# Patient Record
Sex: Female | Born: 1965
Health system: Southern US, Community
[De-identification: ages and names within clinical notes are randomized; demographics above are authoritative.]

## PROBLEM LIST (undated history)

## (undated) DIAGNOSIS — Z87440 Personal history of urinary (tract) infections: Secondary | ICD-10-CM

## (undated) DIAGNOSIS — M1710 Unilateral primary osteoarthritis, unspecified knee: Secondary | ICD-10-CM

## (undated) DIAGNOSIS — E785 Hyperlipidemia, unspecified: Secondary | ICD-10-CM

## (undated) DIAGNOSIS — E669 Obesity, unspecified: Secondary | ICD-10-CM

## (undated) DIAGNOSIS — M179 Osteoarthritis of knee, unspecified: Secondary | ICD-10-CM

## (undated) DIAGNOSIS — E739 Lactose intolerance, unspecified: Secondary | ICD-10-CM

## (undated) DIAGNOSIS — K219 Gastro-esophageal reflux disease without esophagitis: Secondary | ICD-10-CM

## (undated) DIAGNOSIS — D649 Anemia, unspecified: Secondary | ICD-10-CM

## (undated) DIAGNOSIS — F329 Major depressive disorder, single episode, unspecified: Secondary | ICD-10-CM

## (undated) DIAGNOSIS — F32A Depression, unspecified: Secondary | ICD-10-CM

## (undated) HISTORY — DX: Unilateral primary osteoarthritis, unspecified knee: M17.10

## (undated) HISTORY — DX: Lactose intolerance, unspecified: E73.9

## (undated) HISTORY — PX: FOOT SURGERY: SHX648

## (undated) HISTORY — PX: WISDOM TOOTH EXTRACTION: SHX21

## (undated) HISTORY — DX: Obesity, unspecified: E66.9

## (undated) HISTORY — PX: BREAST BIOPSY: SHX20

## (undated) HISTORY — DX: Gastro-esophageal reflux disease without esophagitis: K21.9

## (undated) HISTORY — DX: Osteoarthritis of knee, unspecified: M17.9

## (undated) HISTORY — DX: Hyperlipidemia, unspecified: E78.5

## (undated) HISTORY — DX: Depression, unspecified: F32.A

## (undated) HISTORY — DX: Major depressive disorder, single episode, unspecified: F32.9

---

## 1999-03-12 ENCOUNTER — Other Ambulatory Visit: Admission: RE | Admit: 1999-03-12 | Discharge: 1999-03-12 | Payer: Self-pay | Admitting: Obstetrics & Gynecology

## 2000-03-18 ENCOUNTER — Other Ambulatory Visit: Admission: RE | Admit: 2000-03-18 | Discharge: 2000-03-18 | Payer: Self-pay | Admitting: Obstetrics & Gynecology

## 2000-06-18 ENCOUNTER — Other Ambulatory Visit: Admission: RE | Admit: 2000-06-18 | Discharge: 2000-06-18 | Payer: Self-pay | Admitting: Obstetrics & Gynecology

## 2000-07-22 ENCOUNTER — Other Ambulatory Visit: Admission: RE | Admit: 2000-07-22 | Discharge: 2000-07-22 | Payer: Self-pay | Admitting: Obstetrics & Gynecology

## 2000-07-22 ENCOUNTER — Encounter (INDEPENDENT_AMBULATORY_CARE_PROVIDER_SITE_OTHER): Payer: Self-pay | Admitting: Specialist

## 2000-09-04 ENCOUNTER — Encounter: Admission: RE | Admit: 2000-09-04 | Discharge: 2000-09-04 | Payer: Self-pay | Admitting: Family Medicine

## 2000-09-04 ENCOUNTER — Encounter: Payer: Self-pay | Admitting: Family Medicine

## 2000-11-10 ENCOUNTER — Other Ambulatory Visit: Admission: RE | Admit: 2000-11-10 | Discharge: 2000-11-10 | Payer: Self-pay | Admitting: Obstetrics & Gynecology

## 2001-01-02 ENCOUNTER — Encounter (INDEPENDENT_AMBULATORY_CARE_PROVIDER_SITE_OTHER): Payer: Self-pay

## 2001-01-02 ENCOUNTER — Ambulatory Visit (HOSPITAL_COMMUNITY): Admission: RE | Admit: 2001-01-02 | Discharge: 2001-01-02 | Payer: Self-pay | Admitting: Obstetrics & Gynecology

## 2001-03-25 ENCOUNTER — Other Ambulatory Visit: Admission: RE | Admit: 2001-03-25 | Discharge: 2001-03-25 | Payer: Self-pay | Admitting: Obstetrics & Gynecology

## 2001-09-19 ENCOUNTER — Encounter: Payer: Self-pay | Admitting: Emergency Medicine

## 2001-09-19 ENCOUNTER — Emergency Department (HOSPITAL_COMMUNITY): Admission: EM | Admit: 2001-09-19 | Discharge: 2001-09-19 | Payer: Self-pay | Admitting: Emergency Medicine

## 2001-09-28 ENCOUNTER — Encounter: Admission: RE | Admit: 2001-09-28 | Discharge: 2001-10-22 | Payer: Self-pay | Admitting: Orthopedic Surgery

## 2001-10-24 ENCOUNTER — Ambulatory Visit: Admission: RE | Admit: 2001-10-24 | Discharge: 2001-10-24 | Payer: Self-pay | Admitting: Orthopedic Surgery

## 2001-10-24 ENCOUNTER — Encounter: Payer: Self-pay | Admitting: Orthopedic Surgery

## 2001-11-11 ENCOUNTER — Other Ambulatory Visit: Admission: RE | Admit: 2001-11-11 | Discharge: 2001-11-11 | Payer: Self-pay | Admitting: Obstetrics & Gynecology

## 2002-01-25 ENCOUNTER — Encounter: Payer: Self-pay | Admitting: Internal Medicine

## 2002-01-25 ENCOUNTER — Emergency Department (HOSPITAL_COMMUNITY): Admission: EM | Admit: 2002-01-25 | Discharge: 2002-01-25 | Payer: Self-pay | Admitting: Emergency Medicine

## 2002-03-05 ENCOUNTER — Encounter: Payer: Self-pay | Admitting: *Deleted

## 2002-03-05 ENCOUNTER — Ambulatory Visit (HOSPITAL_COMMUNITY): Admission: RE | Admit: 2002-03-05 | Discharge: 2002-03-05 | Payer: Self-pay | Admitting: *Deleted

## 2002-05-03 ENCOUNTER — Encounter: Payer: Self-pay | Admitting: Internal Medicine

## 2002-05-03 ENCOUNTER — Ambulatory Visit (HOSPITAL_COMMUNITY): Admission: RE | Admit: 2002-05-03 | Discharge: 2002-05-03 | Payer: Self-pay | Admitting: Internal Medicine

## 2002-05-03 ENCOUNTER — Encounter: Admission: RE | Admit: 2002-05-03 | Discharge: 2002-05-03 | Payer: Self-pay | Admitting: Internal Medicine

## 2002-05-26 ENCOUNTER — Ambulatory Visit (HOSPITAL_COMMUNITY): Admission: RE | Admit: 2002-05-26 | Discharge: 2002-05-26 | Payer: Self-pay | Admitting: *Deleted

## 2002-07-28 ENCOUNTER — Ambulatory Visit (HOSPITAL_COMMUNITY): Admission: RE | Admit: 2002-07-28 | Discharge: 2002-07-28 | Payer: Self-pay | Admitting: *Deleted

## 2002-09-13 ENCOUNTER — Ambulatory Visit (HOSPITAL_COMMUNITY): Admission: RE | Admit: 2002-09-13 | Discharge: 2002-09-13 | Payer: Self-pay | Admitting: *Deleted

## 2002-12-08 ENCOUNTER — Ambulatory Visit (HOSPITAL_COMMUNITY): Admission: RE | Admit: 2002-12-08 | Discharge: 2002-12-08 | Payer: Self-pay | Admitting: *Deleted

## 2003-01-03 ENCOUNTER — Encounter: Admission: RE | Admit: 2003-01-03 | Discharge: 2003-01-03 | Payer: Self-pay | Admitting: *Deleted

## 2003-01-09 ENCOUNTER — Inpatient Hospital Stay (HOSPITAL_COMMUNITY): Admission: AD | Admit: 2003-01-09 | Discharge: 2003-01-12 | Payer: Self-pay | Admitting: Obstetrics and Gynecology

## 2003-02-22 ENCOUNTER — Encounter: Admission: RE | Admit: 2003-02-22 | Discharge: 2003-02-22 | Payer: Self-pay | Admitting: Obstetrics and Gynecology

## 2003-02-24 ENCOUNTER — Encounter: Admission: RE | Admit: 2003-02-24 | Discharge: 2003-02-24 | Payer: Self-pay | Admitting: Obstetrics and Gynecology

## 2003-02-28 ENCOUNTER — Ambulatory Visit (HOSPITAL_COMMUNITY): Admission: RE | Admit: 2003-02-28 | Discharge: 2003-02-28 | Payer: Self-pay | Admitting: Obstetrics and Gynecology

## 2003-03-31 ENCOUNTER — Encounter: Admission: RE | Admit: 2003-03-31 | Discharge: 2003-03-31 | Payer: Self-pay | Admitting: Obstetrics and Gynecology

## 2003-08-22 ENCOUNTER — Encounter: Admission: RE | Admit: 2003-08-22 | Discharge: 2003-08-22 | Payer: Self-pay | Admitting: Internal Medicine

## 2003-08-29 ENCOUNTER — Encounter: Admission: RE | Admit: 2003-08-29 | Discharge: 2003-08-29 | Payer: Self-pay | Admitting: Internal Medicine

## 2003-09-05 ENCOUNTER — Ambulatory Visit (HOSPITAL_COMMUNITY): Admission: RE | Admit: 2003-09-05 | Discharge: 2003-09-05 | Payer: Self-pay | Admitting: Internal Medicine

## 2003-09-05 ENCOUNTER — Encounter: Admission: RE | Admit: 2003-09-05 | Discharge: 2003-09-05 | Payer: Self-pay | Admitting: Internal Medicine

## 2003-09-05 IMAGING — CR DG KNEE 1-2V*L*
2 series · 2 of 2 positions shown · non-contrast
Comparison: none

CLINICAL DATA: 38 year old with pain in the left knee.  
 LEFT KNEE, TWO VIEWS
 No priors.
 Two views are performed, showing mild degenerative change, affecting the medial, lateral and patellofemoral compartments.  There is no evidence for acute fracture or dislocation.  No joint effusion is noted.

[view not recorded (1 of 2)]
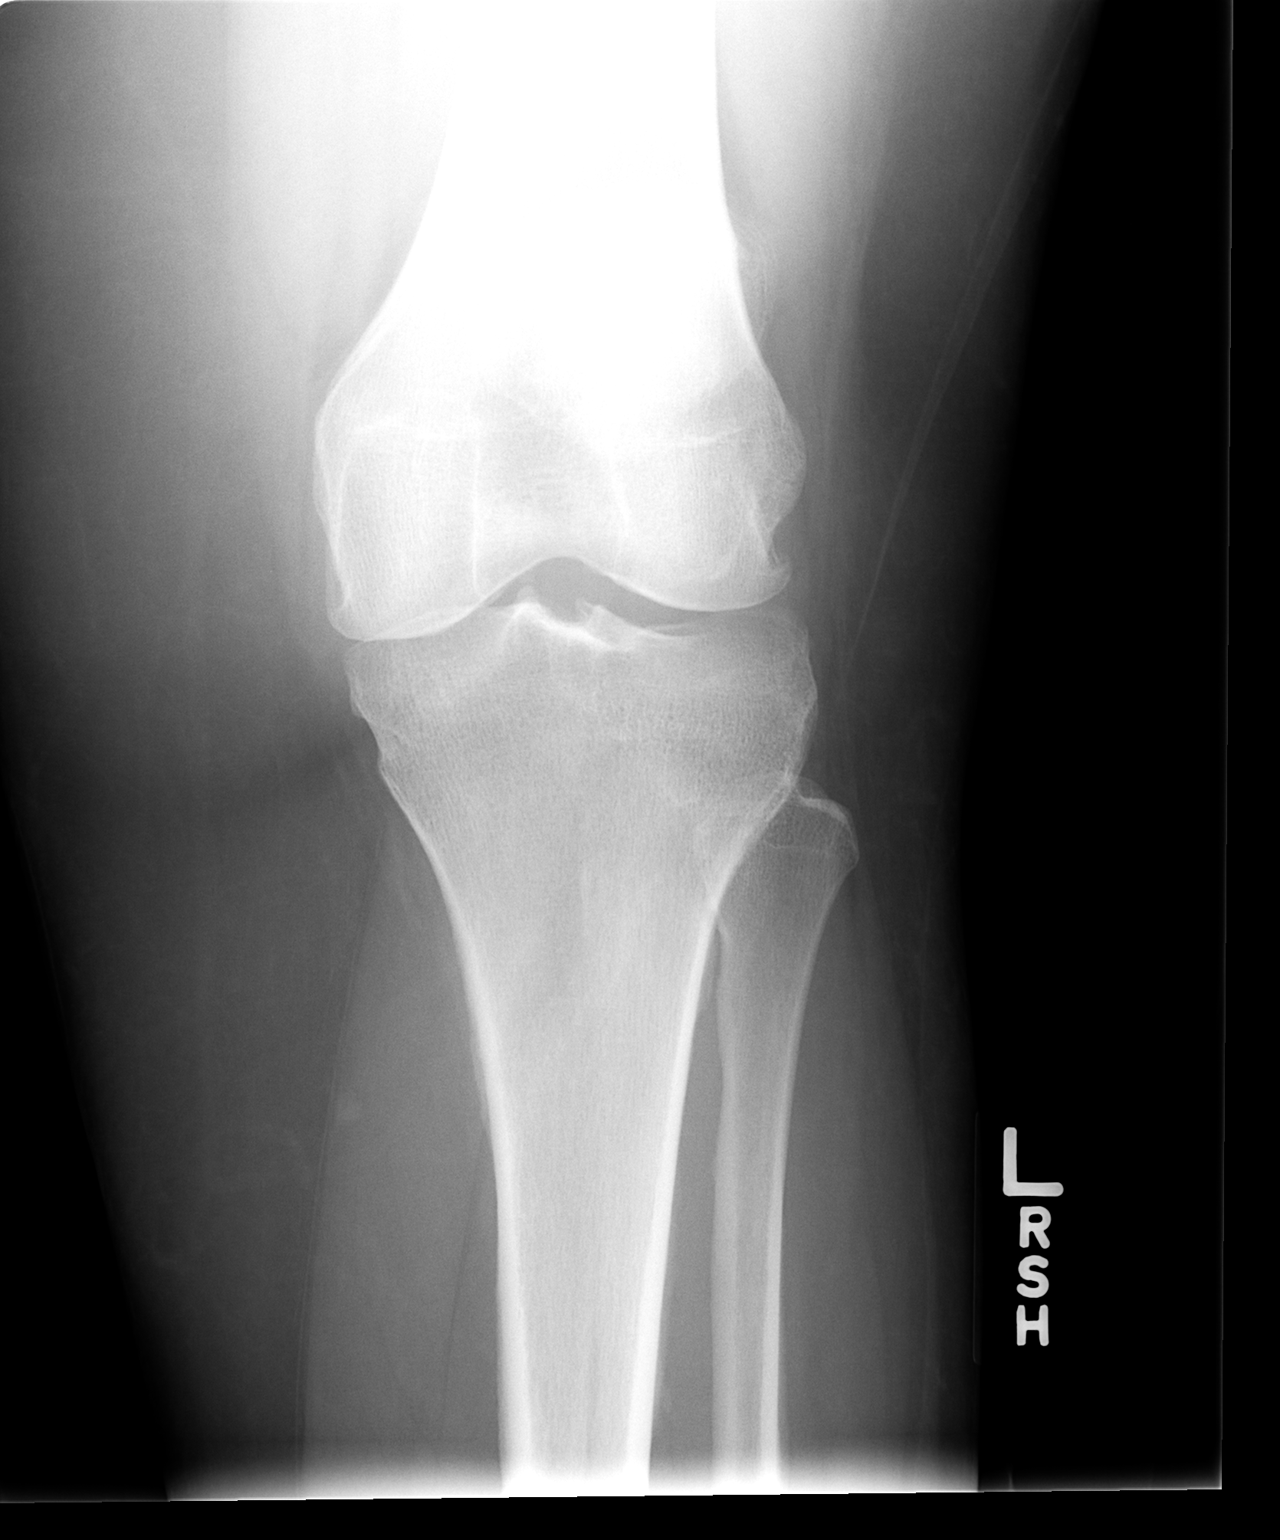

[view not recorded (2 of 2)]
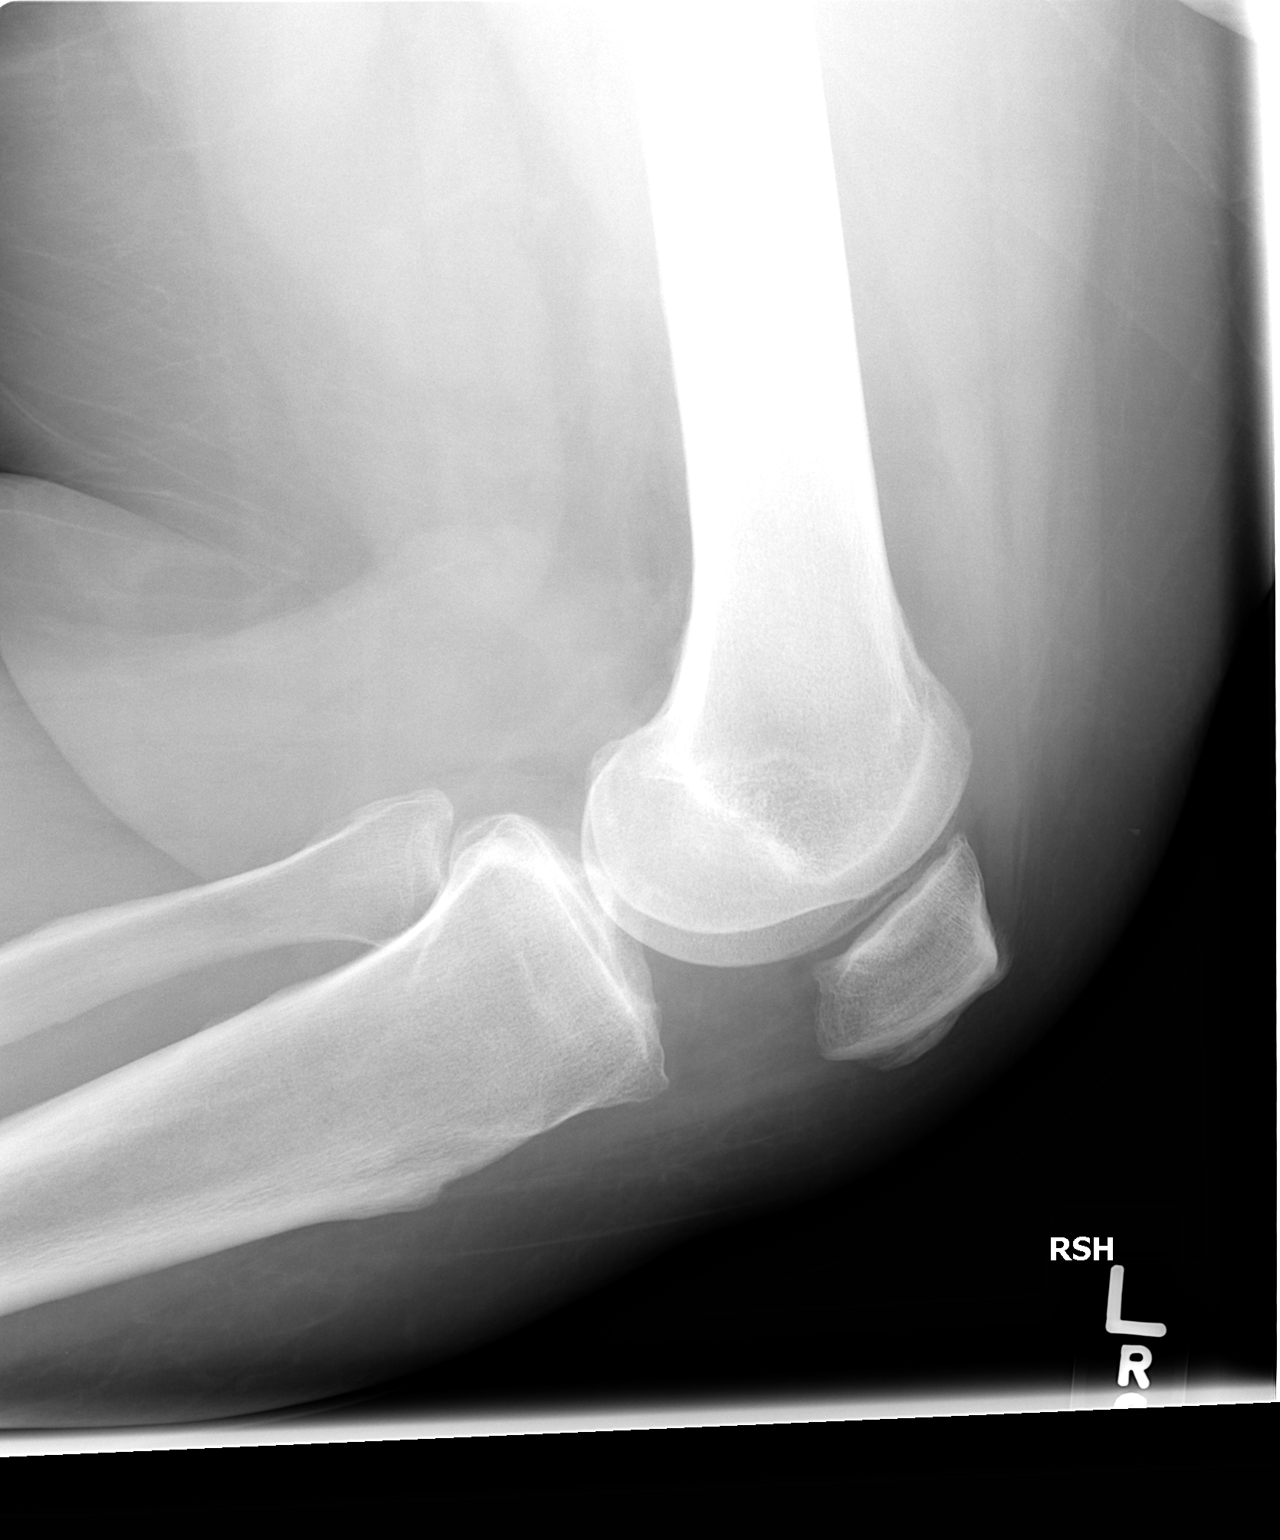

[2 of 2 positions shown; findings below may reference images not displayed]

IMPRESSION: Mild degenerative change without evidence for acute abnormality.

## 2003-09-19 ENCOUNTER — Encounter: Admission: RE | Admit: 2003-09-19 | Discharge: 2003-09-19 | Payer: Self-pay | Admitting: Internal Medicine

## 2003-09-27 ENCOUNTER — Encounter: Admission: RE | Admit: 2003-09-27 | Discharge: 2003-10-20 | Payer: Self-pay | Admitting: Internal Medicine

## 2003-11-10 ENCOUNTER — Encounter: Admission: RE | Admit: 2003-11-10 | Discharge: 2003-11-10 | Payer: Self-pay | Admitting: Internal Medicine

## 2003-11-11 ENCOUNTER — Encounter (INDEPENDENT_AMBULATORY_CARE_PROVIDER_SITE_OTHER): Payer: Self-pay | Admitting: *Deleted

## 2004-03-06 ENCOUNTER — Ambulatory Visit: Payer: Self-pay | Admitting: *Deleted

## 2005-01-09 ENCOUNTER — Ambulatory Visit: Payer: Self-pay | Admitting: Internal Medicine

## 2005-03-13 ENCOUNTER — Other Ambulatory Visit: Admission: RE | Admit: 2005-03-13 | Discharge: 2005-03-13 | Payer: Self-pay | Admitting: Obstetrics & Gynecology

## 2005-04-15 ENCOUNTER — Ambulatory Visit: Payer: Self-pay | Admitting: Internal Medicine

## 2005-05-29 ENCOUNTER — Ambulatory Visit: Payer: Self-pay | Admitting: Internal Medicine

## 2005-11-13 ENCOUNTER — Ambulatory Visit: Payer: Self-pay | Admitting: Internal Medicine

## 2005-12-05 ENCOUNTER — Emergency Department (HOSPITAL_COMMUNITY): Admission: EM | Admit: 2005-12-05 | Discharge: 2005-12-05 | Payer: Self-pay | Admitting: Emergency Medicine
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
## 2005-12-09 ENCOUNTER — Ambulatory Visit: Payer: Self-pay | Admitting: Internal Medicine

## 2005-12-18 ENCOUNTER — Ambulatory Visit: Payer: Self-pay | Admitting: Internal Medicine

## 2006-01-29 ENCOUNTER — Ambulatory Visit: Payer: Self-pay | Admitting: Internal Medicine

## 2006-02-19 ENCOUNTER — Ambulatory Visit: Payer: Self-pay | Admitting: Internal Medicine

## 2006-03-27 ENCOUNTER — Ambulatory Visit: Payer: Self-pay | Admitting: Internal Medicine

## 2006-04-17 ENCOUNTER — Ambulatory Visit: Payer: Self-pay | Admitting: Internal Medicine

## 2006-06-09 ENCOUNTER — Ambulatory Visit: Payer: Self-pay | Admitting: Internal Medicine

## 2006-06-25 ENCOUNTER — Encounter (INDEPENDENT_AMBULATORY_CARE_PROVIDER_SITE_OTHER): Payer: Self-pay | Admitting: *Deleted

## 2006-06-25 DIAGNOSIS — F329 Major depressive disorder, single episode, unspecified: Secondary | ICD-10-CM | POA: Insufficient documentation

## 2006-06-25 DIAGNOSIS — E785 Hyperlipidemia, unspecified: Secondary | ICD-10-CM | POA: Insufficient documentation

## 2006-06-25 DIAGNOSIS — E669 Obesity, unspecified: Secondary | ICD-10-CM | POA: Insufficient documentation

## 2006-06-25 DIAGNOSIS — M1990 Unspecified osteoarthritis, unspecified site: Secondary | ICD-10-CM | POA: Insufficient documentation

## 2006-06-25 DIAGNOSIS — F17200 Nicotine dependence, unspecified, uncomplicated: Secondary | ICD-10-CM | POA: Insufficient documentation

## 2006-07-11 ENCOUNTER — Ambulatory Visit: Payer: Self-pay | Admitting: Internal Medicine

## 2006-07-11 LAB — CONVERTED CEMR LAB
AST: 15 units/L (ref 0–37)
Albumin: 3.5 g/dL (ref 3.5–5.2)
Basophils Absolute: 0 10*3/uL (ref 0.0–0.1)
CO2: 27 meq/L (ref 19–32)
Chloride: 110 meq/L (ref 96–112)
Creatinine, Ser: 0.6 mg/dL (ref 0.4–1.2)
Eosinophils Absolute: 0.2 10*3/uL (ref 0.0–0.6)
Eosinophils Relative: 2.6 % (ref 0.0–5.0)
GFR calc Af Amer: 142 mL/min
Glucose, Bld: 90 mg/dL (ref 70–99)
HCT: 36.1 % (ref 36.0–46.0)
HDL: 55.5 mg/dL (ref >39.0)
Hemoglobin, Urine: NEGATIVE
Hemoglobin: 12.5 g/dL (ref 12.0–15.0)
LDL Cholesterol: 128 mg/dL — ABNORMAL HIGH (ref 0–99)
MCHC: 34.5 g/dL (ref 30.0–36.0)
MCV: 89.4 fL (ref 78.0–100.0)
Monocytes Absolute: 0.5 10*3/uL (ref 0.2–0.7)
Monocytes Relative: 7.4 % (ref 3.0–11.0)
Nitrite: NEGATIVE
RBC: 4.04 M/uL (ref 3.87–5.11)
RDW: 13.8 % (ref 11.5–14.6)
Total Bilirubin: 0.7 mg/dL (ref 0.3–1.2)
Total Protein, Urine: NEGATIVE mg/dL
Triglycerides: 68 mg/dL (ref 0–149)
VLDL: 14 mg/dL (ref 0–40)
WBC: 6.1 10*3/uL (ref 4.5–10.5)

## 2006-07-16 ENCOUNTER — Ambulatory Visit: Payer: Self-pay | Admitting: Internal Medicine

## 2006-10-01 IMAGING — US MAMMO-LUNI-US
1 series · 6 of 6 positions shown · non-contrast
Comparison: NONE

CLINICAL DATA: GIAMPIERRE, RT(R)(M)   
Diagnostic  Mammogram.  Six-month follow-up. 

LEFT BREAST MAMMOGRAM AND LEFT BREAST ULTRASOUND

[Series 1: us breast · 0.07mm/px · 6 of 6 slices shown]
[im 1/6]
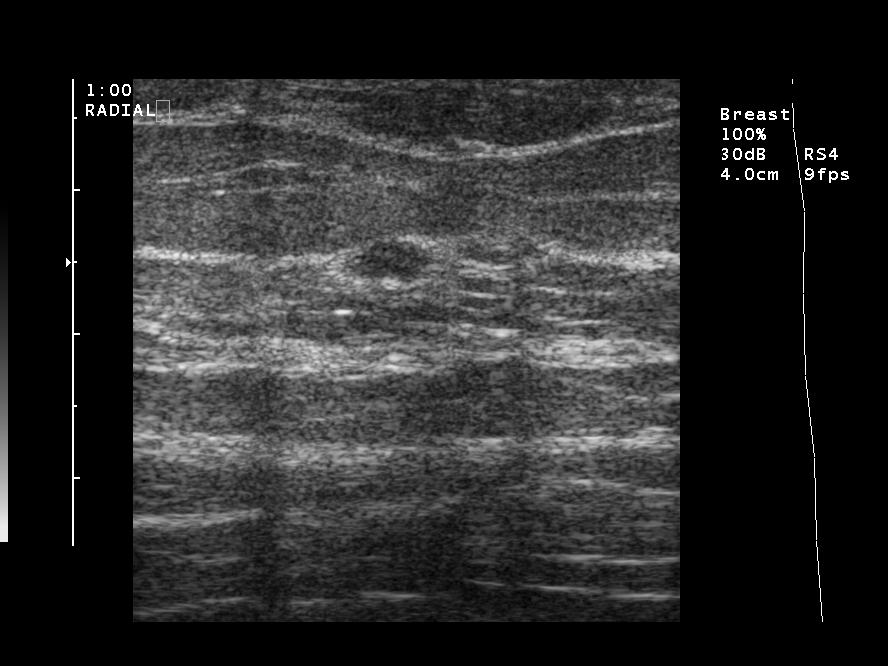
[im 2/6]
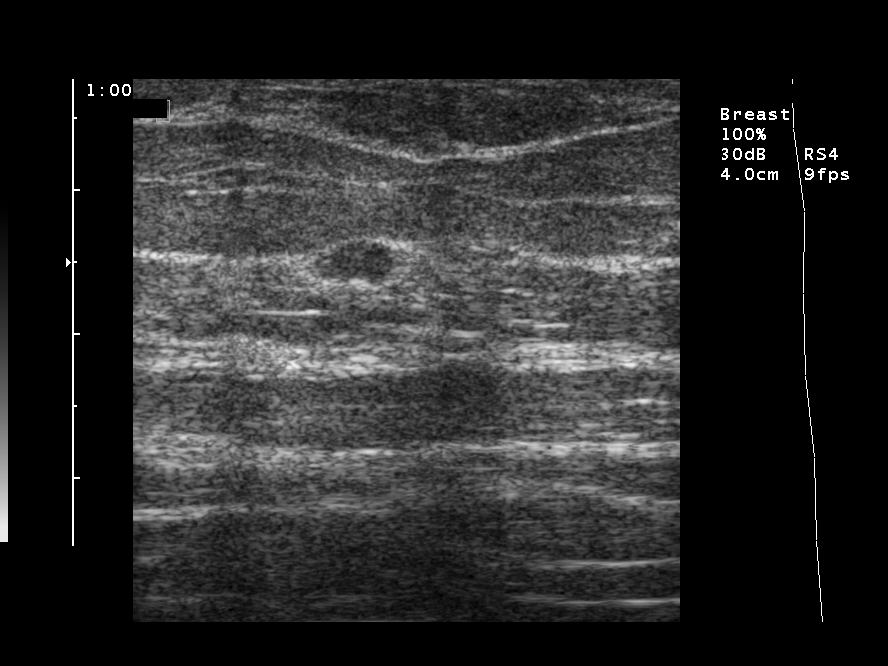
[im 3/6]
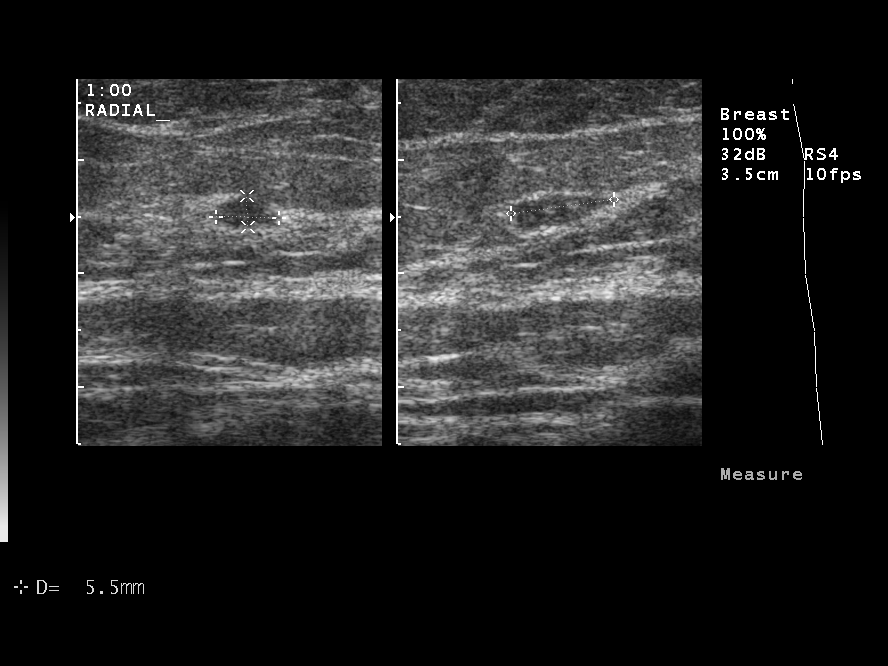
[im 4/6]
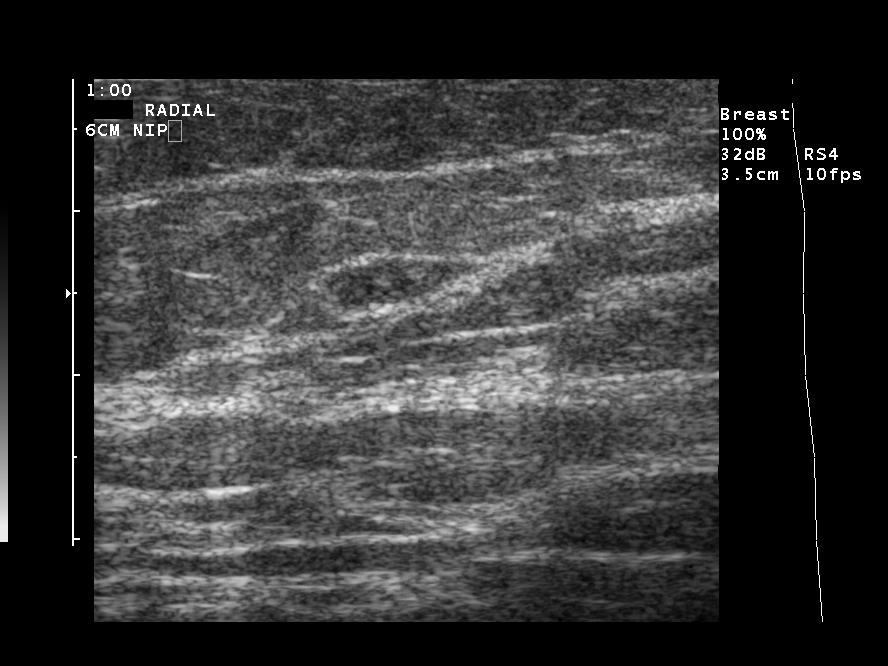
[im 5/6]
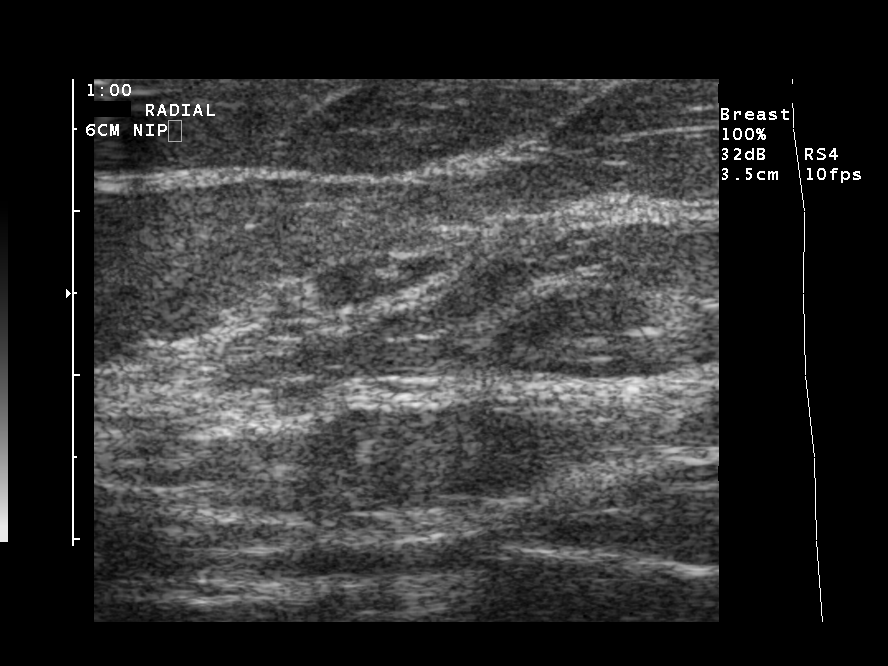
[im 6/6]
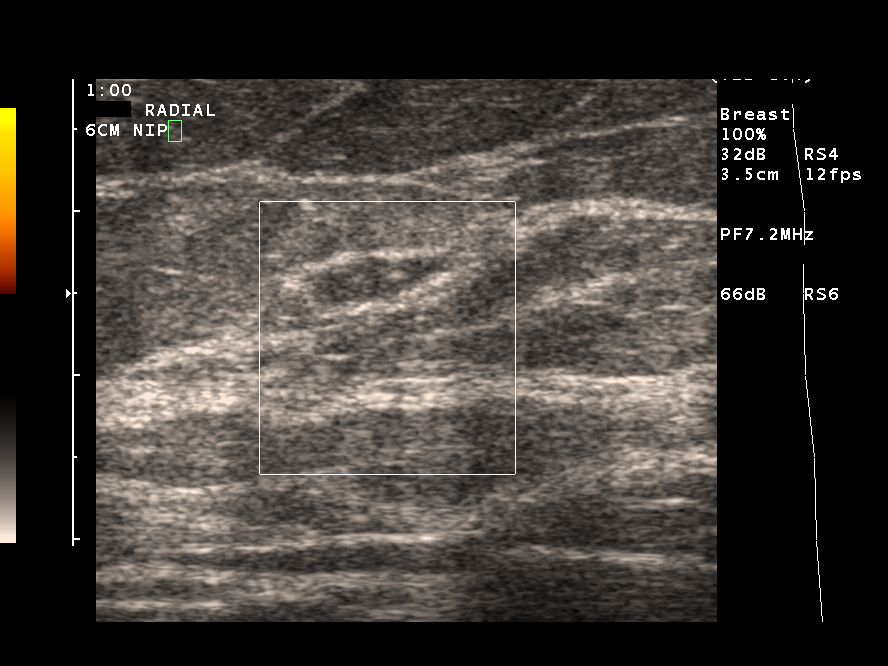

[6 of 6 positions shown; findings below may reference images not displayed]

FINDINGS: There is a prior mammogram dated [DATE] for 
comparison.  There is moderate parenchymal breast density.  There 
is a mass seen in the upper outer quadrant of the left breast.  
Margins are partially obscured.  This does not appear to be 
significantly changed compared to the prior mammogram.  Benign 
appearing calcifications are seen. Ultrasound was performed at the 
[DATE] position of the left breast.  There is an area located 6 cm 
from the nipple that is hypoechoic.  This is oval in shape and 
measures 6 x 3 x 9 mm.  There is a previous ultrasound dated 
[DATE].  At that time the lesion measured 9 x 5 x 5 mm.  There 
is no significant change in size of the mass.  There is no 
acoustic enhancement or shadowing.  This has a well circumscribed 
margin that is parallel to the skin surface.
IMPRESSION: No mammographic or ultrasonographic evidence of 
malignancy. I recommend six-month interval follow-up left breast 
mammogram and left breast ultrasound to re-evaluate the findings. 
The patient was informed at the time of the examination of the 
findings and recommendations by verbal and written lay report. 
Computer assisted (Second Look) technology was used as an aid in 
interpretation of this study. BI-RADS 3 - Probably Benign GIAMPIERRE 
[DATE]  Trans Date: [DATE] JH  GIAMPIERRE

## 2007-01-03 ENCOUNTER — Encounter: Payer: Self-pay | Admitting: Internal Medicine

## 2007-01-03 DIAGNOSIS — K219 Gastro-esophageal reflux disease without esophagitis: Secondary | ICD-10-CM | POA: Insufficient documentation

## 2007-01-03 DIAGNOSIS — J309 Allergic rhinitis, unspecified: Secondary | ICD-10-CM | POA: Insufficient documentation

## 2007-03-11 ENCOUNTER — Encounter: Payer: Self-pay | Admitting: Internal Medicine

## 2007-03-11 ENCOUNTER — Ambulatory Visit: Payer: Self-pay | Admitting: Internal Medicine

## 2007-03-11 DIAGNOSIS — R209 Unspecified disturbances of skin sensation: Secondary | ICD-10-CM | POA: Insufficient documentation

## 2007-03-11 DIAGNOSIS — F411 Generalized anxiety disorder: Secondary | ICD-10-CM | POA: Insufficient documentation

## 2007-03-12 ENCOUNTER — Ambulatory Visit: Payer: Self-pay | Admitting: Internal Medicine

## 2007-03-12 LAB — CONVERTED CEMR LAB
Albumin: 3.6 g/dL (ref 3.5–5.2)
Alkaline Phosphatase: 69 units/L (ref 39–117)
BUN: 7 mg/dL (ref 6–23)
Bilirubin Urine: NEGATIVE
CO2: 25 meq/L (ref 19–32)
Cholesterol: 195 mg/dL (ref 0–200)
Creatinine, Ser: 0.8 mg/dL (ref 0.4–1.2)
Eosinophils Relative: 2.7 % (ref 0.0–5.0)
GFR calc Af Amer: 102 mL/min
GFR calc non Af Amer: 84 mL/min
HCT: 37.4 % (ref 36.0–46.0)
HDL: 53.4 mg/dL (ref >39.0)
Ketones, ur: NEGATIVE mg/dL
LDL Cholesterol: 128 mg/dL — ABNORMAL HIGH (ref 0–99)
Lymphocytes Relative: 42.3 % (ref 12.0–46.0)
MCHC: 33.5 g/dL (ref 30.0–36.0)
Monocytes Relative: 8.6 % (ref 3.0–11.0)
Neutrophils Relative %: 45.8 % (ref 43.0–77.0)
Platelets: 226 10*3/uL (ref 150–400)
Specific Gravity, Urine: >=1.03 (ref 1.000–1.03)
Total Bilirubin: 0.9 mg/dL (ref 0.3–1.2)
Triglycerides: 66 mg/dL (ref 0–149)
Urine Glucose: NEGATIVE mg/dL
Urobilinogen, UA: 0.2 (ref 0.0–1.0)
WBC: 6.4 10*3/uL (ref 4.5–10.5)
pH: 5.5 (ref 5.0–8.0)

## 2007-03-18 IMAGING — US MAMMO-BILAT-US
1 series · 5 of 5 positions shown · non-contrast
Comparison: NONE

CLINICAL DATA: BARNHILL RT(R)(M)(CT)   Diagnostic 
Mammogram.   Six-month follow-up. 

BILATERAL MAMMOGRAM AND LEFT BREAST ULTRASOUND

[Series 1: us breast · 0.07mm/px · 5 of 5 slices shown]
[im 1/5]
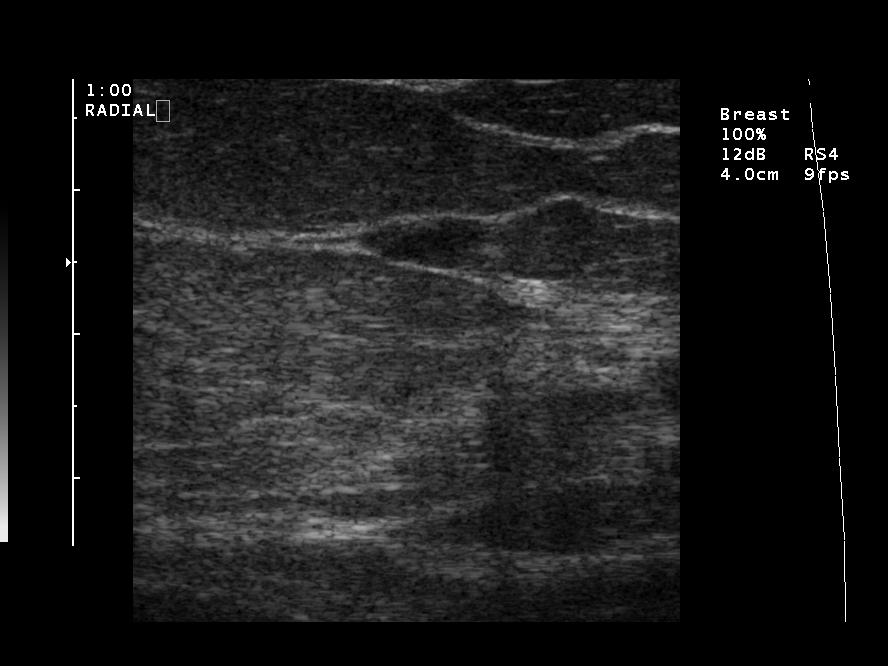
[im 2/5]
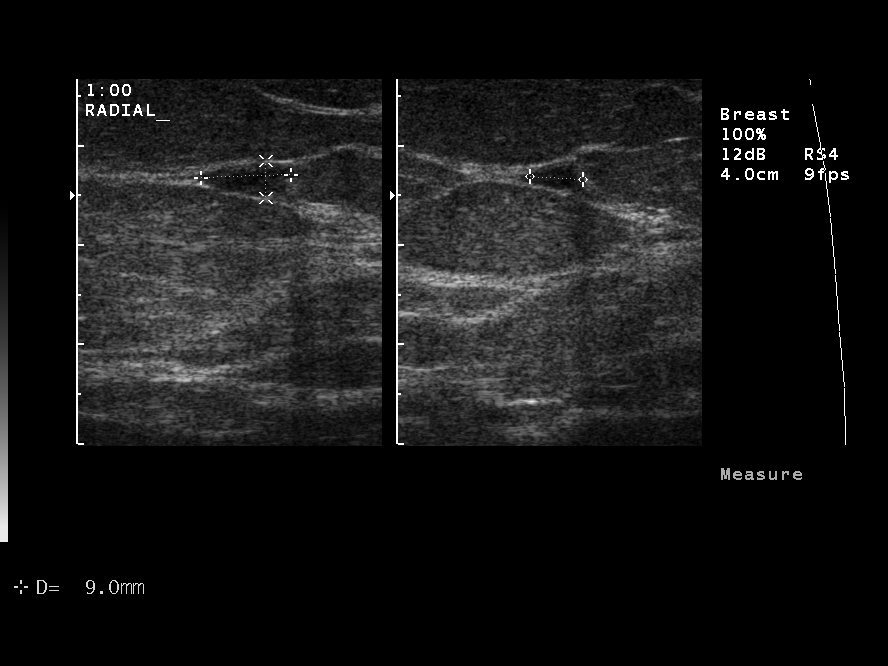
[im 3/5]
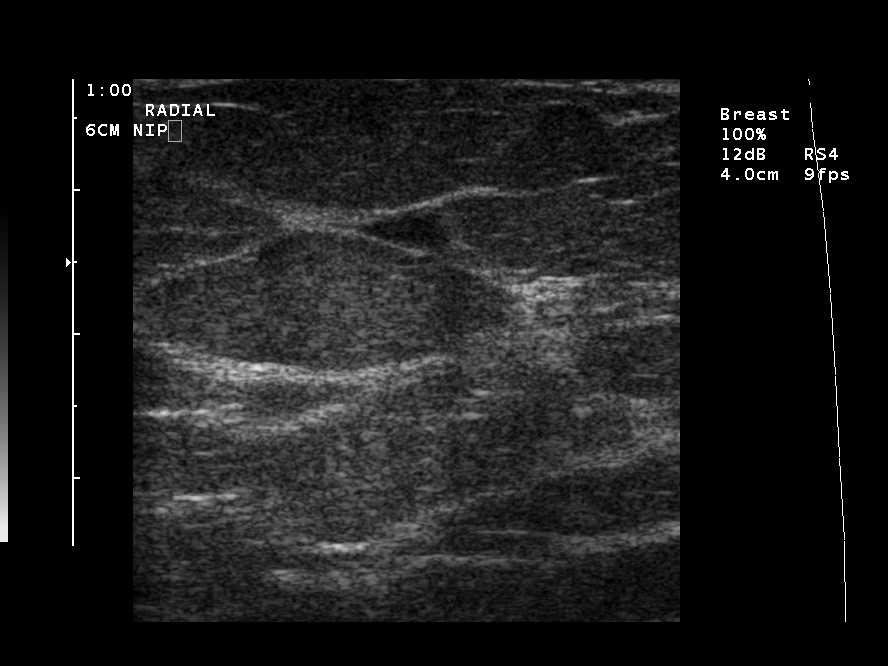
[im 4/5]
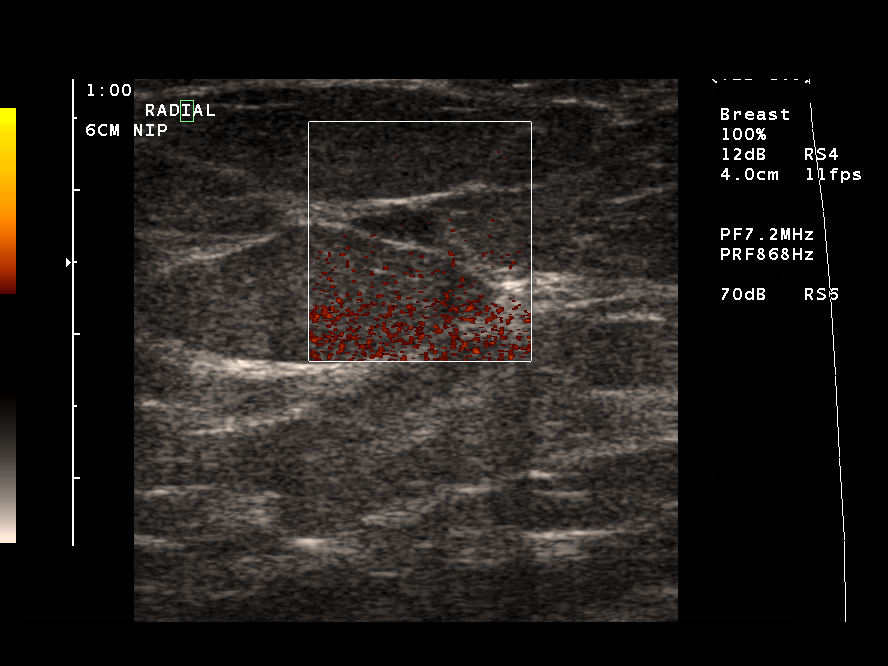
[im 5/5]
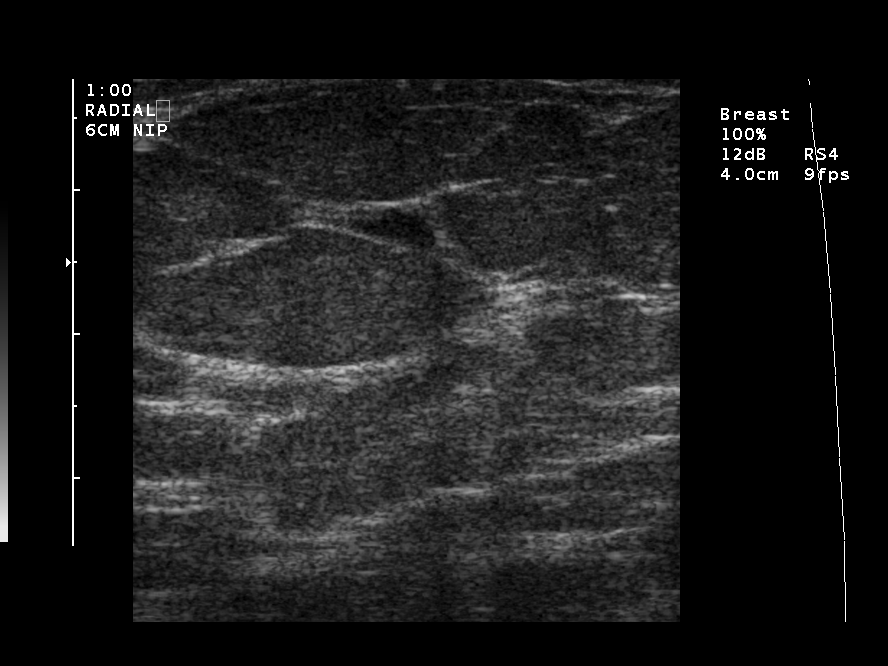

[5 of 5 positions shown; findings below may reference images not displayed]

FINDINGS: Bilateral mammogram is compared with left breast 
images dated [DATE] and bilateral study dated [DATE].  
Moderate glandular pattern is stable bilaterally.  Previously 
demonstrated small nodular density in the left breast at 
approximately the 1 o???clock position is unchanged in appearance.  
There are no suspicious features mammographically. Ultrasound 
re-evaluation of the left breast nodule noted in the 1 o???clock 
position, 6 cm from the nipple demonstrates a stable hypoechoic 
horizontally oriented nodule measuring 0.9 x 0.4 x 0.5 cm.  
Ultrasound echo appearances are consistent with a benign nodule.
IMPRESSION: Stable mammogram and left breast ultrasound 
demonstrating benign appearing nodule on the left.  Annual 
mammography is now recommended. The patient was informed at the 
time of the examination of the findings and recommendations by 
verbal and written lay report. Computer assisted (Second Look) 
technology was used as an aid in interpretation of this study. 
BI-RADS 2- Benign BARNHILL, M.D. electronically reviewed 
on [DATE] Dict Date: [DATE]  Tran Date: [DATE] CAV  
BARNHILL

## 2007-04-01 ENCOUNTER — Telehealth: Payer: Self-pay | Admitting: Internal Medicine

## 2007-06-22 ENCOUNTER — Telehealth (INDEPENDENT_AMBULATORY_CARE_PROVIDER_SITE_OTHER): Payer: Self-pay | Admitting: *Deleted

## 2007-06-24 ENCOUNTER — Ambulatory Visit: Payer: Self-pay | Admitting: Internal Medicine

## 2007-06-24 DIAGNOSIS — M25569 Pain in unspecified knee: Secondary | ICD-10-CM | POA: Insufficient documentation

## 2007-08-05 ENCOUNTER — Encounter: Payer: Self-pay | Admitting: Internal Medicine

## 2008-01-08 ENCOUNTER — Ambulatory Visit: Payer: Self-pay | Admitting: Internal Medicine

## 2008-01-08 DIAGNOSIS — J019 Acute sinusitis, unspecified: Secondary | ICD-10-CM | POA: Insufficient documentation

## 2008-01-29 ENCOUNTER — Telehealth: Payer: Self-pay | Admitting: Internal Medicine

## 2008-01-29 DIAGNOSIS — R220 Localized swelling, mass and lump, head: Secondary | ICD-10-CM | POA: Insufficient documentation

## 2008-01-29 DIAGNOSIS — R221 Localized swelling, mass and lump, neck: Secondary | ICD-10-CM

## 2008-02-19 ENCOUNTER — Encounter: Payer: Self-pay | Admitting: Internal Medicine

## 2008-03-14 ENCOUNTER — Ambulatory Visit: Payer: Self-pay | Admitting: Internal Medicine

## 2008-03-14 LAB — CONVERTED CEMR LAB
AST: 16 units/L (ref 0–37)
Albumin: 3.6 g/dL (ref 3.5–5.2)
Bilirubin Urine: NEGATIVE
Bilirubin, Direct: 0.2 mg/dL (ref 0.0–0.3)
CO2: 26 meq/L (ref 19–32)
Chloride: 108 meq/L (ref 96–112)
Cholesterol: 209 mg/dL (ref 0–200)
Creatinine, Ser: 0.7 mg/dL (ref 0.4–1.2)
Direct LDL: 130.5 mg/dL
Eosinophils Relative: 2.7 % (ref 0.0–5.0)
GFR calc non Af Amer: 98 mL/min
Glucose, Bld: 81 mg/dL (ref 70–99)
HCT: 35.3 % — ABNORMAL LOW (ref 36.0–46.0)
HDL: 58.1 mg/dL (ref >39.0)
Hemoglobin, Urine: NEGATIVE
Lymphocytes Relative: 33 % (ref 12.0–46.0)
MCHC: 34.9 g/dL (ref 30.0–36.0)
MCV: 90.2 fL (ref 78.0–100.0)
Monocytes Relative: 7.1 % (ref 3.0–12.0)
Nitrite: NEGATIVE
Platelets: 210 10*3/uL (ref 150–400)
Potassium: 4.2 meq/L (ref 3.5–5.1)
RBC: 3.91 M/uL (ref 3.87–5.11)
Specific Gravity, Urine: >=1.03 (ref 1.000–1.03)
Total CHOL/HDL Ratio: 3.6
Total Protein: 6.9 g/dL (ref 6.0–8.3)
Triglycerides: 74 mg/dL (ref 0–149)
Urobilinogen, UA: 0.2 (ref 0.0–1.0)
VLDL: 15 mg/dL (ref 0–40)
pH: 5 (ref 5.0–8.0)

## 2008-03-18 ENCOUNTER — Ambulatory Visit: Payer: Self-pay | Admitting: Internal Medicine

## 2009-01-10 ENCOUNTER — Telehealth (INDEPENDENT_AMBULATORY_CARE_PROVIDER_SITE_OTHER): Payer: Self-pay | Admitting: *Deleted

## 2009-03-27 ENCOUNTER — Ambulatory Visit: Payer: Self-pay | Admitting: Internal Medicine

## 2009-03-27 LAB — CONVERTED CEMR LAB
AST: 14 units/L (ref 0–37)
Albumin: 3.7 g/dL (ref 3.5–5.2)
Alkaline Phosphatase: 66 units/L (ref 39–117)
BUN: 11 mg/dL (ref 6–23)
Bilirubin Urine: NEGATIVE
Cholesterol: 200 mg/dL (ref 0–200)
GFR calc non Af Amer: 117.13 mL/min (ref >60)
Hemoglobin: 12.8 g/dL (ref 12.0–15.0)
Ketones, ur: NEGATIVE mg/dL
Leukocytes, UA: NEGATIVE
Monocytes Relative: 7.1 % (ref 3.0–12.0)
Neutrophils Relative %: 46.1 % (ref 43.0–77.0)
Nitrite: NEGATIVE
Potassium: 3.8 meq/L (ref 3.5–5.1)
Sodium: 143 meq/L (ref 135–145)
Specific Gravity, Urine: >=1.03 (ref 1.000–1.030)
TSH: 1.89 microintl units/mL (ref 0.35–5.50)
Total Protein, Urine: NEGATIVE mg/dL
Triglycerides: 76 mg/dL (ref 0.0–149.0)
Urine Glucose: NEGATIVE mg/dL
pH: 6 (ref 5.0–8.0)

## 2009-03-30 ENCOUNTER — Encounter: Admission: RE | Admit: 2009-03-30 | Discharge: 2009-03-30 | Payer: Self-pay | Admitting: Obstetrics & Gynecology

## 2009-03-30 ENCOUNTER — Encounter (INDEPENDENT_AMBULATORY_CARE_PROVIDER_SITE_OTHER): Payer: Self-pay | Admitting: Diagnostic Radiology

## 2009-03-31 ENCOUNTER — Ambulatory Visit: Payer: Self-pay | Admitting: Internal Medicine

## 2009-03-31 DIAGNOSIS — M25529 Pain in unspecified elbow: Secondary | ICD-10-CM | POA: Insufficient documentation

## 2009-03-31 DIAGNOSIS — Z87891 Personal history of nicotine dependence: Secondary | ICD-10-CM | POA: Insufficient documentation

## 2009-09-15 ENCOUNTER — Encounter: Admission: RE | Admit: 2009-09-15 | Discharge: 2009-09-15 | Payer: Self-pay | Admitting: Obstetrics & Gynecology

## 2009-10-31 ENCOUNTER — Encounter (INDEPENDENT_AMBULATORY_CARE_PROVIDER_SITE_OTHER): Payer: Self-pay | Admitting: *Deleted

## 2009-10-31 ENCOUNTER — Ambulatory Visit: Payer: Self-pay | Admitting: Internal Medicine

## 2009-10-31 DIAGNOSIS — E739 Lactose intolerance, unspecified: Secondary | ICD-10-CM | POA: Insufficient documentation

## 2009-10-31 DIAGNOSIS — R1013 Epigastric pain: Secondary | ICD-10-CM | POA: Insufficient documentation

## 2009-11-01 LAB — CONVERTED CEMR LAB
AST: 13 units/L (ref 0–37)
Albumin: 3.6 g/dL (ref 3.5–5.2)
Alkaline Phosphatase: 62 units/L (ref 39–117)
Calcium: 8.9 mg/dL (ref 8.4–10.5)
Chloride: 110 meq/L (ref 96–112)
Eosinophils Relative: 1.4 % (ref 0.0–5.0)
Glucose, Bld: 94 mg/dL (ref 70–99)
HCT: 36.1 % (ref 36.0–46.0)
Hemoglobin, Urine: NEGATIVE
Hemoglobin: 12.4 g/dL (ref 12.0–15.0)
Ketones, ur: NEGATIVE mg/dL
Leukocytes, UA: NEGATIVE
Lipase: 22 units/L (ref 11.0–59.0)
Lymphocytes Relative: 38.2 % (ref 12.0–46.0)
Lymphs Abs: 2.6 10*3/uL (ref 0.7–4.0)
MCHC: 34.5 g/dL (ref 30.0–36.0)
MCV: 89.5 fL (ref 78.0–100.0)
Monocytes Absolute: 0.5 10*3/uL (ref 0.1–1.0)
Neutro Abs: 3.7 10*3/uL (ref 1.4–7.7)
Potassium: 4.4 meq/L (ref 3.5–5.1)
RDW: 14.3 % (ref 11.5–14.6)
Urobilinogen, UA: 1 (ref 0.0–1.0)
Vitamin B-12: 388 pg/mL (ref 211–911)
pH: 7.5 (ref 5.0–8.0)

## 2009-11-02 ENCOUNTER — Encounter: Admission: RE | Admit: 2009-11-02 | Discharge: 2009-11-02 | Payer: Self-pay | Admitting: Internal Medicine

## 2009-11-02 IMAGING — US US ABDOMEN COMPLETE
1 series · 14 of 25 positions shown · non-contrast
Comparison: None.

CLINICAL DATA: Abdominal pain, particularly epigastric, some
diarrhea

COMPLETE ABDOMINAL ULTRASOUND

[Series 1: us abdomen complete · 0.27mm/px · 14 of 86 slices shown]
[im 1/86]
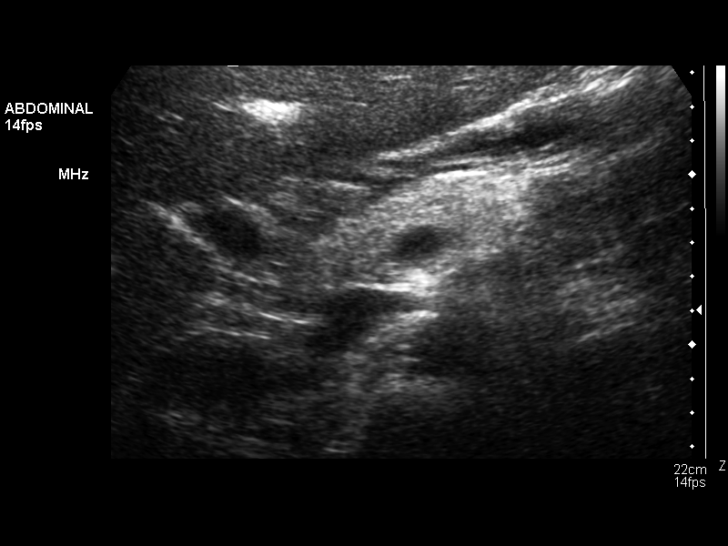
[im 8/86]
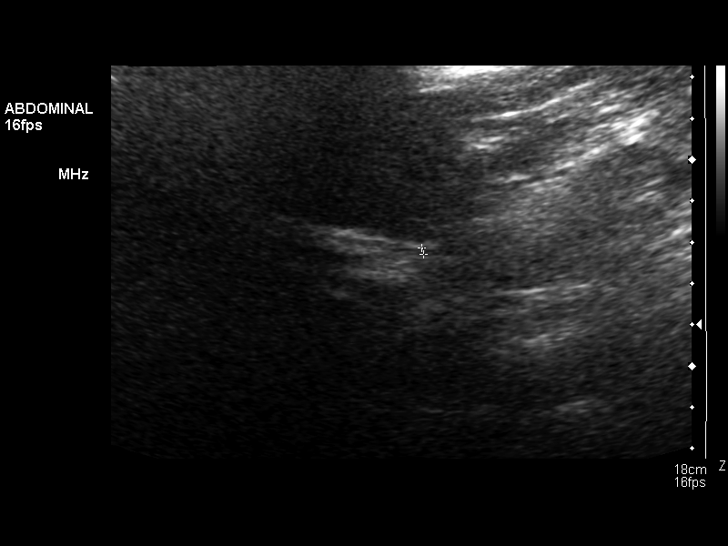
[im 15/86]
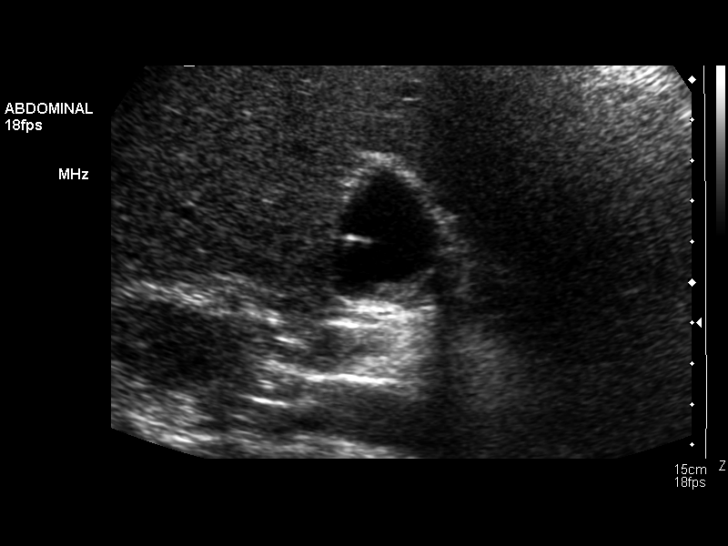
[im 22/86]
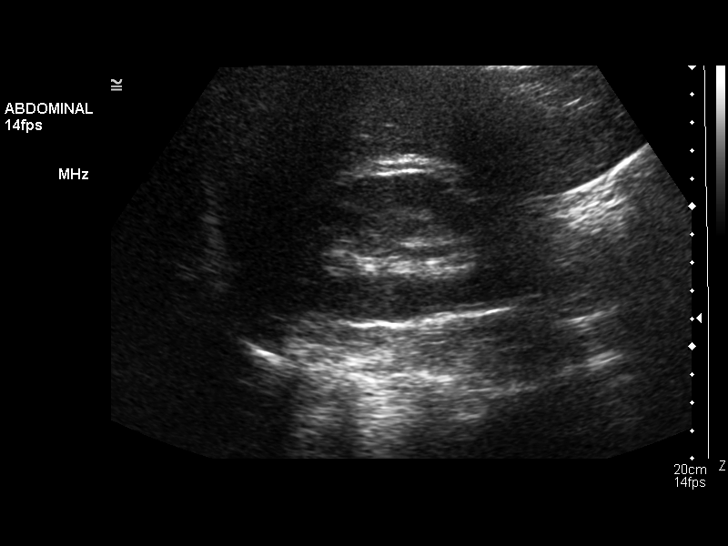
[im 29/86]
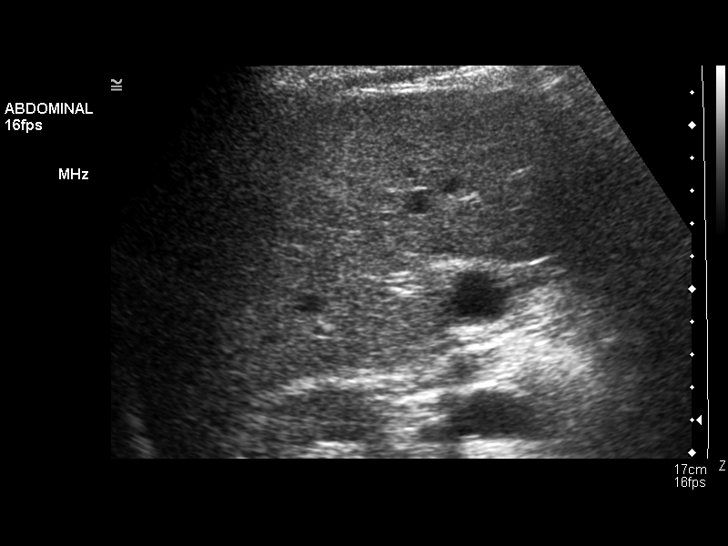
[im 32/86]
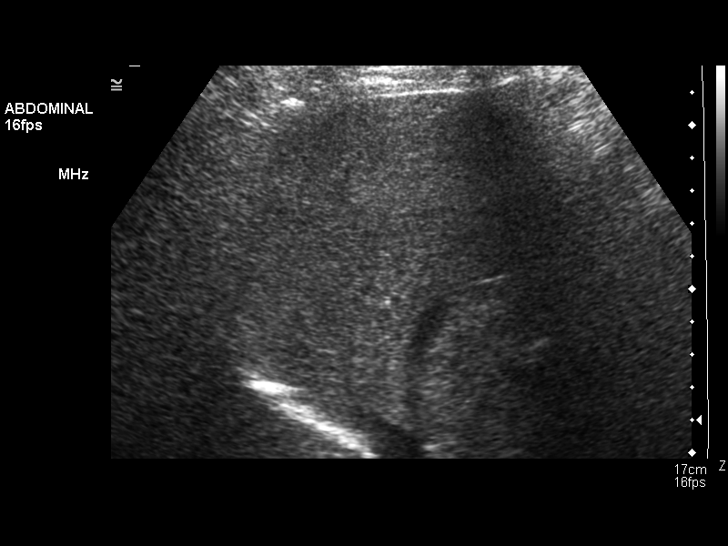
[im 39/86]
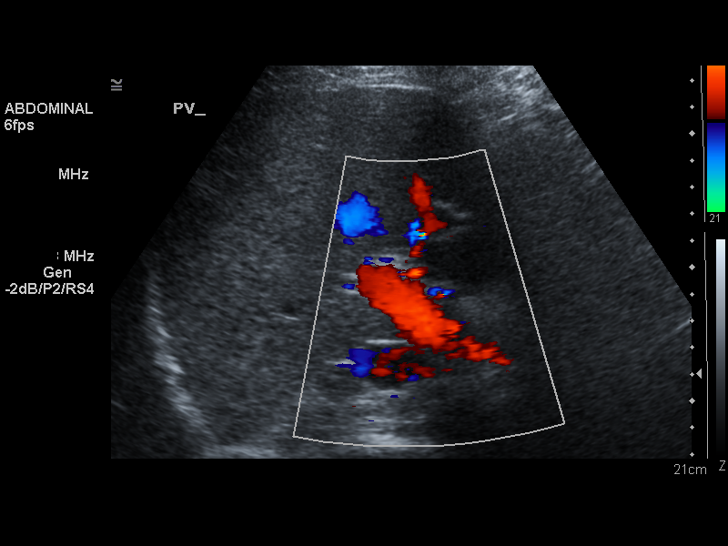
[im 47/86]
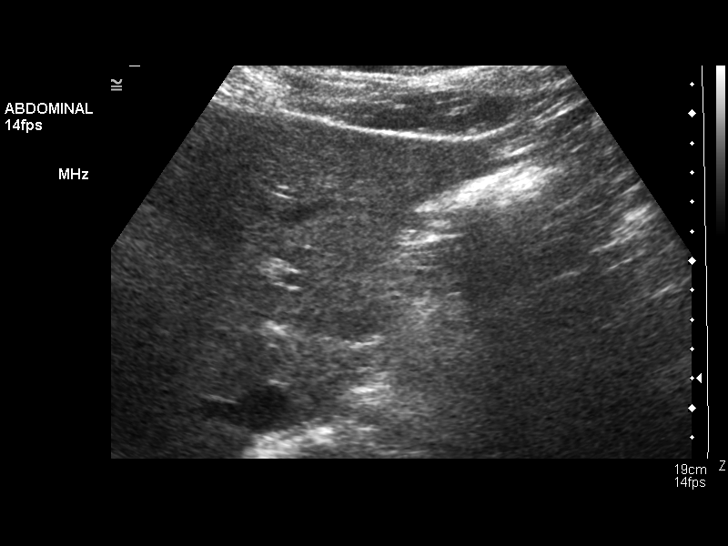
[im 54/86]
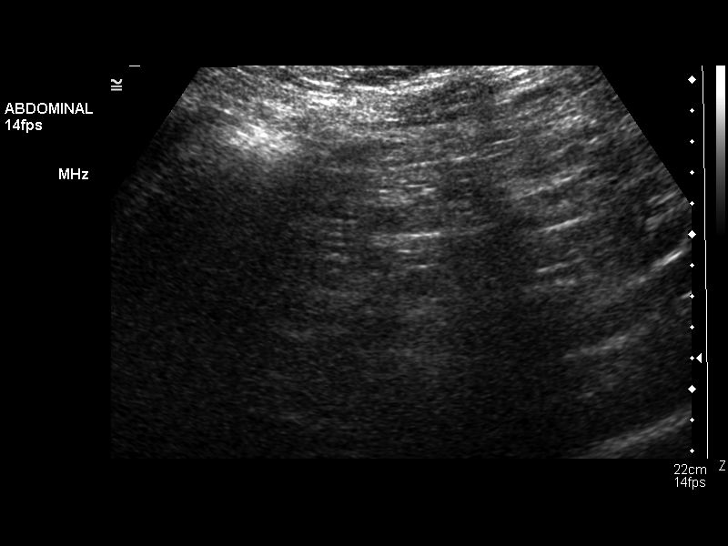
[im 57/86]
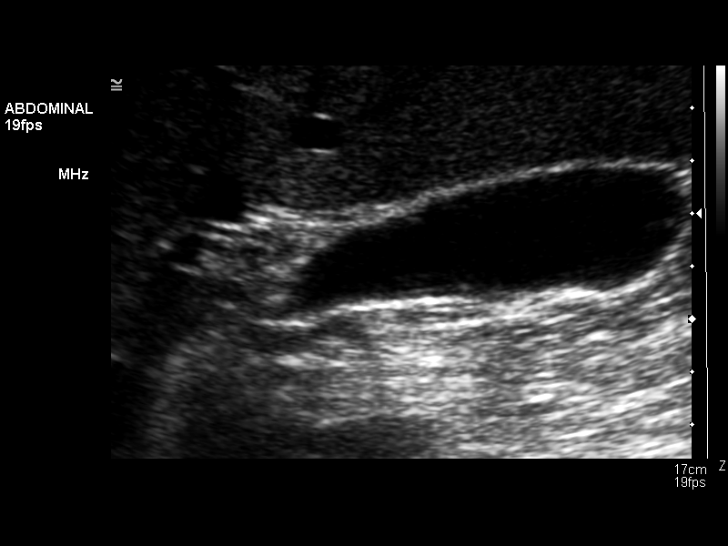
[im 64/86]
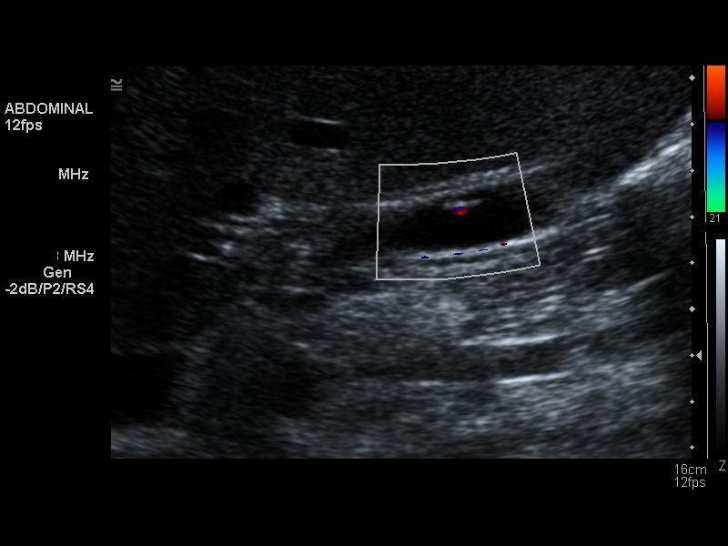
[im 71/86]
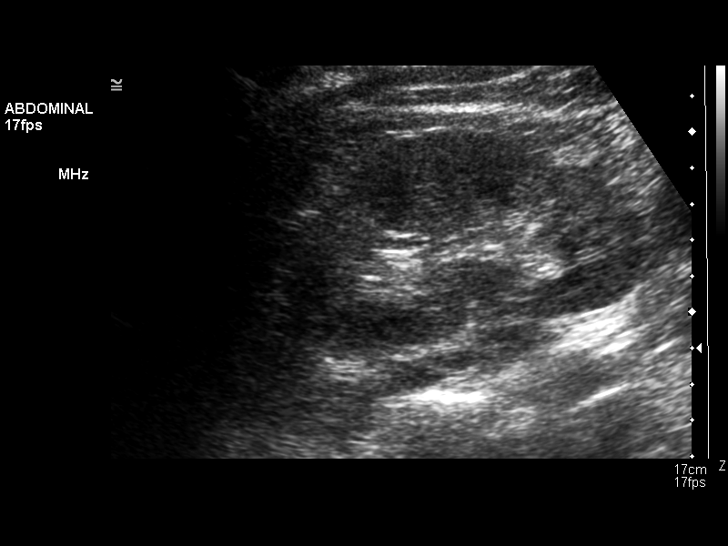
[im 78/86]
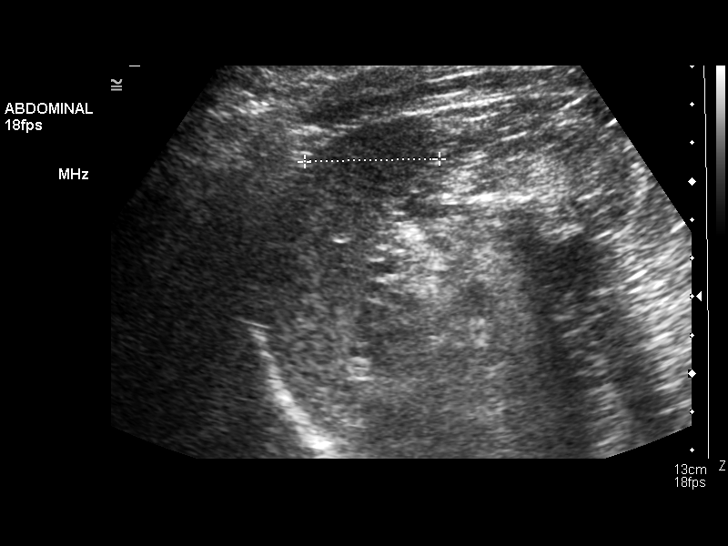
[im 86/86]
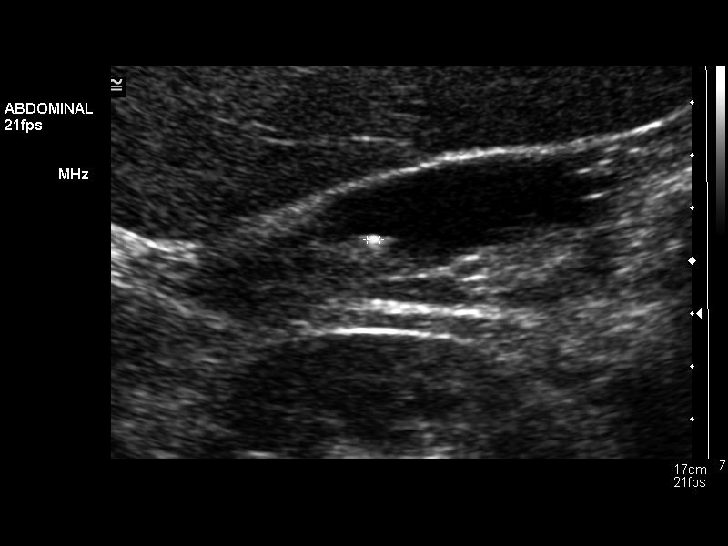

[14 of 25 positions shown; findings below may reference images not displayed]

FINDINGS: Gallbladder:  There is an echogenic nonmobile focus of 2.4 mm in
the gallbladder consistent with a polyp.  No definite gallstones
are seen.

Common bile duct:  The common bile duct is normal measuring 2.9 mm
in diameter.

Liver:  No focal lesion is identified. No ductal dilatation is
seen.  The liver is inhomogeneous consistent with fatty
infiltration.

IVC:  Appears normal.

Pancreas:  The tail of the pancreas is obscured by bowel gas.

Spleen:  The spleen is normal measuring 3.5 cm sagittally.

Right Kidney:  No hydronephrosis is seen.  The right kidney
measures 12.4 cm sagittally.

Left Kidney:  No hydronephrosis.  The left kidney measures 11.1 cm.

Abdominal aorta:  The abdominal aorta is normal in caliber.  The
common iliac arteries are obscured.
IMPRESSION: 1.  Inhomogeneous echogenic liver consistent with fatty
infiltration.  No focal abnormality.
2.  Probable gallbladder polyp.  No definite gallstones.  No pain
over the gallbladder.
3.  The tail of the pancreas is obscured by bowel gas.

## 2009-11-03 ENCOUNTER — Telehealth: Payer: Self-pay | Admitting: Internal Medicine

## 2009-12-14 ENCOUNTER — Ambulatory Visit: Payer: Self-pay | Admitting: Internal Medicine

## 2009-12-14 DIAGNOSIS — R1011 Right upper quadrant pain: Secondary | ICD-10-CM | POA: Insufficient documentation

## 2009-12-14 DIAGNOSIS — R932 Abnormal findings on diagnostic imaging of liver and biliary tract: Secondary | ICD-10-CM | POA: Insufficient documentation

## 2010-04-10 ENCOUNTER — Encounter: Admission: RE | Admit: 2010-04-10 | Discharge: 2010-04-10 | Payer: Self-pay | Admitting: Obstetrics & Gynecology

## 2010-04-25 ENCOUNTER — Ambulatory Visit: Payer: Self-pay | Admitting: Internal Medicine

## 2010-05-02 ENCOUNTER — Ambulatory Visit: Payer: Self-pay | Admitting: Internal Medicine

## 2010-05-02 ENCOUNTER — Encounter: Payer: Self-pay | Admitting: Internal Medicine

## 2010-06-14 ENCOUNTER — Ambulatory Visit
Admission: RE | Admit: 2010-06-14 | Discharge: 2010-06-14 | Payer: Self-pay | Source: Home / Self Care | Attending: Internal Medicine | Admitting: Internal Medicine

## 2010-06-14 ENCOUNTER — Other Ambulatory Visit: Payer: Self-pay | Admitting: Internal Medicine

## 2010-06-14 LAB — HEPATIC FUNCTION PANEL
ALT: 13 U/L (ref 0–35)
AST: 13 U/L (ref 0–37)
Albumin: 3.6 g/dL (ref 3.5–5.2)
Alkaline Phosphatase: 67 U/L (ref 39–117)
Bilirubin, Direct: 0.1 mg/dL (ref 0.0–0.3)
Total Bilirubin: 1.1 mg/dL (ref 0.3–1.2)
Total Protein: 6.8 g/dL (ref 6.0–8.3)

## 2010-06-14 LAB — BASIC METABOLIC PANEL
BUN: 13 mg/dL (ref 6–23)
CO2: 23 mEq/L (ref 19–32)
Calcium: 8.8 mg/dL (ref 8.4–10.5)
Chloride: 105 mEq/L (ref 96–112)
Creatinine, Ser: 0.7 mg/dL (ref 0.4–1.2)
GFR: 122.52 mL/min (ref >60.00)
Glucose, Bld: 85 mg/dL (ref 70–99)
Potassium: 4 mEq/L (ref 3.5–5.1)
Sodium: 136 mEq/L (ref 135–145)

## 2010-06-14 LAB — URINALYSIS
Bilirubin Urine: NEGATIVE
Hemoglobin, Urine: NEGATIVE
Ketones, ur: NEGATIVE
Leukocytes, UA: NEGATIVE
Nitrite: NEGATIVE
Specific Gravity, Urine: >=1.03 (ref 1.000–1.030)
Total Protein, Urine: NEGATIVE
Urine Glucose: NEGATIVE
Urobilinogen, UA: 0.2 (ref 0.0–1.0)
pH: 5 (ref 5.0–8.0)

## 2010-06-14 LAB — CBC WITH DIFFERENTIAL/PLATELET
Basophils Absolute: 0 10*3/uL (ref 0.0–0.1)
Basophils Relative: 0.4 % (ref 0.0–3.0)
Eosinophils Absolute: 0.1 10*3/uL (ref 0.0–0.7)
Eosinophils Relative: 1 % (ref 0.0–5.0)
HCT: 38.5 % (ref 36.0–46.0)
Hemoglobin: 12.9 g/dL (ref 12.0–15.0)
Lymphocytes Relative: 33.2 % (ref 12.0–46.0)
Lymphs Abs: 2 10*3/uL (ref 0.7–4.0)
MCHC: 33.4 g/dL (ref 30.0–36.0)
MCV: 91.4 fl (ref 78.0–100.0)
Monocytes Absolute: 0.4 10*3/uL (ref 0.1–1.0)
Monocytes Relative: 6.2 % (ref 3.0–12.0)
Neutro Abs: 3.6 10*3/uL (ref 1.4–7.7)
Neutrophils Relative %: 59.2 % (ref 43.0–77.0)
Platelets: 203 10*3/uL (ref 150.0–400.0)
RBC: 4.22 Mil/uL (ref 3.87–5.11)
RDW: 14.9 % — ABNORMAL HIGH (ref 11.5–14.6)
WBC: 6.1 10*3/uL (ref 4.5–10.5)

## 2010-06-14 LAB — LDL CHOLESTEROL, DIRECT: Direct LDL: 144 mg/dL

## 2010-06-14 LAB — LIPID PANEL
Cholesterol: 216 mg/dL — ABNORMAL HIGH (ref 0–200)
HDL: 61.7 mg/dL (ref >39.00)
Total CHOL/HDL Ratio: 4
Triglycerides: 47 mg/dL (ref 0.0–149.0)
VLDL: 9.4 mg/dL (ref 0.0–40.0)

## 2010-06-14 LAB — TSH: TSH: 2.26 u[IU]/mL (ref 0.35–5.50)

## 2010-07-10 NOTE — Assessment & Plan Note (Signed)
Summary: ABD PAIN / abnormal ultrasound   History of Present Illness Visit Type: consult  Primary GI MD: Yancey Flemings MD Primary Provider: Jacinta Shoe, MD Requesting Provider: Jacinta Shoe, MD Chief Complaint: Acid reflux, and heartburn  History of Present Illness:   45 year old African American female with morbid obesity, dyslipidemia, and GERD. She presents today regarding abdominal pain and abnormal biliary imaging. The patient has chronic GERD for which she uses Protonix p.r.n. She states she does better with reflux precautions and dietary measures. She denies dysphagia. Next, she had some problems with right-sided abdominal pain and may. Problem lasted most of one day. She was evaluated on May 24. CBC, comprehensive metabolic panel, liver function tests, serum lipase, TSH, and urinalysis were normal. Sedimentation rate was unremarkable. She subsequently underwent an abdominal ultrasound on May 26. This revealed fatty liver and a probable gallbladder polyp. Patient's had no further problems with pain. No other complaints. We did discuss colon cancer screening.   GI Review of Systems    Reports acid reflux and  heartburn.      Denies abdominal pain, belching, bloating, chest pain, dysphagia with liquids, dysphagia with solids, loss of appetite, nausea, vomiting, vomiting blood, weight loss, and  weight gain.        Denies anal fissure, black tarry stools, change in bowel habit, constipation, diarrhea, diverticulosis, fecal incontinence, heme positive stool, hemorrhoids, irritable bowel syndrome, jaundice, light color stool, liver problems, rectal bleeding, and  rectal pain.    Current Medications (verified): 1)  Protonix 40 Mg  Pack (Pantoprazole Sodium) .Marland Kitchen.. 1 Once Daily 2)  Nasacort Aq 55 Mcg/act Aers (Triamcinolone Acetonide(Nasal)) .Marland Kitchen.. 1-2 Spr Once Daily 3)  Vitamin B-12 1000 Mcg  Tabs (Cyanocobalamin) .Marland Kitchen.. 1 Qd 4)  Vitamin D3 1000 Unit  Tabs (Cholecalciferol) .Marland Kitchen.. 1 Qd 5)   Alprazolam 0.5 Mg  Tabs (Alprazolam) .Marland Kitchen.. 1 By Mouth Two Times A Day As Needed Anxiety 6)  Hydrocodone-Acetaminophen 5-325 Mg Tabs (Hydrocodone-Acetaminophen) .Marland Kitchen.. 1 By Mouth Up To 4 Times Per Day As Needed For Pain  Allergies (verified): 1)  ! Penicillin 2)  Codeine  Past History:  Past Medical History: Reviewed history from 10/31/2009 and no changes required. Obesity Depression, treated with Zoloft in past Hyperlipidemia, no treatment required Osteoarthritis of knees, bilaterally Allergic rhinitis GERD Lactose intol.  Past Surgical History: Reviewed history from 01/08/2008 and no changes required. 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  The patient denies allergy/sinus, anemia, anxiety-new, arthritis/joint pain, blood in urine, breast changes/lumps, change in vision, confusion, cough, coughing up blood, depression-new, fainting, fatigue, fever, headaches-new, hearing problems, heart murmur, heart rhythm changes, itching, menstrual pain, muscle pains/cramps, night sweats, nosebleeds, pregnancy symptoms, shortness of breath, skin rash, sleeping problems, sore throat, swelling of feet/legs, swollen lymph glands, thirst - excessive , urination - excessive , urination changes/pain, urine leakage, vision changes, and voice change.    Vital Signs:  Patient profile:   45 year old female Height:      67.5 inches Weight:      335 pounds BMI:     51.88 BSA:     2.53 Pulse rate:   80 / minute Pulse rhythm:   regular BP sitting:  132 / 82  (left arm) Cuff size:   large  Vitals Entered By: Ok Anis CMA (December 14, 2009 3:13 PM)  Physical Exam  General:  Well developed,  markedly obese,well nourished, no acute distress. Head:  Normocephalic and atraumatic. Eyes:  PERRLA, no icterus. Ears:  Normal auditory acuity. Nose:  No deformity, discharge,  or lesions. Mouth:  No deformity or lesions Neck:  thick Lungs:  Clear throughout to auscultation. Heart:  Regular rate and rhythm; no murmurs, rubs,  or bruits. Abdomen:  Soft, obese,nontender and nondistended. No masses, hepatosplenomegaly or hernias noted. Normal bowel sounds. Msk:  no deformities Pulses:  Normal pulses noted. Extremities:  no edema Neurologic:  alert and oriented Skin:  no obvious lesions Psych:  Alert and cooperative. Normal mood and affect.   Impression & Recommendations:  Problem # 1:  ABDOMINAL PAIN-RUQ (ICD-789.01) transient right-sided abdominal pain. Etiology unclear. No recurrence. Recommend observation. If pain returns, consider reevaluation and/or HIDA scan..  Problem # 2:  ABNORMAL EXAM-BILIARY TRACT (ICD-793.3) possible gallbladder polyp on ultrasound. No indication for cholecystectomy. Also noted to have fatty liver. She needs to continue with gradual weight loss and sensible regular exercise.  Problem # 3:  SCREENING COLORECTAL-CANCER (ICD-V76.51) due for routine screening around age 45 as she is African American. We will probably recall in the computer system.  Problem # 4:  GERD (ICD-530.81) GERD.  Plan: #1. Reflux precautions with attention to weight loss #2. Okay to use PPI on demand  Patient Instructions: 1)  Please schedule a follow-up appointment as needed.  2)  The medication list was reviewed and reconciled.  All changed / newly prescribed medications were explained.  A complete medication list was provided to the patient / caregiver. 3)  Copy: Dr. Trinna Post Plotnikov

## 2010-07-10 NOTE — Progress Notes (Signed)
Summary: REFILL  Phone Note Call from Patient Call back at [facility] Phone [phone]   Summary of Call: Patient is requesting results of u/s.  Initial call taken by: Lamar Sprinkles, CMA,  Nov 03, 2009 9:00 AM  Follow-up for Phone Call        Overall [place] is OK: it found - 1. Fatty liver - needs to loose wt 2. Polyp in gallbladder - very small - not likely to bother  Keep GI appt Follow-up by: Tresa Garter MD,  Nov 03, 2009 3:23 PM  Additional Follow-up for Phone Call Additional follow up Details #1::        Left vm for pt Additional Follow-up by: Lamar Sprinkles, CMA,  Nov 03, 2009 3:58 PM

## 2010-07-10 NOTE — Assessment & Plan Note (Signed)
Summary: stomach ache, offered sda/cd   Vital Signs:  Patient profile:   45 year old female Height:      67.5 inches Weight:      331 pounds BMI:     51.26 O2 Sat:      97 % on Room air Temp:     98.1 degrees F oral Pulse rate:   77 / minute BP sitting:   112 / 72  (left arm) Cuff size:   large  Vitals Entered ByZella Ball Ewing (Oct 31, 2009 8:02 AM)  O2 Flow:  Room air CC: Stomach pain for 3 months/RE   CC:  Stomach pain for 3 months/RE.  History of Present Illness: C/o epig pain nagging x 2-3 months worse with eating; may last x 20-30 min several times a day; occasional at night. BMs every other day or so. No diarrhea. Protonix is not helping. C/o nasty taste in mouth. F/u OA, anxiety  Current Medications (verified): 1)  Protonix 40 Mg  Pack (Pantoprazole Sodium) .Marland Kitchen.. 1 Once Daily 2)  Nasacort Aq 55 Mcg/act Aers (Triamcinolone Acetonide(Nasal)) .Marland Kitchen.. 1-2 Spr Once Daily 3)  Vitamin B-12 1000 Mcg  Tabs (Cyanocobalamin) .Marland Kitchen.. 1 Qd 4)  Vitamin D3 1000 Unit  Tabs (Cholecalciferol) .Marland Kitchen.. 1 Qd 5)  Alprazolam 0.5 Mg  Tabs (Alprazolam) .Marland Kitchen.. 1 By Mouth Two Times A Day As Needed Anxiety 6)  Hydrocodone-Acetaminophen 5-325 Mg Tabs (Hydrocodone-Acetaminophen) .Marland Kitchen.. 1 By Mouth Up To 4 Times Per Day As Needed For Pain  Allergies (verified): 1)  ! 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  Family History: Family History Diabetes 1st degree relative GM - gallstones  Review of Systems       The patient complains of dyspnea on exertion, abdominal pain, and severe indigestion/heartburn.  The  patient denies anorexia, weight loss, weight gain, chest pain, syncope, melena, and hematochezia.    Physical Exam  General:  overweight-appearing.   Eyes:  No corneal or conjunctival inflammation noted. EOMI. Perrla.  Nose:  External nasal examination shows no deformity or inflammation. Nasal mucosa are pink and moist without lesions or exudates. Mouth:  Oral mucosa and oropharynx without lesions or exudates.  Teeth in good repair. Neck:  No deformities, masses, or tenderness noted. Lungs:  Normal respiratory effort, chest expands symmetrically. Lungs are clear to auscultation, no crackles or wheezes. Heart:  Normal rate and regular rhythm. S1 and S2 normal without gallop, murmur, click, rub or other extra sounds. Abdomen:  Bowel sounds positive,abdomen soft and non-tender without masses, organomegaly or hernias noted.RUQ and LUQ are sensitive to palp  Msk:  No deformity or scoliosis noted of thoracic or lumbar spine.   Extremities:  No clubbing, cyanosis, edema, or deformity noted with normal full range of motion of all joints.   Neurologic:  No cranial nerve deficits noted. Station and gait are normal. Plantar reflexes are down-going bilaterally. DTRs are symmetrical throughout. Sensory, motor and coordinative functions appear intact. Skin:  Intact without suspicious lesions or rashes Psych:  Cognition and judgment appear intact. Alert and cooperative with normal attention span and concentration. No apparent delusions, illusions, hallucinations   Impression & Recommendations:  Problem # 1:  ABDOMINAL PAIN,  EPIGASTRIC (ICD-789.06) - r/o PUD, GS, motility disorder Assessment New  Orders: Gastroenterology Referral (GI) Radiology Referral (Radiology) TLB-B12, Serum-Total ONLY ([address]-U04) TLB-BMP (Basic Metabolic Panel-BMET) (80048-METABOL) TLB-CBC Platelet - w/Differential (85025-CBCD) TLB-Hepatic/Liver Function Pnl (80076-HEPATIC) TLB-Lipase (83690-LIPASE) TLB-TSH (Thyroid Stimulating  Hormone) (84443-TSH) TLB-Sedimentation Rate (ESR) (85652-ESR) TLB-H. Pylori Abs(Helicobacter Pylori) (86677-HELICO)  Problem # 2:  GERD (ICD-530.81)  Her updated medication list for this problem includes:    Protonix 40 Mg Pack (Pantoprazole sodium) .Marland Kitchen... 1 once daily - start to take daily  Problem # 3:  LACTOSE INTOLERANCE (ICD-271.3) Assessment: New On diet  Problem # 4:  OSTEOARTHRITIS (ICD-715.90)  Her updated medication list for this problem includes:    Hydrocodone-acetaminophen 5-325 Mg Tabs (Hydrocodone-acetaminophen) .Marland Kitchen... 1 by mouth up to 4 times per day as needed for pain  Problem # 5:  ANXIETY (ICD-300.00) Assessment: Unchanged  Her updated medication list for this problem includes:    Alprazolam 0.5 Mg Tabs (Alprazolam) .Marland Kitchen... 1 by mouth two times a day as needed anxiety  Complete Medication List: 1)  Protonix 40 Mg Pack (Pantoprazole sodium) .Marland Kitchen.. 1 once daily 2)  Nasacort Aq 55 Mcg/act Aers (Triamcinolone acetonide(nasal)) .Marland Kitchen.. 1-2 spr once daily 3)  Vitamin B-12 1000 Mcg Tabs (Cyanocobalamin) .Marland Kitchen.. 1 qd 4)  Vitamin D3 1000 Unit Tabs (Cholecalciferol) .Marland Kitchen.. 1 qd 5)  Alprazolam 0.5 Mg Tabs (Alprazolam) .Marland Kitchen.. 1 by mouth two times a day as needed anxiety 6)  Hydrocodone-acetaminophen 5-325 Mg Tabs (Hydrocodone-acetaminophen) .Marland Kitchen.. 1 by mouth up to 4 times per day as needed for pain  Other Orders: TLB-Udip ONLY (81003-UDIP)  Patient Instructions: 1)  Please schedule a follow-up appointment in 2 months. Prescriptions: HYDROCODONE-ACETAMINOPHEN 5-325 MG TABS (HYDROCODONE-ACETAMINOPHEN) 1 by mouth up to 4 times per day as needed for pain  #120 x 1   Entered and Authorized by:   Tresa Garter MD   Signed by:   Tresa Garter MD on 10/31/2009   Method used:   Print then Give to Patient   RxID:   [id number] ALPRAZOLAM 0.5 MG  TABS (ALPRAZOLAM) 1 by mouth two times a day as needed anxiety  #60 x 6   Entered and Authorized by:   Tresa Garter MD    Signed by:   Tresa Garter MD on 10/31/2009   Method used:   Print then Give to Patient   RxID:   [id number] PROTONIX 40 MG  PACK (PANTOPRAZOLE SODIUM) 1 once daily  #30 x 12   Entered and Authorized by:   Tresa Garter MD   Signed by:   Tresa Garter MD on 10/31/2009   Method used:   Print then Give to Patient   RxID:   [id number]

## 2010-07-10 NOTE — Letter (Signed)
Summary: New Patient letter  [facility]  [address] [place], [place] [address]   Phone: [phone]  Fax: [phone]       10/31/2009 MRN: [id number]  Charlotte Meyer [address] [place], [place]  [address]  Dear Ms. MOORE,  Welcome to the Gastroenterology Division at [facility].    You are scheduled to see Dr. Marina Goodell on 12-13-09 at 9:15a.m. on the 3rd floor at [facility], [address].  We ask that you try to arrive at our office 15 minutes prior to your appointment time to allow for check-in.  We would like you to complete the enclosed self-administered evaluation form prior to your visit and bring it with you on the day of your appointment.  We will review it with you.  Also, please bring a complete list of all your medications or, if you prefer, bring the medication bottles and we will list them.  Please bring your insurance card so that we may make a copy of it.  If your insurance requires a referral to see a specialist, please bring your referral form from your primary care physician.  Co-payments are due at the time of your visit and may be paid by cash, check or credit card.     Your office visit will consist of a consult with your physician (includes a physical exam), any laboratory testing he/she may order, scheduling of any necessary diagnostic testing (e.g. x-ray, ultrasound, CT-scan), and scheduling of a procedure (e.g. Endoscopy, Colonoscopy) if required.  Please allow enough time on your schedule to allow for any/all of these possibilities.    If you cannot keep your appointment, please call [phone] to cancel or reschedule prior to your appointment date.  This allows [place] the opportunity to schedule an appointment for another patient in need of care.  If you do not cancel or reschedule by 5 p.m. the business day prior to your appointment date, you will be charged a $50.00 late cancellation/no-show fee.    Thank you for choosing  [facility] for your medical needs.  We appreciate the opportunity to care for you.  Please visit [place] at our website  to learn more about our practice.                     Sincerely,                                                             The Gastroenterology Division

## 2010-07-10 NOTE — Assessment & Plan Note (Signed)
Summary: PHYSICAL #   STC   Vital Signs:  Patient profile:   45 year old female Height:      67.5 inches Weight:      318 pounds BMI:     49.25 Temp:     99.2 degrees F oral Pulse rate:   76 / minute Pulse rhythm:   regular Resp:     16 per minute BP sitting:   120 / 78  (left arm) Cuff size:   large  Vitals Entered By: Lanier Prude, Beverly Gust) (May 02, 2010 2:23 PM) CC: CPX Is Patient Diabetic? No   Primary Care Provider:  Jacinta Shoe, MD  CC:  CPX.  History of Present Illness: The patient presents for a preventive health examination    Current Medications (verified): 1)  Protonix 40 Mg  Pack (Pantoprazole Sodium) .Marland Kitchen.. 1 Once Daily 2)  Nasacort Aq 55 Mcg/act Aers (Triamcinolone Acetonide(Nasal)) .Marland Kitchen.. 1-2 Spr Once Daily 3)  Vitamin B-12 1000 Mcg  Tabs (Cyanocobalamin) .Marland Kitchen.. 1 Qd 4)  Vitamin D3 1000 Unit  Tabs (Cholecalciferol) .Marland Kitchen.. 1 Qd 5)  Alprazolam 0.5 Mg  Tabs (Alprazolam) .Marland Kitchen.. 1 By Mouth Two Times A Day As Needed Anxiety 6)  Hydrocodone-Acetaminophen 5-325 Mg Tabs (Hydrocodone-Acetaminophen) .Marland Kitchen.. 1 By Mouth Up To 4 Times Per Day As Needed For Pain  Allergies (verified): 1)  ! Penicillin 2)  Codeine  Past History:  Past Medical History: Last updated: 10/31/2009 Obesity Depression, treated with Zoloft in past Hyperlipidemia, no treatment required Osteoarthritis of knees, bilaterally Allergic rhinitis GERD Lactose intol.  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 Single Alcohol use-no Drug use-no Former Smoker Daily Caffeine Use  Social History: Occupation: Scientist, physiological At school med billing 1 child born in 2004 Single Alcohol use-no Drug  use-no Former Smoker Daily Caffeine Use  Review of Systems  The patient denies anorexia, fever, weight gain, vision loss, decreased hearing, hoarseness, chest pain, syncope, dyspnea on exertion, peripheral edema, prolonged cough, headaches, hemoptysis, abdominal pain, melena, hematochezia, severe indigestion/heartburn, hematuria, incontinence, genital sores, muscle weakness, suspicious skin lesions, transient blindness, difficulty walking, depression, unusual weight change, abnormal bleeding, enlarged lymph nodes, angioedema, and breast masses.         Lost 17 lbs on a gluten free diet and HCG drops per her chiropractor  Physical Exam  General:  overweight-appearing.   Head:  Normocephalic and atraumatic without obvious abnormalities. No apparent alopecia or balding. Facial hair in exess Eyes:  No corneal or conjunctival inflammation noted. EOMI. Perrla.  Ears:  External ear exam shows no significant lesions or deformities.  Otoscopic examination reveals clear canals, tympanic membranes are intact bilaterally without bulging, retraction, inflammation or discharge. Hearing is grossly normal bilaterally. Nose:  External nasal examination shows no deformity or inflammation. Nasal mucosa are pink and moist without lesions or exudates. Mouth:  Oral mucosa and oropharynx without lesions or exudates.  Teeth in good repair. Neck:  No deformities, masses, or tenderness noted. Lungs:  Normal respiratory effort, chest expands symmetrically. Lungs are clear to auscultation, no crackles or wheezes. Heart:  Normal rate and regular rhythm. S1 and S2 normal without gallop, murmur, click, rub or other extra sounds. Abdomen:  Bowel sounds positive,abdomen soft and non-tender without masses, organomegaly or hernias noted.RUQ and  LUQ are sensitive to palp  Msk:  No deformity or scoliosis noted of thoracic or lumbar spine.  Lumbar-sacral spine is tender to palpation over paraspinal muscles and painfull with the ROM   Extremities:  No clubbing, cyanosis, edema, or deformity noted with normal full range of motion of all joints.   Neurologic:  No cranial nerve deficits noted. Station and gait are normal. Plantar reflexes are down-going bilaterally. DTRs are symmetrical throughout. Sensory, motor and coordinative functions appear intact. Skin:  Intact without suspicious lesions or rashes Psych:  Cognition and judgment appear intact. Alert and cooperative with normal attention span and concentration. No apparent delusions, illusions, hallucinations   Impression & Recommendations:  Problem # 1:  Preventive Health Care (ICD-V70.0) Assessment New Health and age related issues were discussed. Available screening tests and vaccinations were discussed as well. Healthy life style including good diet and exercise was discussed.  The labs were ordered GYN q 1 year  Problem # 2:  OSTEOARTHRITIS (ICD-715.90) Assessment: Unchanged  The following medications were removed from the medication list:    Hydrocodone-acetaminophen 5-325 Mg Tabs (Hydrocodone-acetaminophen) .Marland Kitchen... 1 by mouth up to 4 times per day as needed for pain RX DESTROYED Her updated medication list for this problem includes:    Ibuprofen 600 Mg Tabs (Ibuprofen) .Marland Kitchen... 1 by mouth bid  pc x 1 wk then as needed for  pain    Tramadol Hcl 50 Mg Tabs (Tramadol hcl) .Marland Kitchen... 1-2 tabs by mouth two times a day as needed pain  Problem # 3:  ANXIETY (ICD-300.00) Assessment: Unchanged  Her updated medication list for this problem includes:    Alprazolam 0.5 Mg Tabs (Alprazolam) .Marland Kitchen... 1 by mouth two times a day as needed anxiety  Problem # 4:  DEPRESSION (ICD-311) Assessment: Improved  Her updated medication list for this problem includes:    Alprazolam 0.5 Mg Tabs (Alprazolam) .Marland Kitchen... 1 by mouth two times a day as needed anxiety  Complete Medication List: 1)  Protonix 40 Mg Pack (Pantoprazole sodium) .Marland Kitchen.. 1 once daily 2)  Nasacort Aq 55 Mcg/act Aers  (Triamcinolone acetonide(nasal)) .Marland Kitchen.. 1-2 spr once daily 3)  Vitamin B-12 1000 Mcg Tabs (Cyanocobalamin) .Marland Kitchen.. 1 qd 4)  Vitamin D3 1000 Unit Tabs (Cholecalciferol) .Marland Kitchen.. 1 qd 5)  Alprazolam 0.5 Mg Tabs (Alprazolam) .Marland Kitchen.. 1 by mouth two times a day as needed anxiety 6)  Ibuprofen 600 Mg Tabs (Ibuprofen) .Marland Kitchen.. 1 by mouth bid  pc x 1 wk then as needed for  pain 7)  Tramadol Hcl 50 Mg Tabs (Tramadol hcl) .Marland Kitchen.. 1-2 tabs by mouth two times a day as needed pain  Other Orders: EKG w/ Interpretation (93000) Admin 1st Vaccine (56433) Flu Vaccine 26yrs + (29518)  Patient Instructions: 1)  Go on Youtube (www.youtube.com) and look up "piriformis stretch","IT band stretch" and "gluteus stretch". See the anatomy and learn the symptoms.  Do the stretches - it may help!  2)  Please schedule a follow-up appointment in 1 year well w/labs. 3)  Labs next week: 4)  BMP prior to visit, ICD-9:  v70.0 5)  Hepatic Panel prior to visit, ICD-9: 6)  Lipid Panel prior to visit, ICD-9: 7)  TSH prior to visit, ICD-9: 8)  CBC w/ Diff prior to visit, ICD-9: 9)  Urine-dip prior to visit, ICD-9: 10)  HbgA1C prior to visit, ICD-9: 11)  Vit B12 782.0  790.29 Prescriptions: TRAMADOL HCL 50 MG TABS (TRAMADOL HCL) 1-2 tabs by mouth two times a day as needed pain  #120  x 1   Entered and Authorized by:   Tresa Garter MD   Signed by:   Tresa Garter MD on 05/02/2010   Method used:   Print then Give to Patient   RxID:   [id number] HYDROCODONE-ACETAMINOPHEN 5-325 MG TABS (HYDROCODONE-ACETAMINOPHEN) 1 by mouth up to 4 times per day as needed for pain  #120 x 0   Entered and Authorized by:   Tresa Garter MD   Signed by:   Tresa Garter MD on 05/02/2010   Method used:   Print then Give to Patient   RxID:   [id number] ALPRAZOLAM 0.5 MG  TABS (ALPRAZOLAM) 1 by mouth two times a day as needed anxiety  #60 x 6   Entered and Authorized by:   Tresa Garter MD   Signed by:   Tresa Garter MD  on 05/02/2010   Method used:   Print then Give to Patient   RxID:   [id number] NASACORT AQ 55 MCG/ACT AERS (TRIAMCINOLONE ACETONIDE(NASAL)) 1-2 spr once daily  #1 x 12   Entered and Authorized by:   Tresa Garter MD   Signed by:   Tresa Garter MD on 05/02/2010   Method used:   Print then Give to Patient   RxID:   [id number] PROTONIX 40 MG  PACK (PANTOPRAZOLE SODIUM) 1 once daily  #30 x 12   Entered and Authorized by:   Tresa Garter MD   Signed by:   Tresa Garter MD on 05/02/2010   Method used:   Print then Give to Patient   RxID:   [id number] IBUPROFEN 600 MG TABS (IBUPROFEN) 1 by mouth bid  pc x 1 wk then as needed for  pain  #60 x 3   Entered and Authorized by:   Tresa Garter MD   Signed by:   Tresa Garter MD on 05/02/2010   Method used:   Print then Give to Patient   RxID:   [id number]    Orders Added: 1)  EKG w/ Interpretation [93000] 2)  Admin 1st Vaccine [90471] 3)  Flu Vaccine 30yrs + [09323] 4)  Est. Patient 40-64 years [55732]    Influenza Vaccine (to be given today)        Flu Vaccine Consent Questions     Do you have a history of severe allergic reactions to this vaccine? no    Any prior history of allergic reactions to egg and/or gelatin? no    Do you have a sensitivity to the preservative Thimersol? no    Do you have a past history of Guillan-Barre Syndrome? no    Do you currently have an acute febrile illness? no    Have you ever had a severe reaction to latex? no    Vaccine information given and explained to patient? yes    Are you currently pregnant? no    Lot Number:[id number]   Exp Date:12/08/2010   Site Given  Left Deltoid IM Lanier Prude, [facility])  May 02, 2010 3:08 PM

## 2010-10-26 NOTE — Op Note (Signed)
   NAME:  Charlotte Meyer, Charlotte Meyer                         ACCOUNT NO.:  [id number]   MEDICAL RECORD NO.:  [id number]                   PATIENT TYPE:  AMB   LOCATION:  SDC                                  [facility]   PHYSICIAN:  Phil D. Okey Dupre, M.D.                  DATE OF BIRTH:  July 14, 1965   DATE OF PROCEDURE:  02/28/2003  DATE OF DISCHARGE:                                 OPERATIVE REPORT   PREOPERATIVE DIAGNOSIS:  Unable to visualize cervix in clinic.   POSTOPERATIVE DIAGNOSIS:  Unable to visualize cervix in clinic.   PROCEDURE:  Insertion of ParaGard intrauterine contraceptive device.   The procedure went as follows:  The patient in a dorsal standard lithotomy  position, the perineum and vagina were prepped with Betadine.  Because of  the patient's obesity, the uterus could not be well-outlined; however, with  the large Deavers we were able to visualize the cervix, which was grasped  with a single-tooth tenaculum and was parous and closed.  The uterine cavity  was sounded to a depth of 8 cm.  The ParaGard IUD was prepared according to  the insert instructions, inserted with the inserter up to the fundus, and  then using the placement stick, the inserter was retracted thus leaving the  IUD in place.  The string was cut short, leaving about 3 cm in length.  The  patient transferred to the recovery room in satisfactory condition.                                                Phil D. Okey Dupre, M.D.    PDR/MEDQ  D:  02/28/2003  T:  02/28/2003  Job:  [id number]

## 2010-10-26 NOTE — Op Note (Signed)
[facility]  Patient:    Charlotte Meyer, Charlotte Meyer                      MRN: [id number] Proc. Date: 01/02/01 Adm. Date:  [id number] Attending:  Lars Pinks                           Operative Report  PREOPERATIVE DIAGNOSIS:       Cervical dysplasia.  POSTOPERATIVE DIAGNOSIS:      Cervical dysplasia.  OPERATION:                    LEEP cervical cone biopsy.  SURGEON:                      Richard D. Arlyce Dice, M.D.  ANESTHESIA:                   IV sedation with paracervical block.  ESTIMATED BLOOD LOSS:         50 cc.  FINDINGS:                     Small area of non-Lugol staining epithelium near the external os.  The rest of the cervix appeared normal.  COMPLICATIONS:                None.  INDICATIONS:                  This is a 45 year old female with persistent low grade squamous intraepithelial neoplasia.  She was followed with repeated Pap smears in the office, all of which showed persistent dysplasia.  A colposcopic biopsy revealed low grade dysplasia near the external os.  An attempt was made to perform cryosurgery, and this could not be done because of the patients obesity and the inability to relax, which put her cervix in a position that could not be exposed well enough for application of the cryoprobe.  Followup Pap smears were obtained and these still showed persistent dysplasia.  The option of continued close observation versus treatment were discussed with the patient who elected to be treated.  DESCRIPTION OF PROCEDURE:     The patient was taken to the operating room and placed in the dorsolithotomy position.  IV sedation was administered.  A paracervical block was placed.  The cervix was painted with Lugol solution ___________ area near the internal os was noted.  A small loop biopsy was then performed using blunt current.  The base of the biopsy site as well as the surrounding tissues were cauterized.  This was to create  hemostasis and to eliminate any marginal dysplasia that may have been present.  The procedure was then terminated.  The patient left the operating room in good condition.  For hemostasis and for further ________ of any dysplastic cells that may have been present on the exocervix.  The procedure at this point was terminated. The patient left the operating room in good condition. DD:  01/02/01 TD:  01/03/01 Job: [id number]

## 2010-10-26 NOTE — Group Therapy Note (Signed)
NAME:  Charlotte Meyer, Charlotte Meyer NO.:  [id number]   MEDICAL RECORD NO.:  [id number]          PATIENT TYPE:  WOC   LOCATION:  [facility]                   FACILITY[facility]  [facility]   PHYSICIAN:  Ellis Parents, MD    DATE OF BIRTH:  02/08/1966   DATE OF SERVICE:  03/06/2004                                    CLINIC NOTE   REASON FOR VISIT:  This 45 year old female comes in for routine Pap smear.  The patient has no problems.  An IUD is in place which was inserted on  October 7 by Dr. Okey Dupre.  The patient is having regular menstrual periods.   PELVIC EXAMINATION:  The vagina is deep and exposure of the cervix is  difficult, but it is finally visualized.  IUD thread is in place protruding  out of the cervix approximately 1 cm.  Cytology is taken.  Bimanual exam is  deferred because of obesity.   The patient is to return in 1 year for routine Pap smear.      SA/MEDQ  D:  03/06/2004  T:  03/06/2004  Job:  [id number]

## 2010-10-26 NOTE — Discharge Summary (Signed)
   NAME:  VERDELL, KINCANNON                         ACCOUNT NO.:  [id number]   MEDICAL RECORD NO.:  [id number]                   PATIENT TYPE:  INP   LOCATION:  9110                                 [facility]ILITY:  [facility]   PHYSICIAN:  Phil D. Okey Dupre, M.D.                  DATE OF BIRTH:  1965-06-13   DATE OF ADMISSION:  01/09/2003  DATE OF DISCHARGE:  01/12/2003                                 DISCHARGE SUMMARY   BRIEF HOSPITAL COURSE:  Juwana Thoreson is a 45 year old African-American  female who was admitted on January 09, 2003 for active labor.  She progressed  well and on January 10, 2003 at 3:29 a.m. she delivered a viable female infant  by normal spontaneous vaginal delivery with Apgars 8 at one minute and 9 at  five minutes.  There was meconium-stained amniotic fluid on AROM so the  infant was DeLee suctioned at delivery.  Intact placenta with three vessel  cord was delivered spontaneously.  Minor vaginal abrasions only not  requiring repair, less than 500 mL estimated blood loss.  Of note, the  patient was given IV gentamycin briefly in the intrapartum period for low-  grade maternal fevers and fetal tachycardia.  No maternal or infant  complications postpartum.  She did sign papers for a tubal ligation but  decided to instead have an IUD which will be placed at her six-week follow-  up at the GYN clinic.  She is breastfeeding her infant and having no  problems with that at the time of discharge, and she was seen by a Hotel manager.  She is now a G1 P1.   DISCHARGE LABORATORY DATA:  Hemoglobin on admission 12.4; on the day prior  to discharge 10.9.  Rubella nonimmune in the prenatal period so MMR will be  given prior to discharge.   DISCHARGE MEDICATIONS:  1. Prenatal vitamins while breastfeeding.  2. Ibuprofen 800 mg p.o. t.i.d. p.r.n.   DISCHARGE INSTRUCTIONS:  Per routine.   FOLLOW-UP:  She is to follow up in six weeks at the GYN clinic for her  postpartum checkup and IUD  placement.     Noelle C. Merilynn Finland, M.D.                 Phil D. Okey Dupre, M.D.    NCR/MEDQ  D:  01/12/2003  T:  01/12/2003  Job:  [id number]

## 2010-10-26 NOTE — Group Therapy Note (Signed)
   NAME:  Charlotte Meyer, POKORNY NO.:  [id number]   MEDICAL RECORD NO.:  [id number]                   PATIENT TYPE:  OUT   LOCATION:  [facility]                           [facility]  [facility]   PHYSICIAN:  Argentina Donovan, MD                     DATE OF BIRTH:  Dec 06, 1965   DATE OF SERVICE:  02/22/2003                                    CLINIC NOTE   CHIEF COMPLAINT:  The patient is a 45 year old para 1-0-0-1 female six weeks  postpartum from a vaginal delivery who desires a copper IUD.  The patient  had previously signed sterilization papers but decided not to have the  sterilization done after the vaginal delivery.  The patient's medical  history is significant for a small bout of depression treated with Zoloft  and GYN history significant for a LEEP performed for CIN 1 with a subsequent  negative Pap smear December 2003.  No history of ectopic pregnancy, PID,  gonorrhea or chlamydia infection.  The patient is in a monogamous  relationship.   ALLERGIES:  PENICILLIN.   MEDICATIONS:  The patient takes no medications.   Informed consent was obtained for insertion of the IUD.  The patient was  placed in dorsal lithotomy position and a bimanual was performed and the  cervix was noted to be anterior.  Attempt to visualize the cervix well was  unsuccessful.  I tried and Dr. Okey Dupre tried as well using the largest bivalve  speculum and lateral wall retractors available to the clinic.  We were  unable to properly visualize the cervix in order to place a single tooth  tenaculum.  At this time the procedure was abandoned and a lengthy  discussion ensued.  The patient decided to try to come back with her menses  and have a cerclage tray available for the procedure to help visualize the  cervix available.  The patient understands reasoning behind this.  The  patient will use condoms and we will repeat pregnancy test when she arrives.  The patient should return to clinic in one  month or with bleeding.     Elsie Lincoln, MD                         Argentina Donovan, MD    KL/MEDQ  D:  02/22/2003  T:  02/22/2003  Job:  [id number]

## 2011-01-23 ENCOUNTER — Other Ambulatory Visit: Payer: Self-pay

## 2011-01-25 ENCOUNTER — Ambulatory Visit: Payer: Self-pay

## 2011-01-25 LAB — CBC WITH DIFFERENTIAL/PLATELET
Basophils Relative: 0.4 % (ref 0.0–3.0)
Lymphocytes Relative: 33.3 % (ref 12.0–46.0)
MCHC: 32.7 g/dL (ref 30.0–36.0)
MCV: 91.8 fl (ref 78.0–100.0)
Monocytes Absolute: 0.5 10*3/uL (ref 0.1–1.0)
Neutro Abs: 4.2 10*3/uL (ref 1.4–7.7)
Neutrophils Relative %: 57.9 % (ref 43.0–77.0)
Platelets: 190 10*3/uL (ref 150.0–400.0)
RDW: 15.3 % — ABNORMAL HIGH (ref 11.5–14.6)
WBC: 7.2 10*3/uL (ref 4.5–10.5)

## 2011-01-25 LAB — BASIC METABOLIC PANEL
CO2: 25 mEq/L (ref 19–32)
Calcium: 8.6 mg/dL (ref 8.4–10.5)

## 2011-01-25 LAB — HEPATIC FUNCTION PANEL
AST: 12 U/L (ref 0–37)
Alkaline Phosphatase: 62 U/L (ref 39–117)
Total Bilirubin: 0.9 mg/dL (ref 0.3–1.2)

## 2011-01-25 LAB — URINALYSIS
Bilirubin Urine: NEGATIVE
Hgb urine dipstick: NEGATIVE
Nitrite: NEGATIVE
Specific Gravity, Urine: 1.02 (ref 1.000–1.030)
Total Protein, Urine: NEGATIVE
Urine Glucose: NEGATIVE
Urobilinogen, UA: 0.2 (ref 0.0–1.0)
pH: 7 (ref 5.0–8.0)

## 2011-01-25 LAB — TSH: TSH: 2.07 u[IU]/mL (ref 0.35–5.50)

## 2011-01-25 LAB — LIPID PANEL: Cholesterol: 190 mg/dL (ref 0–200)

## 2011-01-30 ENCOUNTER — Ambulatory Visit (INDEPENDENT_AMBULATORY_CARE_PROVIDER_SITE_OTHER): Payer: PRIVATE HEALTH INSURANCE | Admitting: Internal Medicine

## 2011-01-30 ENCOUNTER — Encounter: Payer: Self-pay | Admitting: Internal Medicine

## 2011-01-30 VITALS — BP 120/80 | HR 80 | Temp 98.3°F | Resp 16 | Ht 66.0 in | Wt 335.0 lb

## 2011-01-30 DIAGNOSIS — F3289 Other specified depressive episodes: Secondary | ICD-10-CM

## 2011-01-30 DIAGNOSIS — E785 Hyperlipidemia, unspecified: Secondary | ICD-10-CM

## 2011-01-30 DIAGNOSIS — Z0000 Encounter for general adult medical examination without abnormal findings: Secondary | ICD-10-CM | POA: Insufficient documentation

## 2011-01-30 DIAGNOSIS — D649 Anemia, unspecified: Secondary | ICD-10-CM

## 2011-01-30 DIAGNOSIS — F329 Major depressive disorder, single episode, unspecified: Secondary | ICD-10-CM

## 2011-01-30 DIAGNOSIS — F411 Generalized anxiety disorder: Secondary | ICD-10-CM

## 2011-01-30 DIAGNOSIS — E669 Obesity, unspecified: Secondary | ICD-10-CM

## 2011-01-30 MED ORDER — FLUCONAZOLE 150 MG PO TABS: 150.0000 mg | ORAL_TABLET | Freq: Once | ORAL | Status: AC

## 2011-01-30 MED ORDER — MELOXICAM 15 MG PO TABS: 15.0000 mg | ORAL_TABLET | Freq: Every day | ORAL | Status: DC | PRN

## 2011-01-30 MED ORDER — PANTOPRAZOLE SODIUM 40 MG PO TBEC: 40.0000 mg | DELAYED_RELEASE_TABLET | Freq: Every day | ORAL | Status: DC

## 2011-01-30 MED ORDER — TRAMADOL HCL 50 MG PO TABS: 50.0000 mg | ORAL_TABLET | Freq: Two times a day (BID) | ORAL | Status: DC | PRN

## 2011-01-30 MED ORDER — ALPRAZOLAM 0.5 MG PO TABS: 0.5000 mg | ORAL_TABLET | Freq: Three times a day (TID) | ORAL | Status: DC | PRN

## 2011-01-30 NOTE — Progress Notes (Signed)
Subjective:    Patient ID: Charlotte Meyer, female    DOB: [birth year], 45 y.o.   MRN: [id number]  HPI  The patient is here for a wellness exam. The patient has been doing well overall without major physical or psychological issues going on lately.Review of Systems  Constitutional: Positive for unexpected weight change. Negative for fever, chills, diaphoresis, activity change, appetite change and fatigue.  HENT: Negative for hearing loss, ear pain, nosebleeds, congestion, sore throat, facial swelling, rhinorrhea, sneezing, mouth sores, trouble swallowing, neck pain, neck stiffness, postnasal drip, sinus pressure and tinnitus.   Eyes: Negative for pain, discharge, redness, itching and visual disturbance.  Respiratory: Negative for cough, chest tightness, shortness of breath, wheezing and stridor.   Cardiovascular: Negative for chest pain, palpitations and leg swelling.  Gastrointestinal: Negative for nausea, diarrhea, constipation, blood in stool, abdominal distention, anal bleeding and rectal pain.  Genitourinary: Negative for dysuria, urgency, frequency, hematuria, flank pain, vaginal bleeding, vaginal discharge, difficulty urinating, genital sores and pelvic pain.  Musculoskeletal: Positive for back pain. Negative for joint swelling, arthralgias and gait problem.  Skin: Negative.  Negative for rash.  Neurological: Negative for dizziness, tremors, seizures, syncope, speech difficulty, weakness, numbness and headaches.  Hematological: Negative for adenopathy. Does not bruise/bleed easily.  Psychiatric/Behavioral: Negative for suicidal ideas, behavioral problems, sleep disturbance, dysphoric mood and decreased concentration. The patient is nervous/anxious.    Lab Results  Component Value Date   WBC 7.2 01/25/2011   HGB 11.9* 01/25/2011   HCT 36.4 01/25/2011   PLT 190.0 01/25/2011   CHOL 190 01/25/2011   TRIG 66.0 01/25/2011   HDL 67.80 01/25/2011   LDLDIRECT 144.0 06/14/2010   ALT 11 01/25/2011   AST 12 01/25/2011   NA 141 01/25/2011   K 3.7 01/25/2011   CL 109 01/25/2011   CREATININE 0.7 01/25/2011   BUN 9 01/25/2011   CO2 25 01/25/2011   TSH 2.07 01/25/2011        Objective:   Physical Exam  Constitutional: She appears well-developed. No distress.       Obese  HENT:  Head: Normocephalic.  Right Ear: External ear normal.  Left Ear: External ear normal.  Nose: Nose normal.  Mouth/Throat: Oropharynx is clear and moist.  Eyes: Conjunctivae are normal. Pupils are equal, round, and reactive to light. Right eye exhibits no discharge. Left eye exhibits no discharge.  Neck: Normal range of motion. Neck supple. No JVD present. No tracheal deviation present. No thyromegaly present.  Cardiovascular: Normal rate, regular rhythm and normal heart sounds.   Pulmonary/Chest: No stridor. No respiratory distress. She has no wheezes.  Abdominal: Soft. Bowel sounds are normal. She exhibits no distension and no mass. There is no tenderness. There is no rebound and no guarding.  Musculoskeletal: She exhibits tenderness (LS is tender). She exhibits no edema.  Lymphadenopathy:    She has no cervical adenopathy.  Neurological: She displays normal reflexes. No cranial nerve deficit. She exhibits normal muscle tone. Coordination normal.  Skin: No rash noted. No erythema.  Psychiatric: She has a normal mood and affect. Her behavior is normal. Judgment and thought content normal.     Lab Results  Component Value Date   WBC 7.2 01/25/2011   HGB 11.9* 01/25/2011   HCT 36.4 01/25/2011   PLT 190.0 01/25/2011   CHOL 190 01/25/2011   TRIG 66.0 01/25/2011   HDL 67.80 01/25/2011   LDLDIRECT 144.0 06/14/2010   ALT 11 01/25/2011   AST 12 01/25/2011   NA 141 01/25/2011  K 3.7 01/25/2011   CL 109 01/25/2011   CREATININE 0.7 01/25/2011   BUN 9 01/25/2011   CO2 25 01/25/2011   TSH 2.07 01/25/2011        Assessment & Plan:

## 2011-01-30 NOTE — Assessment & Plan Note (Addendum)
Loose wt We discussed age appropriate health related issues, including available/recomended screening tests and vaccinations. We discussed a need for adhering to healthy diet and exercise. Labs/EKG were reviewed/ordered. All questions were answered.

## 2011-01-30 NOTE — Assessment & Plan Note (Signed)
Wt Readings from Last 3 Encounters:  01/30/11 335 lb (151.955 kg)  05/02/10 318 lb (144.244 kg)  12/14/09 335 lb (151.955 kg)

## 2011-01-30 NOTE — Assessment & Plan Note (Signed)
Cont Rx prn 

## 2011-01-30 NOTE — Assessment & Plan Note (Signed)
Doing well 

## 2011-01-30 NOTE — Assessment & Plan Note (Signed)
She will discuss w/Dr Arlyce Dice

## 2011-01-30 NOTE — Assessment & Plan Note (Signed)
Off Rx 

## 2011-04-09 ENCOUNTER — Other Ambulatory Visit: Payer: Self-pay | Admitting: Obstetrics & Gynecology

## 2011-04-09 DIAGNOSIS — Z1231 Encounter for screening mammogram for malignant neoplasm of breast: Secondary | ICD-10-CM

## 2011-04-16 ENCOUNTER — Ambulatory Visit (HOSPITAL_COMMUNITY)
Admission: RE | Admit: 2011-04-16 | Discharge: 2011-04-16 | Disposition: A | Discharge status: Home / Self Care | Payer: PRIVATE HEALTH INSURANCE | Source: Ambulatory Visit | Attending: Obstetrics & Gynecology | Admitting: Obstetrics & Gynecology

## 2011-04-16 DIAGNOSIS — Z1231 Encounter for screening mammogram for malignant neoplasm of breast: Secondary | ICD-10-CM | POA: Insufficient documentation

## 2011-05-29 ENCOUNTER — Encounter: Payer: PRIVATE HEALTH INSURANCE | Attending: Obstetrics & Gynecology | Admitting: *Deleted

## 2011-05-29 DIAGNOSIS — Z713 Dietary counseling and surveillance: Secondary | ICD-10-CM | POA: Insufficient documentation

## 2011-05-29 DIAGNOSIS — E669 Obesity, unspecified: Secondary | ICD-10-CM

## 2011-05-29 NOTE — Progress Notes (Signed)
Medical Nutrition Therapy:  Appt start time: 1200 end time:  1300.  Assessment:  Obesity.  Pt here for assessment of obesity. Reports 30-40 lb weight loss on plan given by chiropractor (11/2009), but has gained all back. States she can only eat chicken, most vegetables (except corn), and green beans d/t reaction of itchy nose and irregular bowel with other foods (???). Reports increased stress level right now and pain in both knees d/t her weight.  Pt has eliminated breads and snacking on sweets from diet, though excessive CHO intake still noted through soda and sugar (9 packets in morning coffee). Pt eats out often and no exercise noted. Does not plan to begin following nutrition recommendations until after the holidays.   MEDICATIONS: See medication list; reconciled with pt.   DIETARY INTAKE:  Usual eating pattern includes 3 meals and 0 snacks per day.  24-hr recall:  B ( AM): Eggs, sausage, grits, toast (from the [place]); 18 oz decaf coffee sugar (9 packets)  Snk ( AM): none (used to snack on sweets/etc) L ( PM): Chinese - tilapia (not sure if fried or baked), noodles (thin - lo mein); pepsi (12-14 oz) Snk ( PM): none D ( PM): Wings from [place] (6 pcs) OR Tilapia/Salmon Snk ( PM): None  Usual physical activity: None  Estimated energy needs: 1600-1700 calories 200 g carbohydrates 100 g protein 45 g fat  Progress Towards Goal(s):  In progress.   Nutritional Diagnosis:  [place]-3.3 Morbid obesity related to excessive CHO intake and sedentary lifestyle as evidenced by 24 hour dietary recall, patient reported lack of exercise, and a BMI of 53.2 kg/m^2.    Intervention/Goals:  Eat 3 meals/day, Avoid meal skipping.   Add lean protein-rich foods to all meals and snacks.  Limit carbohydrate 3 servings (45 grams) per meal and 1 serving (15 grams) per snack.  Choose more whole grains, lean protein, low-fat dairy, and fruits/non-starchy vegetables as tolerated.  Aim for >30 min of physical  activity daily.  Limit sugar-sweetened beverages, concentrated sweets, and high fat/fried foods.  Monitoring/Evaluation:  Dietary intake, exercise, and body weight on 07/15/11.

## 2011-05-30 ENCOUNTER — Encounter: Payer: Self-pay | Admitting: *Deleted

## 2011-05-30 NOTE — Patient Instructions (Signed)
Goals:  Eat 3 meals/day, Avoid meal skipping.   Add lean protein-rich foods to all meals and snacks.  Limit carbohydrate 3 servings (45 grams) per meal and 1 serving (15 grams) per snack.  Choose more whole grains, lean protein, low-fat dairy, and fruits/non-starchy vegetables as tolerated.  Aim for >30 min of physical activity daily.  Limit sugar-sweetened beverages, concentrated sweets, and high fat/fried foods.

## 2011-06-24 ENCOUNTER — Telehealth: Payer: Self-pay | Admitting: *Deleted

## 2011-06-25 NOTE — Telephone Encounter (Signed)
Returned pt call regarding other ways to get breakfast in. Left samples of CIB NAS at front desk. Instructed not to use long term.

## 2011-07-15 ENCOUNTER — Ambulatory Visit: Payer: PRIVATE HEALTH INSURANCE | Admitting: *Deleted

## 2011-07-30 ENCOUNTER — Ambulatory Visit: Payer: PRIVATE HEALTH INSURANCE | Admitting: *Deleted

## 2011-08-02 ENCOUNTER — Ambulatory Visit: Payer: PRIVATE HEALTH INSURANCE | Admitting: Internal Medicine

## 2011-08-02 DIAGNOSIS — Z0289 Encounter for other administrative examinations: Secondary | ICD-10-CM

## 2012-08-05 ENCOUNTER — Encounter: Payer: Self-pay | Admitting: Internal Medicine

## 2012-08-05 ENCOUNTER — Ambulatory Visit (INDEPENDENT_AMBULATORY_CARE_PROVIDER_SITE_OTHER): Payer: BC Managed Care – PPO | Admitting: Internal Medicine

## 2012-08-05 VITALS — BP 128/70 | HR 76 | Temp 98.4°F | Resp 16 | Wt 341.0 lb

## 2012-08-05 DIAGNOSIS — R5383 Other fatigue: Secondary | ICD-10-CM | POA: Insufficient documentation

## 2012-08-05 DIAGNOSIS — E785 Hyperlipidemia, unspecified: Secondary | ICD-10-CM

## 2012-08-05 DIAGNOSIS — E538 Deficiency of other specified B group vitamins: Secondary | ICD-10-CM

## 2012-08-05 DIAGNOSIS — R5381 Other malaise: Secondary | ICD-10-CM

## 2012-08-05 DIAGNOSIS — E669 Obesity, unspecified: Secondary | ICD-10-CM

## 2012-08-05 MED ORDER — CHOLECALCIFEROL 25 MCG (1000 UT) PO TABS: 1000.0000 [IU] | ORAL_TABLET | Freq: Every day | ORAL | Status: DC

## 2012-08-05 MED ORDER — HYDROCODONE-ACETAMINOPHEN 5-325 MG PO TABS: 1.0000 | ORAL_TABLET | Freq: Two times a day (BID) | ORAL | Status: DC | PRN

## 2012-08-05 MED ORDER — CYANOCOBALAMIN 1000 MCG/ML IJ SOLN: 1000.0000 ug | INTRAMUSCULAR | Status: DC

## 2012-08-05 MED ORDER — CYANOCOBALAMIN 1000 MCG/ML IJ SOLN
1000.0000 ug | Freq: Once | INTRAMUSCULAR | Status: AC
  Administered 2012-08-05: 1000 ug via INTRAMUSCULAR

## 2012-08-05 MED ORDER — NAPROXEN 500 MG PO TABS: 500.0000 mg | ORAL_TABLET | Freq: Two times a day (BID) | ORAL | Status: DC

## 2012-08-05 NOTE — Assessment & Plan Note (Signed)
Start sq inj q 1 mo

## 2012-08-05 NOTE — Progress Notes (Deleted)
  Subjective:    Patient ID: Charlotte Meyer, female    DOB: 07/26/1965, 47 y.o.   MRN: [id number]  HPI    Review of Systems     Objective:   Physical Exam        Assessment & Plan:

## 2012-08-05 NOTE — Assessment & Plan Note (Signed)
Low fat diet

## 2012-08-05 NOTE — Assessment & Plan Note (Signed)
Labs Be more compliant w/meds

## 2012-08-05 NOTE — Assessment & Plan Note (Signed)
Left Naproxen Hydroc/APAP prn

## 2012-08-05 NOTE — Assessment & Plan Note (Signed)
Naproxen Hydroc/APAP prn

## 2012-08-05 NOTE — Progress Notes (Signed)
Patient ID: Charlotte Meyer, female   DOB: 03-Oct-1965, 47 y.o.   MRN: [id number]  Subjective:    Patient ID: Charlotte Meyer, female    DOB: 1966/05/24, 47 y.o.   MRN: [id number]  Knee Pain  The incident occurred more than 1 week ago. There was no injury mechanism. The pain is present in the left leg and left knee. The quality of the pain is described as aching. The pain is moderate. The pain has been intermittent since onset. Pertinent negatives include no inability to bear weight, loss of motion, loss of sensation, muscle weakness, numbness or tingling. The symptoms are aggravated by movement and weight bearing. She has tried nothing for the symptoms. The treatment provided mild relief.    .Review of Systems  Constitutional: Positive for unexpected weight change. Negative for fever, chills, diaphoresis, activity change, appetite change and fatigue.  HENT: Negative for hearing loss, ear pain, nosebleeds, congestion, sore throat, facial swelling, rhinorrhea, sneezing, mouth sores, trouble swallowing, neck pain, neck stiffness, postnasal drip, sinus pressure and tinnitus.   Eyes: Negative for pain, discharge, redness, itching and visual disturbance.  Respiratory: Negative for cough, chest tightness, shortness of breath, wheezing and stridor.   Cardiovascular: Negative for chest pain, palpitations and leg swelling.  Gastrointestinal: Negative for nausea, diarrhea, constipation, blood in stool, abdominal distention, anal bleeding and rectal pain.  Genitourinary: Negative for dysuria, urgency, frequency, hematuria, flank pain, vaginal bleeding, vaginal discharge, difficulty urinating, genital sores and pelvic pain.  Musculoskeletal: Positive for back pain. Negative for joint swelling, arthralgias and gait problem.  Skin: Negative.  Negative for rash.  Neurological: Negative for dizziness, tingling, tremors, seizures, syncope, speech difficulty, weakness, numbness and headaches.  Hematological:  Negative for adenopathy. Does not bruise/bleed easily.  Psychiatric/Behavioral: Negative for suicidal ideas, behavioral problems, sleep disturbance, dysphoric mood and decreased concentration. The patient is nervous/anxious.    Lab Results  Component Value Date   WBC 7.2 01/25/2011   HGB 11.9* 01/25/2011   HCT 36.4 01/25/2011   PLT 190.0 01/25/2011   CHOL 190 01/25/2011   TRIG 66.0 01/25/2011   HDL 67.80 01/25/2011   LDLDIRECT 144.0 06/14/2010   ALT 11 01/25/2011   AST 12 01/25/2011   NA 141 01/25/2011   K 3.7 01/25/2011   CL 109 01/25/2011   CREATININE 0.7 01/25/2011   BUN 9 01/25/2011   CO2 25 01/25/2011   TSH 2.07 01/25/2011        Objective:   Physical Exam  Constitutional: She appears well-developed. No distress.  Obese  HENT:  Head: Normocephalic.  Right Ear: External ear normal.  Left Ear: External ear normal.  Nose: Nose normal.  Mouth/Throat: Oropharynx is clear and moist.  Eyes: Conjunctivae are normal. Pupils are equal, round, and reactive to light. Right eye exhibits no discharge. Left eye exhibits no discharge.  Neck: Normal range of motion. Neck supple. No JVD present. No tracheal deviation present. No thyromegaly present.  Cardiovascular: Normal rate, regular rhythm and normal heart sounds.   Pulmonary/Chest: No stridor. No respiratory distress. She has no wheezes.  Abdominal: Soft. Bowel sounds are normal. She exhibits no distension and no mass. There is no tenderness. There is no rebound and no guarding.  Musculoskeletal: She exhibits edema (trace B) and tenderness (LS is tender).  Lymphadenopathy:    She has no cervical adenopathy.  Neurological: She displays normal reflexes. No cranial nerve deficit. She exhibits normal muscle tone. Coordination normal.  Skin: No rash noted. No erythema. No pallor.  Psychiatric:  She has a normal mood and affect. Her behavior is normal. Judgment and thought content normal.   L post knee and lat shin are sensitive  Lab Results   Component Value Date   WBC 7.2 01/25/2011   HGB 11.9* 01/25/2011   HCT 36.4 01/25/2011   PLT 190.0 01/25/2011   CHOL 190 01/25/2011   TRIG 66.0 01/25/2011   HDL 67.80 01/25/2011   LDLDIRECT 144.0 06/14/2010   ALT 11 01/25/2011   AST 12 01/25/2011   NA 141 01/25/2011   K 3.7 01/25/2011   CL 109 01/25/2011   CREATININE 0.7 01/25/2011   BUN 9 01/25/2011   CO2 25 01/25/2011   TSH 2.07 01/25/2011        Assessment & Plan:

## 2012-08-05 NOTE — Assessment & Plan Note (Signed)
Wt loss discussed 

## 2012-09-02 ENCOUNTER — Other Ambulatory Visit: Payer: Self-pay | Admitting: *Deleted

## 2012-09-02 MED ORDER — NAPROXEN 500 MG PO TABS: 500.0000 mg | ORAL_TABLET | Freq: Two times a day (BID) | ORAL | Status: DC

## 2012-09-04 ENCOUNTER — Other Ambulatory Visit: Payer: Self-pay

## 2012-09-04 DIAGNOSIS — Z1231 Encounter for screening mammogram for malignant neoplasm of breast: Secondary | ICD-10-CM

## 2012-10-02 ENCOUNTER — Telehealth: Payer: Self-pay | Admitting: *Deleted

## 2012-10-02 ENCOUNTER — Ambulatory Visit
Admission: RE | Admit: 2012-10-02 | Discharge: 2012-10-02 | Disposition: A | Discharge status: Home / Self Care | Payer: BC Managed Care – PPO | Source: Ambulatory Visit

## 2012-10-02 DIAGNOSIS — R5383 Other fatigue: Secondary | ICD-10-CM

## 2012-10-02 DIAGNOSIS — D649 Anemia, unspecified: Secondary | ICD-10-CM

## 2012-10-02 DIAGNOSIS — Z1231 Encounter for screening mammogram for malignant neoplasm of breast: Secondary | ICD-10-CM

## 2012-10-02 DIAGNOSIS — E538 Deficiency of other specified B group vitamins: Secondary | ICD-10-CM

## 2012-10-02 IMAGING — MG MM DIGITAL SCREENING BILAT
6 series · 6 of 6 positions shown · non-contrast
Comparison: Previous exams.

CLINICAL DATA: Screening.

DIGITAL BILATERAL SCREENING MAMMOGRAM WITH CAD

[L MLO (1 of 2)]
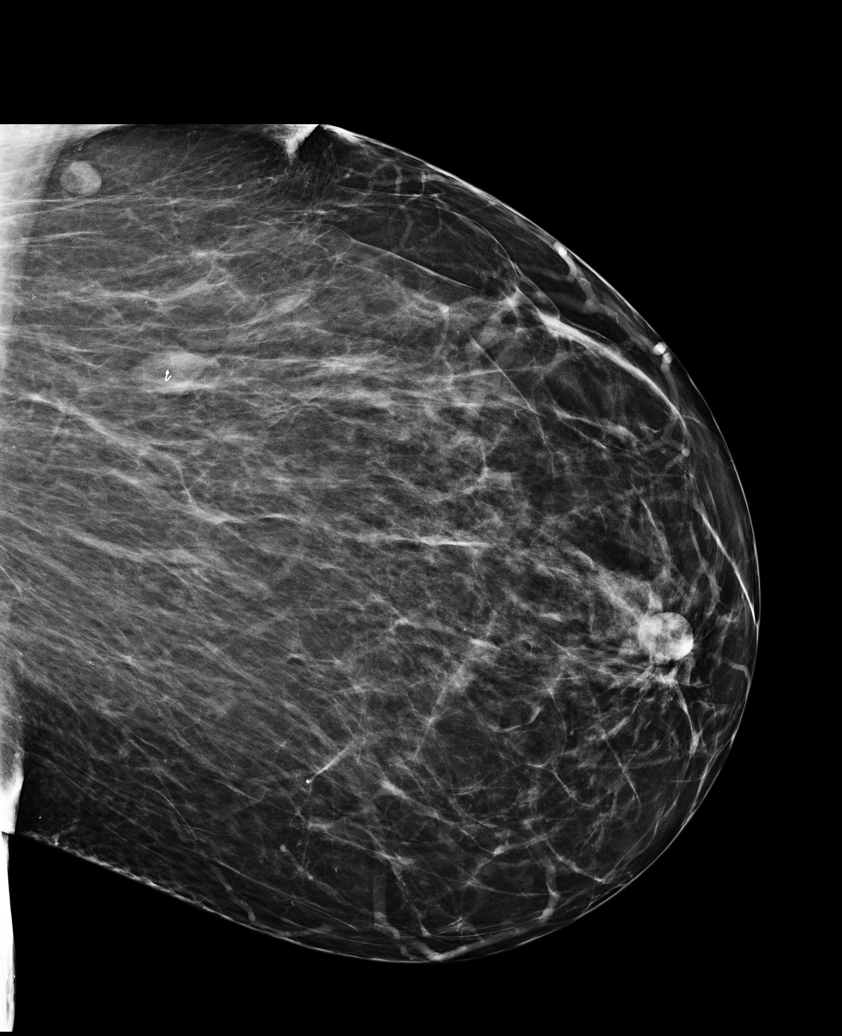

[L MLO (2 of 2)]
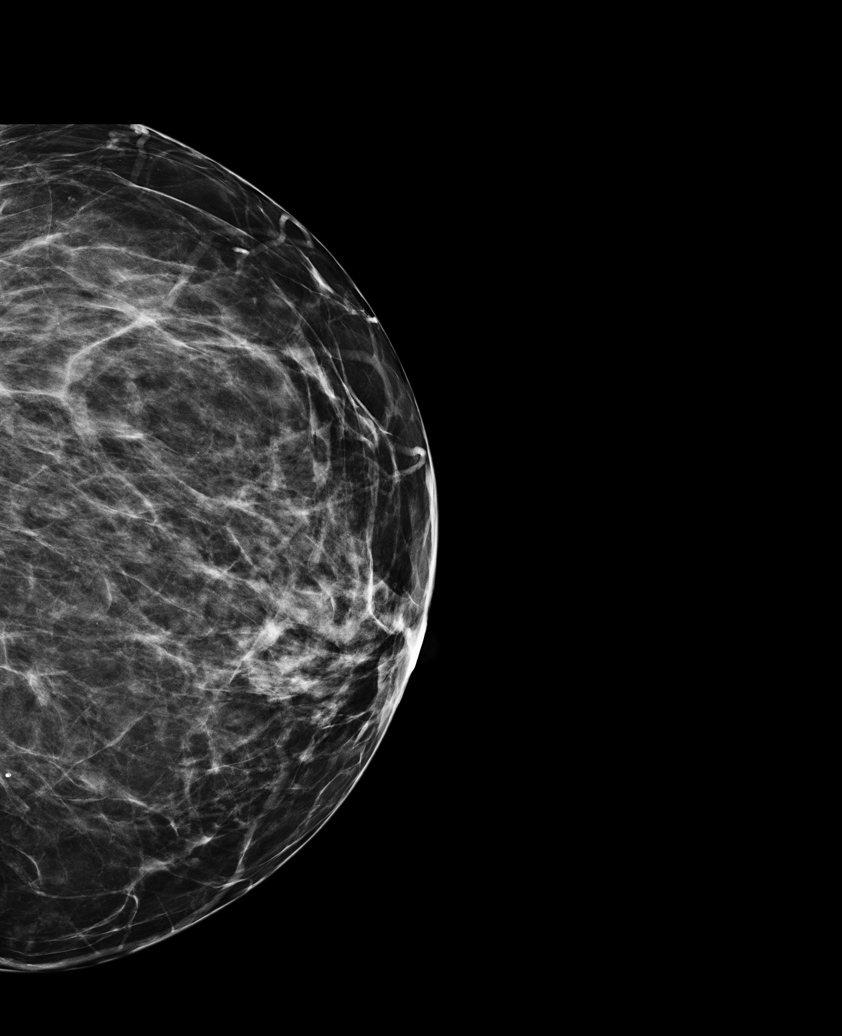

[R CC]
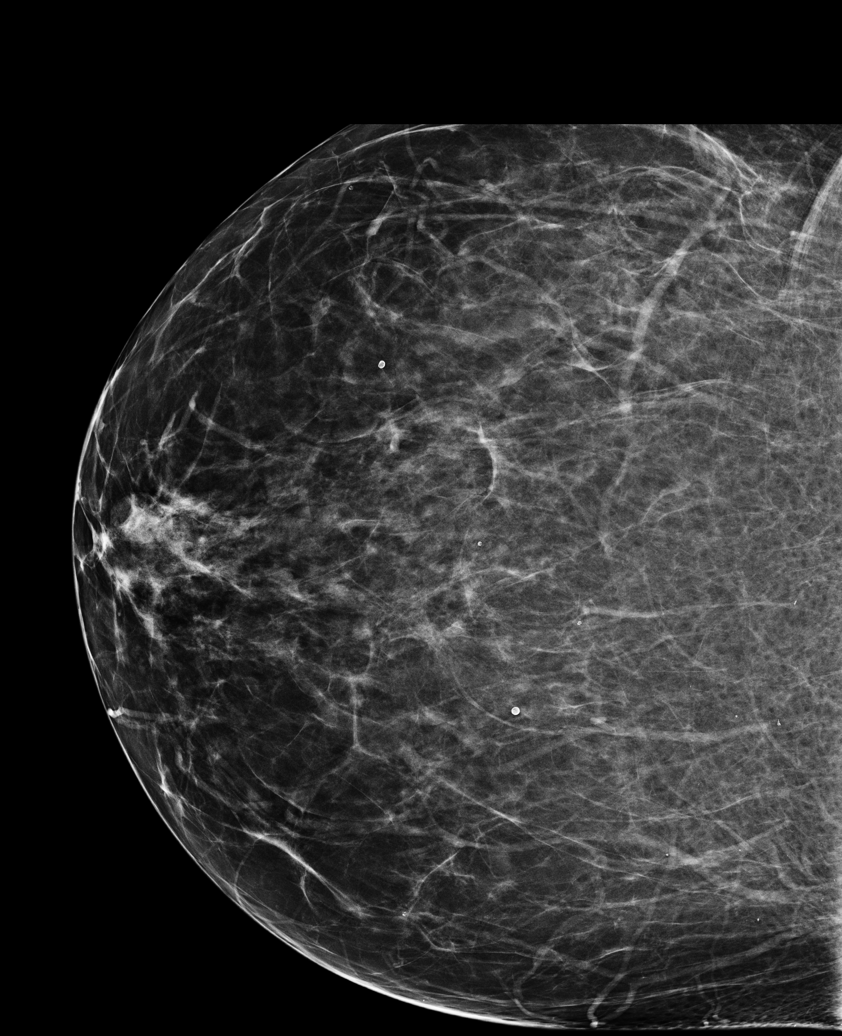

[L CC]
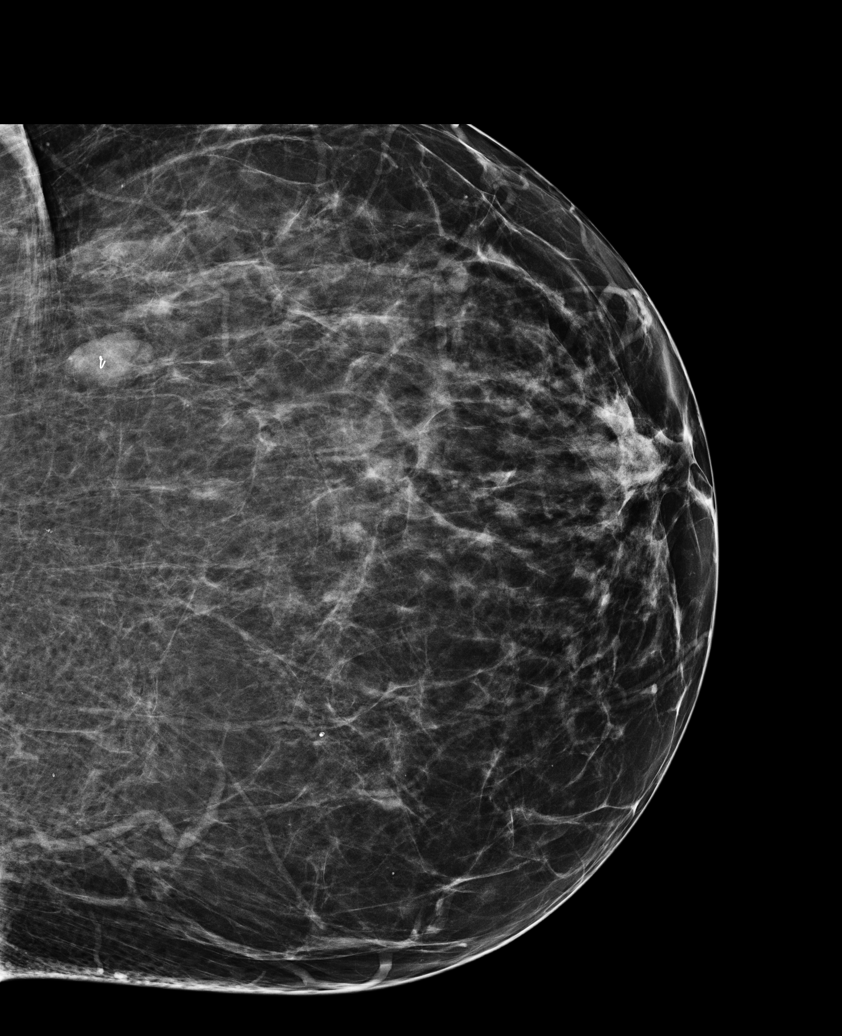

[R MLO (1 of 2)]
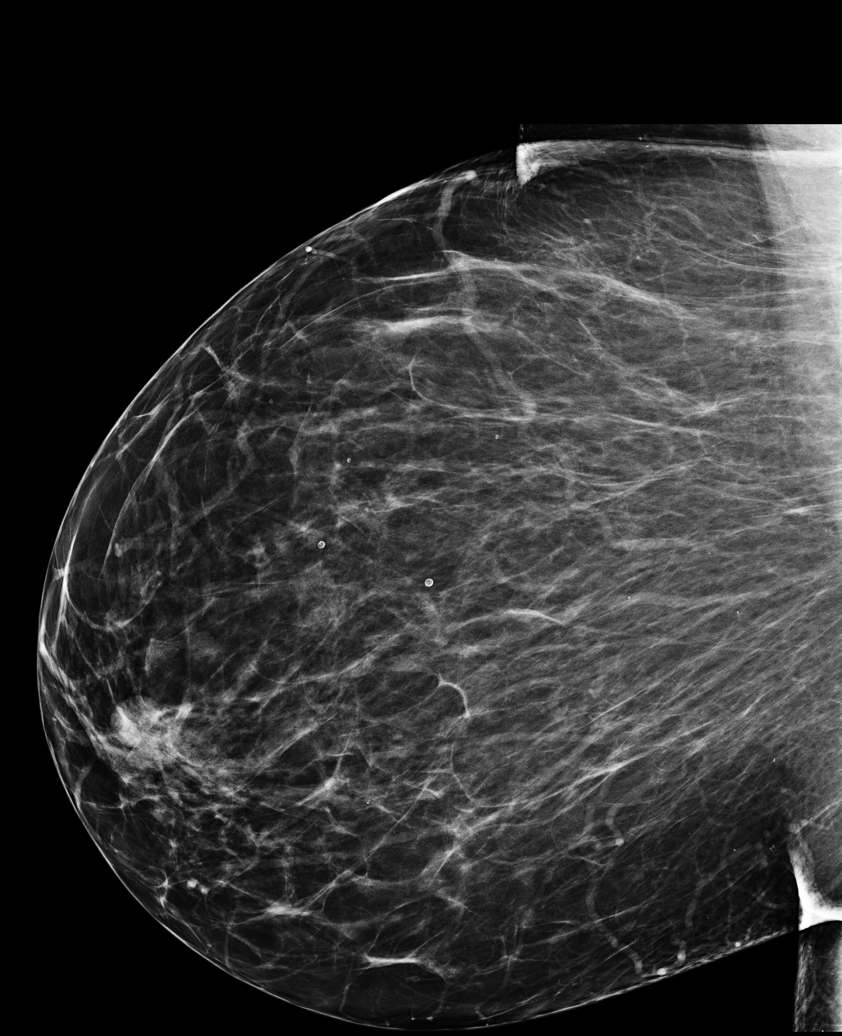

[R MLO (2 of 2)]
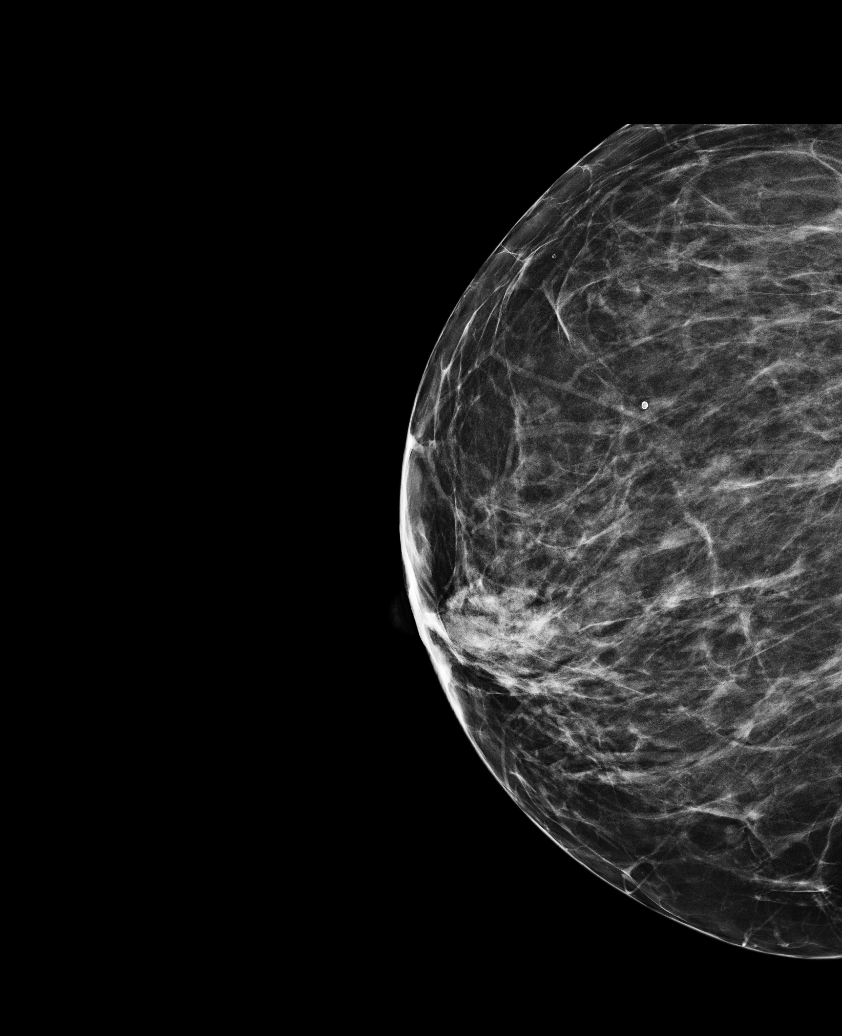

[6 of 6 positions shown; findings below may reference images not displayed]

FINDINGS: ACR Breast Density Category 2: There is a scattered fibroglandular
pattern.

No suspicious masses, architectural distortion, or calcifications
are present.

Images were processed with CAD.
IMPRESSION: No mammographic evidence of malignancy.

A result letter of this screening mammogram will be mailed directly
to the patient.

RECOMMENDATION:
Screening mammogram in one year. (Code:[FI])

BI-RADS CATEGORY 1:  Negative.

## 2012-10-02 NOTE — Telephone Encounter (Signed)
Pls check Vit B12, BMET, CBC 1-2 d before next injection. No need for oral B12 F/u OV Thx

## 2012-10-02 NOTE — Telephone Encounter (Signed)
Pt states she is currently taking the B12 injections, but she is still extremely tired. Should she be taking the b 12 tabets along with the injection. She has a f/u but its not until 11/06/12. Pls requesting recommendations what to do...lmb

## 2012-10-05 NOTE — Telephone Encounter (Signed)
Called pt no answer LMOM md response...lmb 

## 2012-10-07 ENCOUNTER — Other Ambulatory Visit: Payer: Self-pay | Admitting: *Deleted

## 2012-10-07 ENCOUNTER — Other Ambulatory Visit (INDEPENDENT_AMBULATORY_CARE_PROVIDER_SITE_OTHER): Payer: BC Managed Care – PPO

## 2012-10-07 ENCOUNTER — Telehealth: Payer: Self-pay | Admitting: Internal Medicine

## 2012-10-07 DIAGNOSIS — R5381 Other malaise: Secondary | ICD-10-CM

## 2012-10-07 DIAGNOSIS — Z0000 Encounter for general adult medical examination without abnormal findings: Secondary | ICD-10-CM

## 2012-10-07 DIAGNOSIS — D649 Anemia, unspecified: Secondary | ICD-10-CM

## 2012-10-07 DIAGNOSIS — R5383 Other fatigue: Secondary | ICD-10-CM

## 2012-10-07 LAB — HEPATIC FUNCTION PANEL
ALT: 14 U/L (ref 0–35)
Bilirubin, Direct: 0.1 mg/dL (ref 0.0–0.3)
Total Bilirubin: 1.1 mg/dL (ref 0.3–1.2)

## 2012-10-07 LAB — CBC WITH DIFFERENTIAL/PLATELET
Basophils Absolute: 0 10*3/uL (ref 0.0–0.1)
Eosinophils Absolute: 0.1 10*3/uL (ref 0.0–0.7)
Lymphocytes Relative: 41.7 % (ref 12.0–46.0)
MCHC: 33.8 g/dL (ref 30.0–36.0)
Neutro Abs: 3.3 10*3/uL (ref 1.4–7.7)
Neutrophils Relative %: 48.2 % (ref 43.0–77.0)
Platelets: 230 10*3/uL (ref 150.0–400.0)
RDW: 15.1 % — ABNORMAL HIGH (ref 11.5–14.6)

## 2012-10-07 LAB — LIPID PANEL
HDL: 58.8 mg/dL (ref >39.00)
LDL Cholesterol: 108 mg/dL — ABNORMAL HIGH (ref 0–99)
Total CHOL/HDL Ratio: 3
Triglycerides: 57 mg/dL (ref 0.0–149.0)

## 2012-10-07 LAB — URINALYSIS, ROUTINE W REFLEX MICROSCOPIC
Bilirubin Urine: NEGATIVE
Ketones, ur: NEGATIVE
Urine Glucose: NEGATIVE
Urobilinogen, UA: 0.2 (ref 0.0–1.0)

## 2012-10-07 LAB — BASIC METABOLIC PANEL
Chloride: 106 mEq/L (ref 96–112)
Creatinine, Ser: 0.6 mg/dL (ref 0.4–1.2)
Potassium: 3.7 mEq/L (ref 3.5–5.1)

## 2012-10-07 LAB — IBC PANEL
Iron: 57 ug/dL (ref 42–145)
Transferrin: 246.2 mg/dL (ref 212.0–360.0)

## 2012-10-07 NOTE — Telephone Encounter (Signed)
Forwarding msg to Dr. Posey Rea assistant Misty Stanley...lmb

## 2012-10-07 NOTE — Telephone Encounter (Signed)
Patient is requesting a call back regarding labs.  Patient would not be very specific.

## 2012-10-07 NOTE — Telephone Encounter (Signed)
I called pt- she wanted to discuss her labs. I advised her the labs were entered and for her to keep her appt as scheduled on 11/06/12.

## 2012-11-06 ENCOUNTER — Encounter: Payer: Self-pay | Admitting: Internal Medicine

## 2012-11-06 ENCOUNTER — Ambulatory Visit (INDEPENDENT_AMBULATORY_CARE_PROVIDER_SITE_OTHER): Payer: BC Managed Care – PPO | Admitting: Internal Medicine

## 2012-11-06 VITALS — BP 120/78 | HR 84 | Temp 98.7°F | Resp 16 | Ht 65.5 in | Wt 343.0 lb

## 2012-11-06 DIAGNOSIS — K219 Gastro-esophageal reflux disease without esophagitis: Secondary | ICD-10-CM

## 2012-11-06 DIAGNOSIS — E669 Obesity, unspecified: Secondary | ICD-10-CM

## 2012-11-06 DIAGNOSIS — E538 Deficiency of other specified B group vitamins: Secondary | ICD-10-CM

## 2012-11-06 DIAGNOSIS — I8310 Varicose veins of unspecified lower extremity with inflammation: Secondary | ICD-10-CM

## 2012-11-06 DIAGNOSIS — I872 Venous insufficiency (chronic) (peripheral): Secondary | ICD-10-CM

## 2012-11-06 DIAGNOSIS — M545 Low back pain, unspecified: Secondary | ICD-10-CM

## 2012-11-06 DIAGNOSIS — Z0000 Encounter for general adult medical examination without abnormal findings: Secondary | ICD-10-CM

## 2012-11-06 MED ORDER — FEXOFENADINE-PSEUDOEPHED ER 180-240 MG PO TB24: 1.0000 | ORAL_TABLET | Freq: Every day | ORAL | Status: DC

## 2012-11-06 MED ORDER — HYDROCODONE-ACETAMINOPHEN 5-325 MG PO TABS: 1.0000 | ORAL_TABLET | Freq: Two times a day (BID) | ORAL | Status: DC | PRN

## 2012-11-06 MED ORDER — CYANOCOBALAMIN 1000 MCG/ML IJ SOLN: 1000.0000 ug | INTRAMUSCULAR | Status: DC

## 2012-11-06 MED ORDER — CHOLECALCIFEROL 25 MCG (1000 UT) PO TABS: 1000.0000 [IU] | ORAL_TABLET | Freq: Every day | ORAL | Status: DC

## 2012-11-06 MED ORDER — TRIAMCINOLONE ACETONIDE 0.5 % EX CREA: TOPICAL_CREAM | Freq: Three times a day (TID) | CUTANEOUS | Status: DC

## 2012-11-06 MED ORDER — PANTOPRAZOLE SODIUM 40 MG PO TBEC: 40.0000 mg | DELAYED_RELEASE_TABLET | Freq: Every day | ORAL | Status: DC

## 2012-11-06 NOTE — Assessment & Plan Note (Signed)
See meds 

## 2012-11-06 NOTE — Assessment & Plan Note (Signed)
Loose wt Triamc cream prn

## 2012-11-06 NOTE — Assessment & Plan Note (Signed)
Discussed wt loss Conpr hose

## 2012-11-06 NOTE — Progress Notes (Signed)
   Subjective:    HPI  The patient is here for a wellness exam. C/o B leg pain and swelling. C/o LBP. C/o rash on B shins  Wt Readings from Last 3 Encounters:  11/06/12 343 lb (155.584 kg)  08/05/12 341 lb (154.677 kg)  05/29/11 340 lb (154.223 kg)   BP Readings from Last 3 Encounters:  11/06/12 120/78  08/05/12 128/70  01/30/11 120/80     .Review of Systems  Constitutional: Positive for unexpected weight change. Negative for fever, chills, diaphoresis, activity change, appetite change and fatigue.  HENT: Negative for hearing loss, ear pain, nosebleeds, congestion, sore throat, facial swelling, rhinorrhea, sneezing, mouth sores, trouble swallowing, neck pain, neck stiffness, postnasal drip, sinus pressure and tinnitus.   Eyes: Negative for pain, discharge, redness, itching and visual disturbance.  Respiratory: Negative for cough, chest tightness, shortness of breath, wheezing and stridor.   Cardiovascular: Negative for chest pain, palpitations and leg swelling.  Gastrointestinal: Negative for nausea, diarrhea, constipation, blood in stool, abdominal distention, anal bleeding and rectal pain.  Genitourinary: Negative for dysuria, urgency, frequency, hematuria, flank pain, vaginal bleeding, vaginal discharge, difficulty urinating, genital sores and pelvic pain.  Musculoskeletal: Positive for back pain. Negative for joint swelling, arthralgias and gait problem.  Skin: Negative.  Negative for rash.  Neurological: Negative for dizziness, tremors, seizures, syncope, speech difficulty, weakness, numbness and headaches.  Hematological: Negative for adenopathy. Does not bruise/bleed easily.  Psychiatric/Behavioral: Negative for suicidal ideas, behavioral problems, sleep disturbance, dysphoric mood and decreased concentration. The patient is nervous/anxious.          Objective:   Physical Exam  Constitutional: She appears well-developed. No distress.  Obese  HENT:  Head:  Normocephalic.  Right Ear: External ear normal.  Left Ear: External ear normal.  Nose: Nose normal.  Mouth/Throat: Oropharynx is clear and moist.  Eyes: Conjunctivae are normal. Pupils are equal, round, and reactive to light. Right eye exhibits no discharge. Left eye exhibits no discharge.  Neck: Normal range of motion. Neck supple. No JVD present. No tracheal deviation present. No thyromegaly present.  Cardiovascular: Normal rate, regular rhythm and normal heart sounds.   Pulmonary/Chest: No stridor. No respiratory distress. She has no wheezes.  Abdominal: Soft. Bowel sounds are normal. She exhibits no distension and no mass. There is no tenderness. There is no rebound and no guarding.  Musculoskeletal: She exhibits tenderness (LS is tender). She exhibits no edema.  Lymphadenopathy:    She has no cervical adenopathy.  Neurological: She displays normal reflexes. No cranial nerve deficit. She exhibits normal muscle tone. Coordination normal.  Skin: No rash noted. No erythema.  Psychiatric: She has a normal mood and affect. Her behavior is normal. Judgment and thought content normal.  LE w/trace edema B Hyperpigmented ankles, mild erythema    Lab Results  Component Value Date   WBC 6.8 10/07/2012   HGB 12.8 10/07/2012   HCT 37.9 10/07/2012   PLT 230.0 10/07/2012   CHOL 178 10/07/2012   TRIG 57.0 10/07/2012   HDL 58.80 10/07/2012   LDLDIRECT 144.0 06/14/2010   ALT 14 10/07/2012   AST 15 10/07/2012   NA 139 10/07/2012   K 3.7 10/07/2012   CL 106 10/07/2012   CREATININE 0.6 10/07/2012   BUN 9 10/07/2012   CO2 29 10/07/2012   TSH 2.09 10/07/2012        Assessment & Plan:

## 2012-11-06 NOTE — Assessment & Plan Note (Signed)
Continue with current prescription therapy as reflected on the Med list.  

## 2012-11-06 NOTE — Patient Instructions (Addendum)
[email or web address] 

## 2012-11-06 NOTE — Assessment & Plan Note (Signed)
We discussed age appropriate health related issues, including available/recomended screening tests and vaccinations. We discussed a need for adhering to healthy diet and exercise. Labs/EKG were reviewed/ordered. All questions were answered.   

## 2012-11-06 NOTE — Assessment & Plan Note (Signed)
5/14 Lap band discussed

## 2013-04-15 ENCOUNTER — Other Ambulatory Visit: Payer: Self-pay

## 2013-05-14 ENCOUNTER — Ambulatory Visit: Payer: BC Managed Care – PPO | Admitting: Internal Medicine

## 2013-08-13 ENCOUNTER — Encounter: Payer: Self-pay | Admitting: Internal Medicine

## 2013-08-13 ENCOUNTER — Ambulatory Visit (INDEPENDENT_AMBULATORY_CARE_PROVIDER_SITE_OTHER): Payer: BC Managed Care – PPO | Admitting: Internal Medicine

## 2013-08-13 VITALS — BP 130/80 | HR 80 | Temp 98.7°F | Resp 16 | Wt 336.0 lb

## 2013-08-13 DIAGNOSIS — E538 Deficiency of other specified B group vitamins: Secondary | ICD-10-CM

## 2013-08-13 DIAGNOSIS — M25519 Pain in unspecified shoulder: Secondary | ICD-10-CM

## 2013-08-13 DIAGNOSIS — M545 Low back pain, unspecified: Secondary | ICD-10-CM

## 2013-08-13 MED ORDER — ERGOCALCIFEROL 1.25 MG (50000 UT) PO CAPS: 50000.0000 [IU] | ORAL_CAPSULE | ORAL | Status: DC

## 2013-08-13 MED ORDER — NABUMETONE 500 MG PO TABS: 500.0000 mg | ORAL_TABLET | Freq: Two times a day (BID) | ORAL | Status: DC | PRN

## 2013-08-13 MED ORDER — TRIAMCINOLONE ACETONIDE 0.5 % EX CREA: TOPICAL_CREAM | Freq: Three times a day (TID) | CUTANEOUS | Status: DC

## 2013-08-13 MED ORDER — HYDROCODONE-ACETAMINOPHEN 5-325 MG PO TABS: 1.0000 | ORAL_TABLET | Freq: Two times a day (BID) | ORAL | Status: DC | PRN

## 2013-08-13 MED ORDER — CLONAZEPAM 0.5 MG PO TABS: 0.5000 mg | ORAL_TABLET | Freq: Two times a day (BID) | ORAL | Status: DC | PRN

## 2013-08-13 NOTE — Progress Notes (Signed)
Pre visit review using our clinic review tool, if applicable. No additional management support is needed unless otherwise documented below in the visit note. 

## 2013-08-13 NOTE — Progress Notes (Signed)
   Subjective:    HPI  C/o anxiety and stress C/o R shoulder pain x wks  Wt Readings from Last 3 Encounters:  08/13/13 336 lb (152.409 kg)  11/06/12 343 lb (155.584 kg)  08/05/12 341 lb (154.677 kg)   BP Readings from Last 3 Encounters:  08/13/13 130/80  11/06/12 120/78  08/05/12 128/70     .Review of Systems  Constitutional: Positive for unexpected weight change. Negative for fever, chills, diaphoresis, activity change, appetite change and fatigue.  HENT: Negative for congestion, ear pain, facial swelling, hearing loss, mouth sores, nosebleeds, postnasal drip, rhinorrhea, sinus pressure, sneezing, sore throat, tinnitus and trouble swallowing.   Eyes: Negative for pain, discharge, redness, itching and visual disturbance.  Respiratory: Negative for cough, chest tightness, shortness of breath, wheezing and stridor.   Cardiovascular: Negative for chest pain, palpitations and leg swelling.  Gastrointestinal: Negative for nausea, diarrhea, constipation, blood in stool, abdominal distention, anal bleeding and rectal pain.  Genitourinary: Negative for dysuria, urgency, frequency, hematuria, flank pain, vaginal bleeding, vaginal discharge, difficulty urinating, genital sores and pelvic pain.  Musculoskeletal: Positive for back pain. Negative for arthralgias, gait problem, joint swelling, neck pain and neck stiffness.  Skin: Negative.  Negative for rash.  Neurological: Negative for dizziness, tremors, seizures, syncope, speech difficulty, weakness, numbness and headaches.  Hematological: Negative for adenopathy. Does not bruise/bleed easily.  Psychiatric/Behavioral: Negative for suicidal ideas, behavioral problems, sleep disturbance, dysphoric mood and decreased concentration. The patient is nervous/anxious.          Objective:   Physical Exam  Constitutional: She appears well-developed. No distress.  Obese  HENT:  Head: Normocephalic.  Right Ear: External ear normal.  Left Ear:  External ear normal.  Nose: Nose normal.  Mouth/Throat: Oropharynx is clear and moist.  Eyes: Conjunctivae are normal. Pupils are equal, round, and reactive to light. Right eye exhibits no discharge. Left eye exhibits no discharge.  Neck: Normal range of motion. Neck supple. No JVD present. No tracheal deviation present. No thyromegaly present.  Cardiovascular: Normal rate, regular rhythm and normal heart sounds.   Pulmonary/Chest: No stridor. No respiratory distress. She has no wheezes.  Abdominal: Soft. Bowel sounds are normal. She exhibits no distension and no mass. There is no tenderness. There is no rebound and no guarding.  Musculoskeletal: She exhibits tenderness (LS is tender). She exhibits no edema.  Lymphadenopathy:    She has no cervical adenopathy.  Neurological: She displays normal reflexes. No cranial nerve deficit. She exhibits normal muscle tone. Coordination normal.  Skin: No rash noted. No erythema.  Psychiatric: She has a normal mood and affect. Her behavior is normal. Judgment and thought content normal.  LE w/trace edema B Hyperpigmented ankles, mild erythema R shoulder is tender    Lab Results  Component Value Date   WBC 6.8 10/07/2012   HGB 12.8 10/07/2012   HCT 37.9 10/07/2012   PLT 230.0 10/07/2012   CHOL 178 10/07/2012   TRIG 57.0 10/07/2012   HDL 58.80 10/07/2012   LDLDIRECT 144.0 06/14/2010   ALT 14 10/07/2012   AST 15 10/07/2012   NA 139 10/07/2012   K 3.7 10/07/2012   CL 106 10/07/2012   CREATININE 0.6 10/07/2012   BUN 9 10/07/2012   CO2 29 10/07/2012   TSH 2.09 10/07/2012        Assessment & Plan:

## 2013-08-23 ENCOUNTER — Encounter: Payer: Self-pay | Admitting: Internal Medicine

## 2013-08-23 NOTE — Assessment & Plan Note (Signed)
Continue with current prescription therapy as reflected on the Med list.  

## 2013-08-23 NOTE — Assessment & Plan Note (Signed)
See Rx Pt declined injection ROM exercises

## 2013-09-13 ENCOUNTER — Other Ambulatory Visit (HOSPITAL_COMMUNITY): Payer: Self-pay | Admitting: Chiropractic Medicine

## 2013-09-13 ENCOUNTER — Ambulatory Visit (HOSPITAL_COMMUNITY)
Admission: RE | Admit: 2013-09-13 | Discharge: 2013-09-13 | Disposition: A | Discharge status: Home / Self Care | Payer: BC Managed Care – PPO | Source: Ambulatory Visit | Attending: Chiropractic Medicine | Admitting: Chiropractic Medicine

## 2013-09-13 DIAGNOSIS — M25519 Pain in unspecified shoulder: Secondary | ICD-10-CM | POA: Insufficient documentation

## 2013-09-13 DIAGNOSIS — R52 Pain, unspecified: Secondary | ICD-10-CM

## 2013-09-13 DIAGNOSIS — M259 Joint disorder, unspecified: Secondary | ICD-10-CM | POA: Insufficient documentation

## 2013-09-13 IMAGING — CR DG SHOULDER 2+V*R*
3 series · 3 of 3 positions shown · non-contrast
Comparison: None.

CLINICAL DATA: Right shoulder pain.  Denies injury.

EXAM:
RIGHT SHOULDER - 2+ VIEW

[w shoulder ap external right]
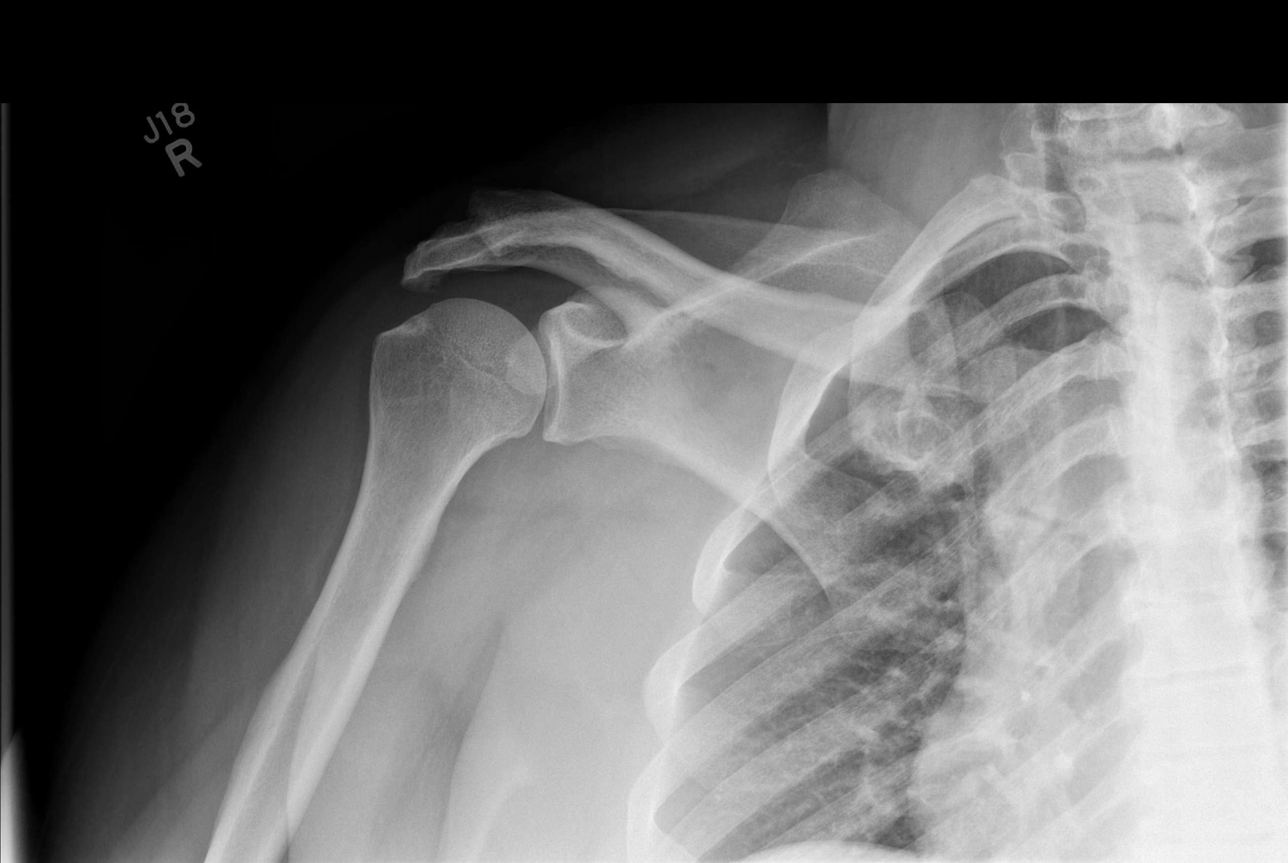

[w shoulder y view right]
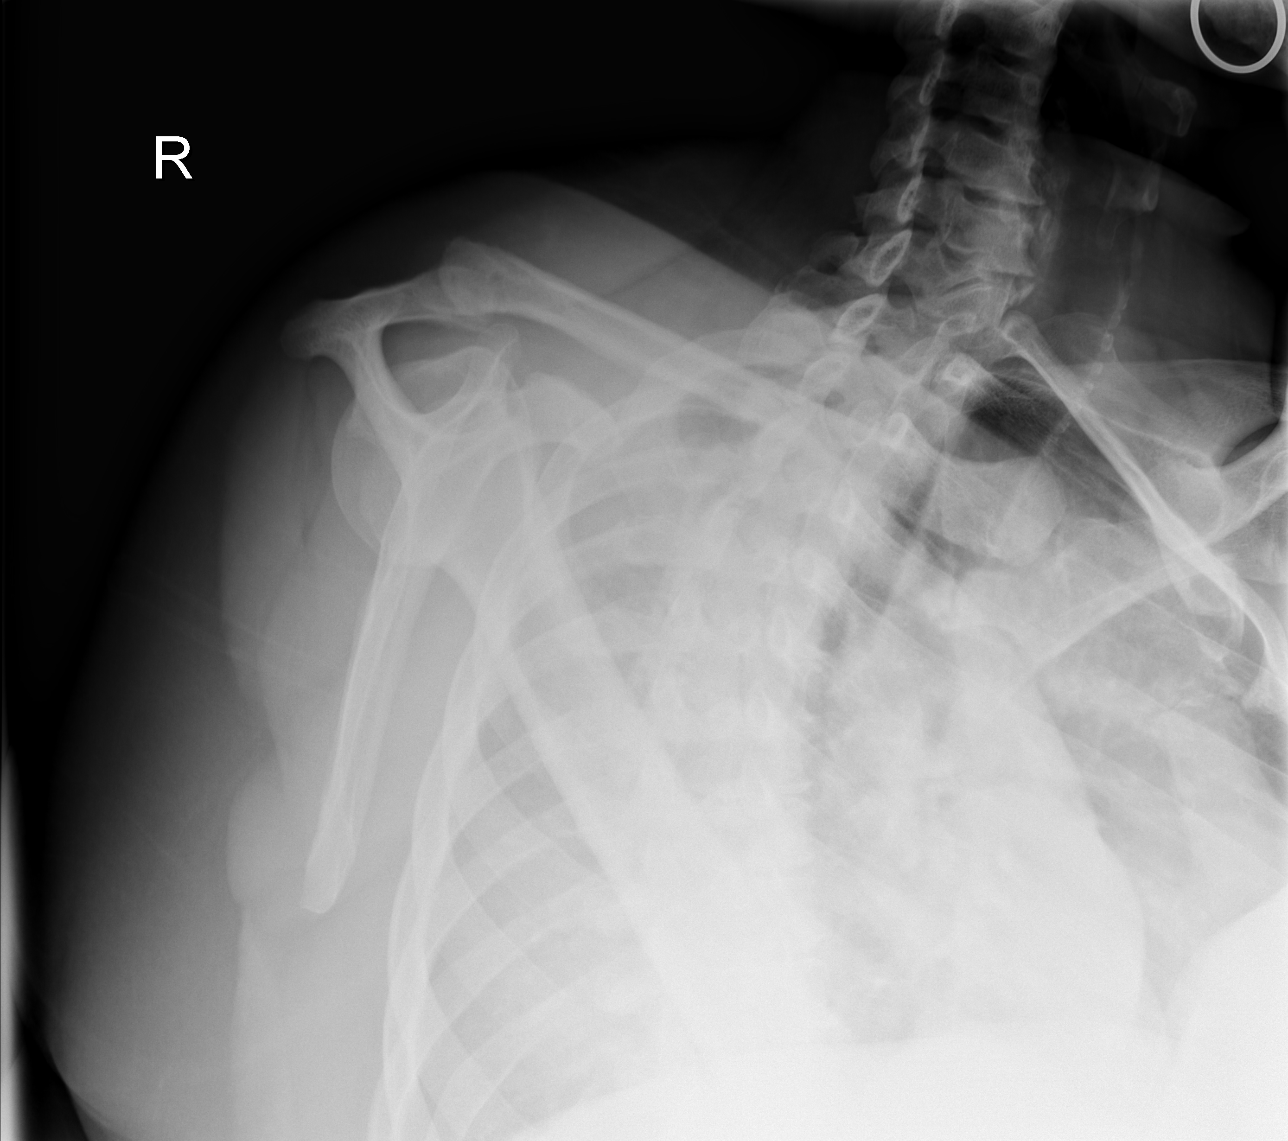

[x shoulder axillary right]
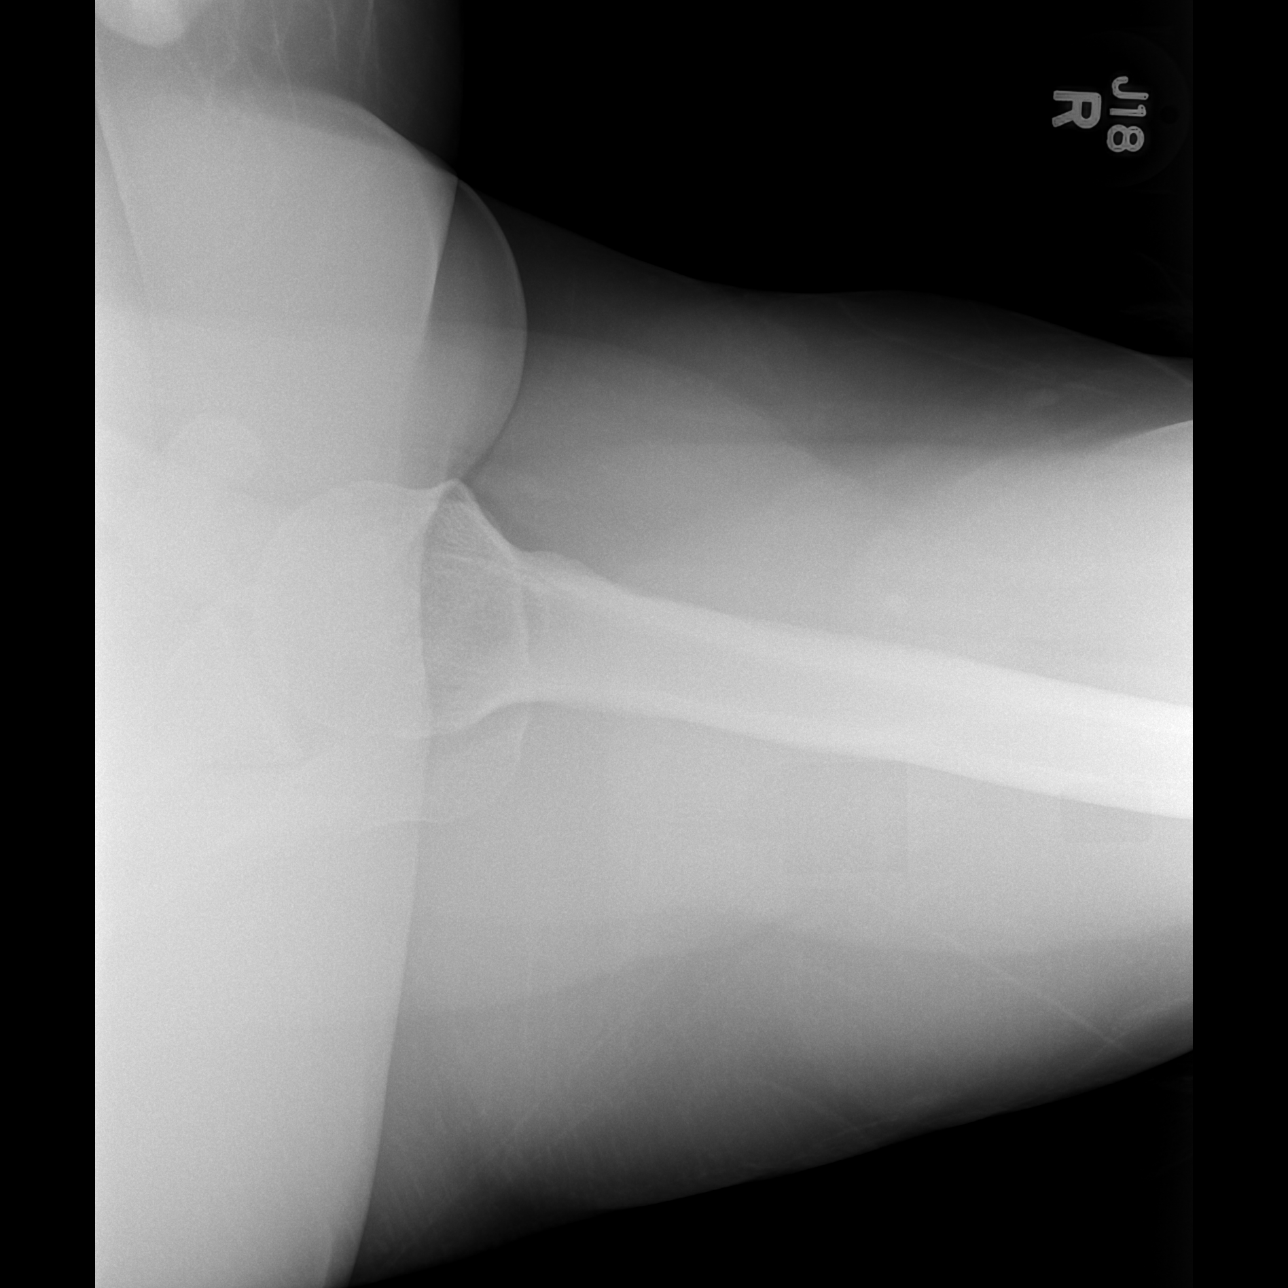

[3 of 3 positions shown; findings below may reference images not displayed]

FINDINGS: No fracture or dislocation.

Mild to moderate acromioclavicular joint degenerative changes.
Slight curved appearance of the acromion. These findings in
combination may contribute to mild impingement upon the underlying
rotator cuff.

No soft tissue calcifications.

Visualized lungs clear.
IMPRESSION: Mild to moderate acromioclavicular joint degenerative changes.
Slight curved appearance of the acromion. These findings in
combination may contribute to mild impingement upon the underlying
rotator cuff.

## 2013-09-20 ENCOUNTER — Encounter (INDEPENDENT_AMBULATORY_CARE_PROVIDER_SITE_OTHER): Payer: BC Managed Care – PPO | Admitting: Ophthalmology

## 2013-09-20 DIAGNOSIS — H2510 Age-related nuclear cataract, unspecified eye: Secondary | ICD-10-CM

## 2013-09-20 DIAGNOSIS — Q143 Congenital malformation of choroid: Secondary | ICD-10-CM

## 2013-09-20 DIAGNOSIS — H43819 Vitreous degeneration, unspecified eye: Secondary | ICD-10-CM

## 2013-09-21 ENCOUNTER — Other Ambulatory Visit: Payer: Self-pay | Admitting: Obstetrics & Gynecology

## 2013-09-21 DIAGNOSIS — N644 Mastodynia: Secondary | ICD-10-CM

## 2013-09-23 ENCOUNTER — Encounter: Payer: Self-pay | Admitting: Internal Medicine

## 2013-09-23 ENCOUNTER — Ambulatory Visit (INDEPENDENT_AMBULATORY_CARE_PROVIDER_SITE_OTHER): Payer: BC Managed Care – PPO | Admitting: Internal Medicine

## 2013-09-23 VITALS — BP 130/84 | HR 82 | Temp 98.7°F | Resp 14 | Wt 338.8 lb

## 2013-09-23 DIAGNOSIS — J209 Acute bronchitis, unspecified: Secondary | ICD-10-CM

## 2013-09-23 MED ORDER — AZITHROMYCIN 250 MG PO TABS: ORAL_TABLET | ORAL | Status: DC

## 2013-09-23 MED ORDER — HYDROCODONE-HOMATROPINE 5-1.5 MG/5ML PO SYRP: 5.0000 mL | ORAL_SOLUTION | Freq: Four times a day (QID) | ORAL | Status: DC | PRN

## 2013-09-23 NOTE — Progress Notes (Signed)
   Subjective:    Patient ID: Charlotte Meyer, female    DOB: 1966-01-16, 48 y.o.   MRN: [id number]  HPI   Symptoms began 3 weeks ago as a cough after exposure. This has resulted in substernal and diaphragmatic area discomfort with her short bursts of coughing. She is producing yellow/green sputum from chest and throat. NyQuil and cough drops have only been partially beneficial  She has some itchy, watery eyes.  She quit smoking at least 10 years ago. She has no history of asthma    Review of Systems  She specifically denies fever, chills, or sweats.  She's had no associated pleuritic pain, wheezing, or shortness of breath.  Reflux is not significant.  She specifically denies frontal headache, facial pain, nasal purulence.       Objective:   Physical Exam General appearance:obese ;well nourished; no acute distress or increased work of breathing is present.  No  lymphadenopathy about the head, neck, or axilla noted.   Eyes: No conjunctival inflammation or lid edema is present. There is no scleral icterus.  Ears:  External ear exam shows no significant lesions or deformities.  Otoscopic examination reveals clear canals, tympanic membranes are intact bilaterally without bulging, retraction, inflammation or discharge.  Nose:  External nasal examination shows no deformity or inflammation. Nasal mucosa are pink and moist without lesions or exudates. No septal dislocation or deviation.No obstruction to airflow.   Oral exam: Dental hygiene is good; lips and gums are healthy appearing.There is no oropharyngeal erythema or exudate noted.   Neck:  No deformities,  masses, or tenderness noted.    Heart:  Normal rate and regular rhythm. S1 and S2 normal without gallop, murmur, click, rub or other extra sounds.   Lungs:Chest clear to auscultation; no wheezes, rhonchi,rales ,or rubs present.No increased work of breathing.    Extremities:  No cyanosis, edema, or clubbing  noted    Skin:  Warm & dry        Assessment & Plan:  #1 acute bronchitis  See orders

## 2013-09-23 NOTE — Progress Notes (Signed)
Pre visit review using our clinic review tool, if applicable. No additional management support is needed unless otherwise documented below in the visit note. 

## 2013-09-23 NOTE — Patient Instructions (Signed)
Carry room temperature water and sip liberally after coughing. Plain Mucinex (NOT D) for thick secretions ;force NON dairy fluids .   Plain Allegra (NOT D )  160 daily , Loratidine 10 mg , OR Zyrtec 10 mg @ bedtime  as needed for itchy eyes & sneezing.

## 2013-10-04 ENCOUNTER — Ambulatory Visit
Admission: RE | Admit: 2013-10-04 | Discharge: 2013-10-04 | Disposition: A | Discharge status: Home / Self Care | Payer: BC Managed Care – PPO | Source: Ambulatory Visit | Attending: Obstetrics & Gynecology | Admitting: Obstetrics & Gynecology

## 2013-10-04 DIAGNOSIS — N644 Mastodynia: Secondary | ICD-10-CM

## 2013-10-04 IMAGING — MG MM DIGITAL DIAGNOSTIC BILAT
6 series · 6 of 6 positions shown · non-contrast
Comparison: With priors

CLINICAL DATA: The patient recently had an episode of right breast
pain and burning for 2 days. The pain has resolved. The patient
denies any palpable lump.

EXAM:
DIGITAL DIAGNOSTIC  BILATERAL MAMMOGRAM WITH CAD

[R CC]
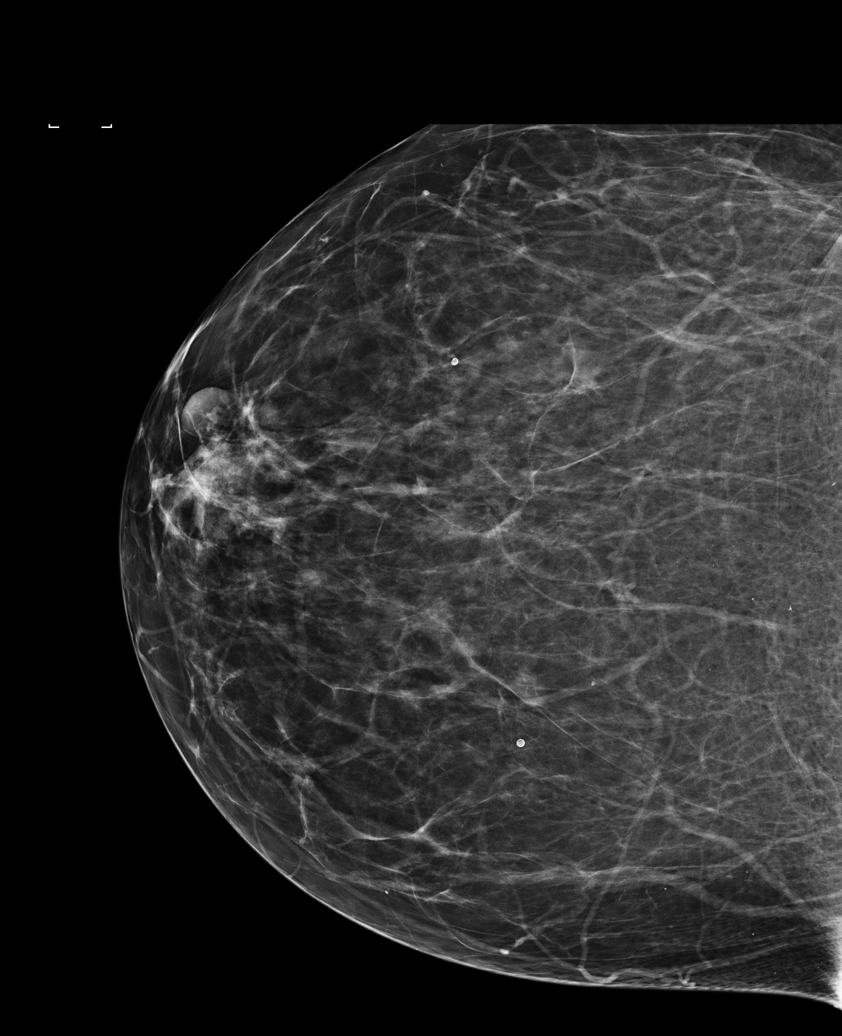

[R MLO (1 of 2)]
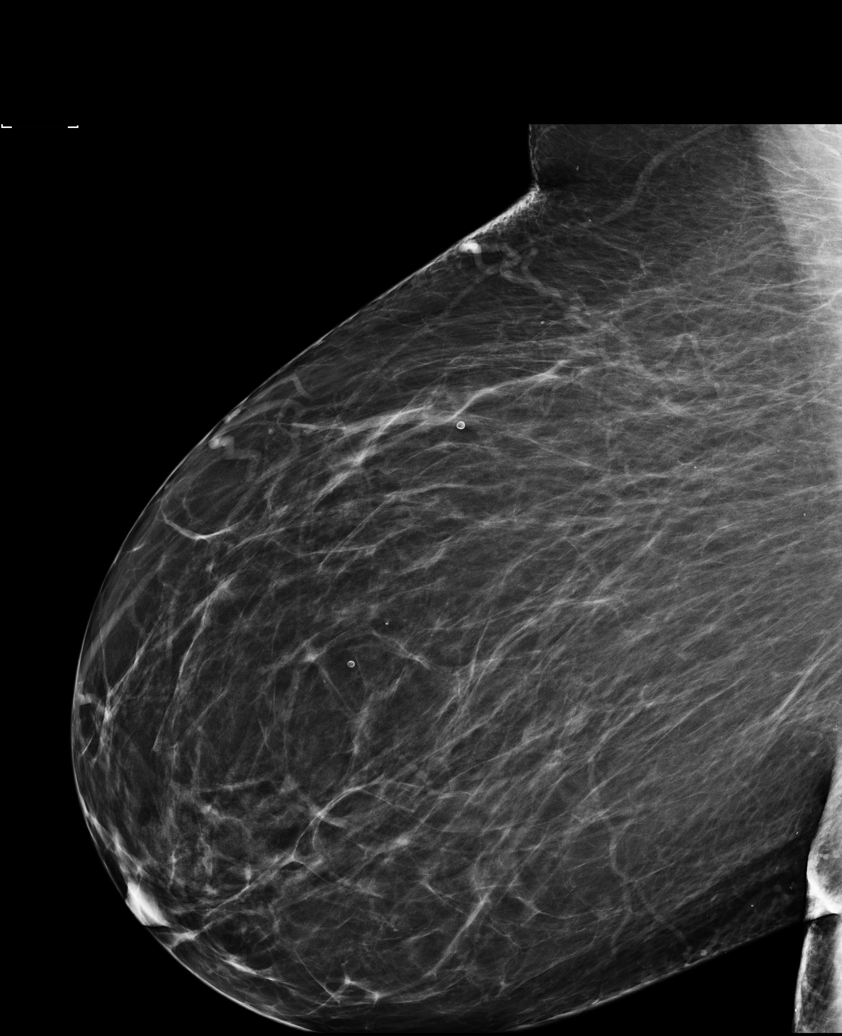

[L MLO]
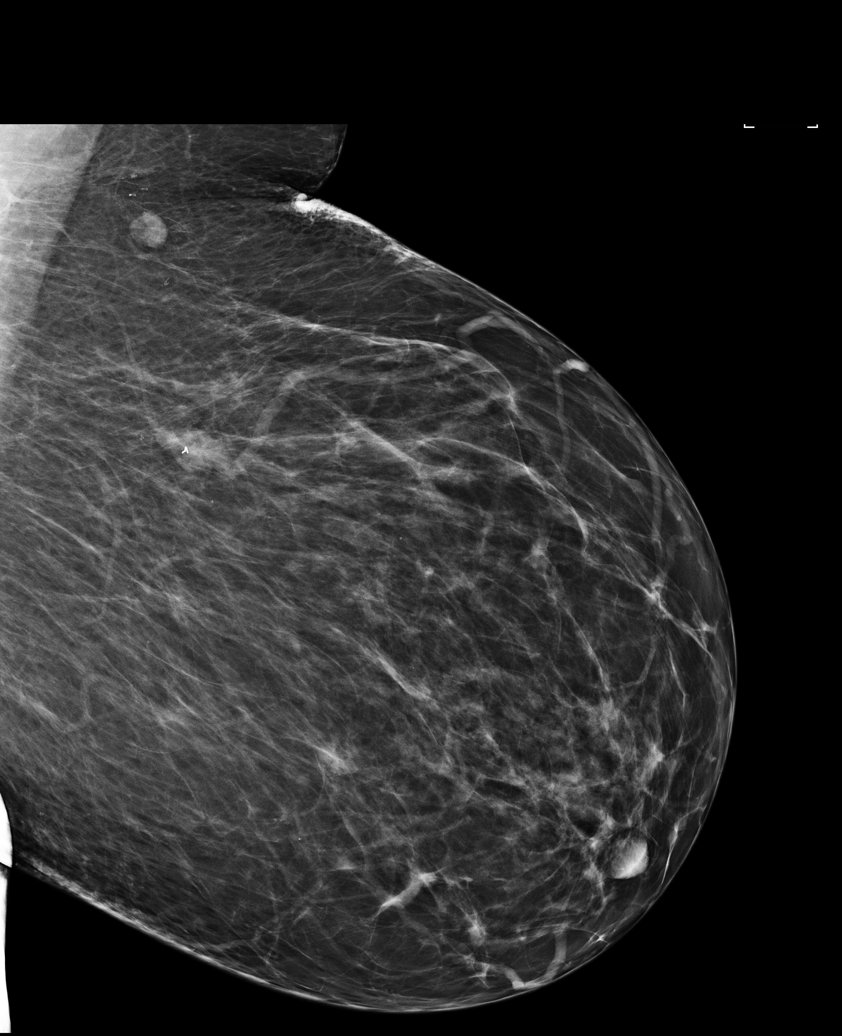

[L CC]
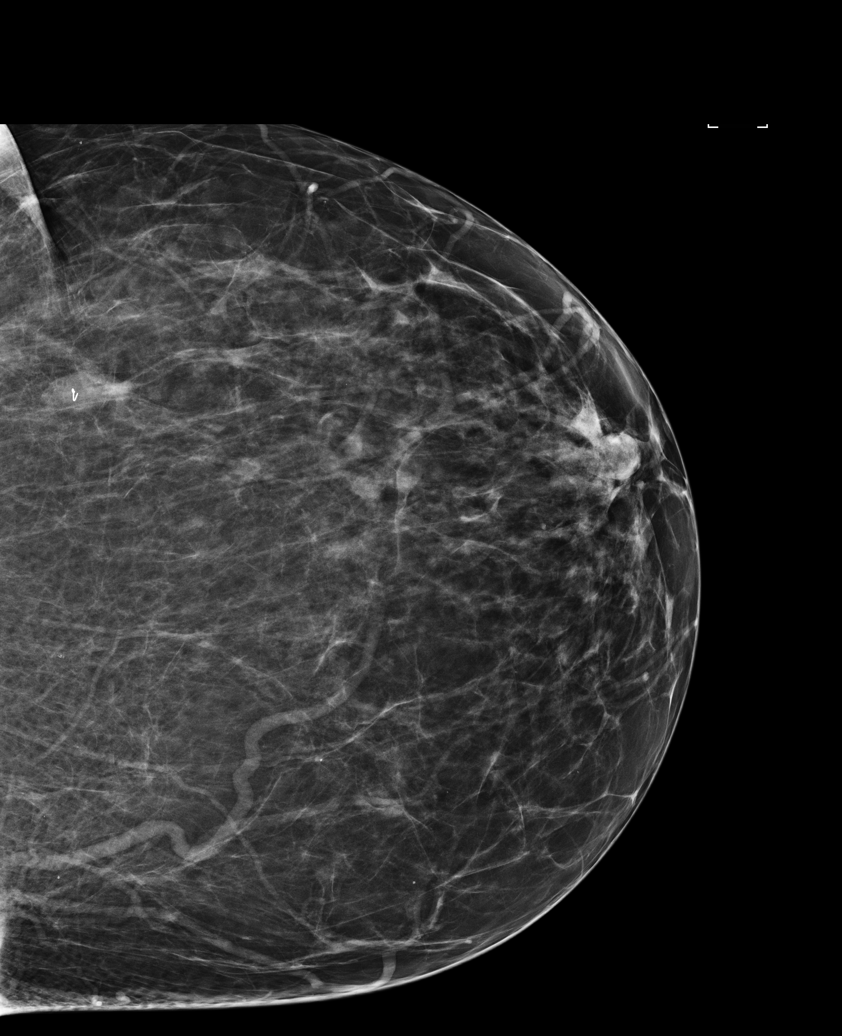

[L XCCL]
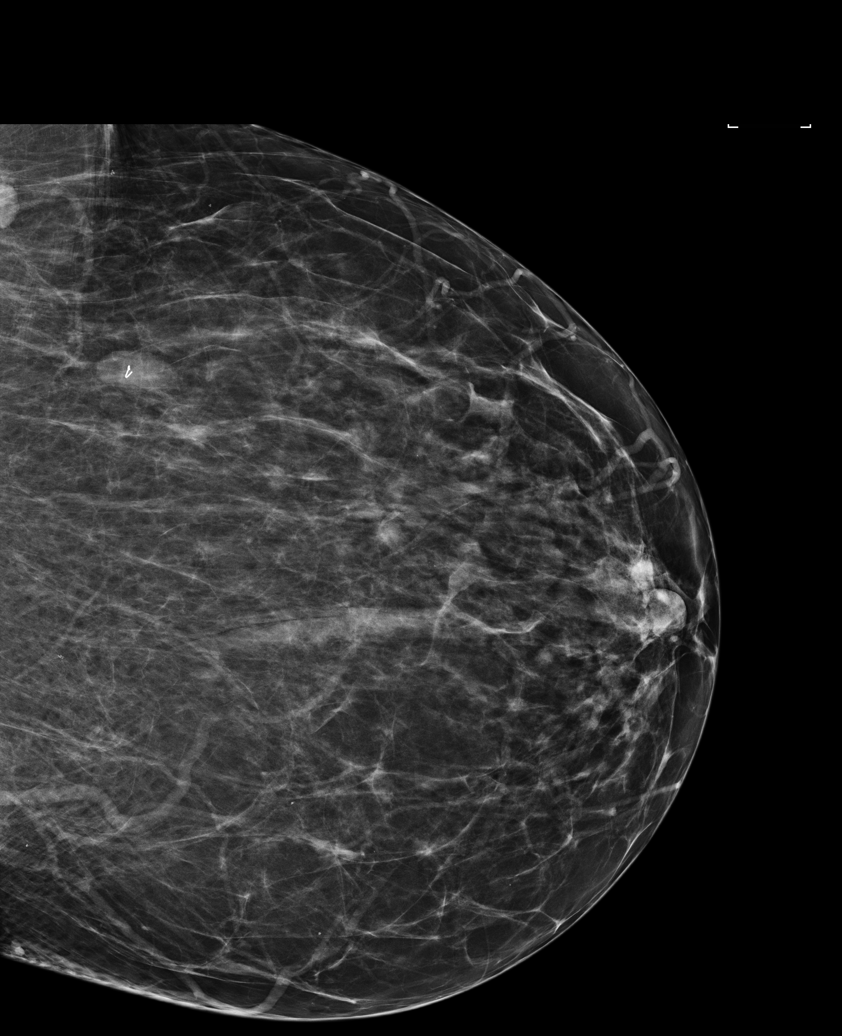

[R MLO (2 of 2)]
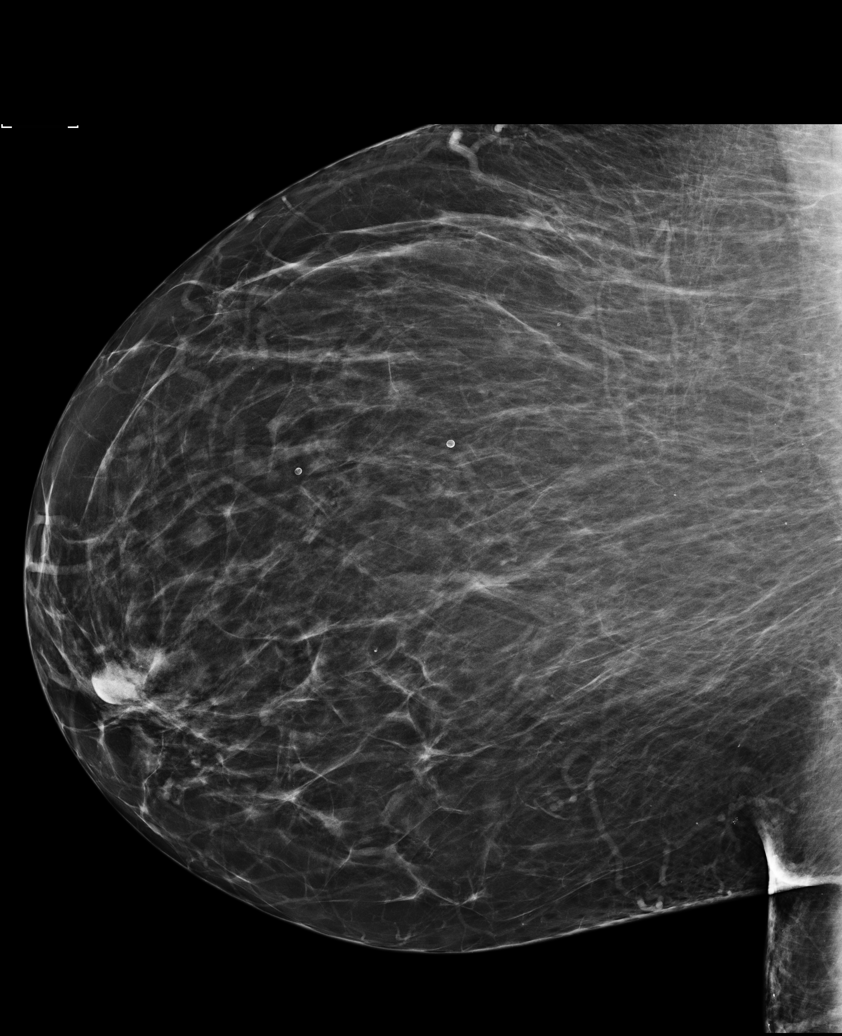

[6 of 6 positions shown; findings below may reference images not displayed]

ACR Breast Density Category b: There are scattered areas of
fibroglandular density.
FINDINGS: Biopsy clip is noted within a stable mass in the upper-outer left
breast. No suspicious mass, suspicious microcalcification, or
distortion is identified in either breast to suggest malignancy.

Mammographic images were processed with CAD.
IMPRESSION: No evidence of malignancy in either breast.

RECOMMENDATION:
Screening mammogram in one year.(Code:[25])

I have discussed the findings and recommendations with the patient.
Results were also provided in writing at the conclusion of the
visit. If applicable, a reminder letter will be sent to the patient
regarding the next appointment.

BI-RADS CATEGORY  1: Negative

## 2014-01-10 ENCOUNTER — Other Ambulatory Visit: Payer: Self-pay | Admitting: Internal Medicine

## 2014-04-21 ENCOUNTER — Telehealth: Payer: Self-pay | Admitting: Internal Medicine

## 2014-04-21 NOTE — Telephone Encounter (Signed)
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 Advise pt that it is not [place] access then it might be [place] but I am going to confirm with [place] Cerise and call her back. Please advise.

## 2014-05-19 ENCOUNTER — Other Ambulatory Visit: Payer: Self-pay | Admitting: Internal Medicine

## 2014-05-20 ENCOUNTER — Emergency Department (INDEPENDENT_AMBULATORY_CARE_PROVIDER_SITE_OTHER)
Admission: EM | Admit: 2014-05-20 | Discharge: 2014-05-20 | Disposition: A | Discharge status: Home / Self Care | Payer: Medicaid Other | Source: Home / Self Care | Attending: Family Medicine | Admitting: Family Medicine

## 2014-05-20 ENCOUNTER — Encounter (HOSPITAL_COMMUNITY): Payer: Self-pay | Admitting: Emergency Medicine

## 2014-05-20 DIAGNOSIS — M79609 Pain in unspecified limb: Secondary | ICD-10-CM

## 2014-05-20 DIAGNOSIS — M79604 Pain in right leg: Secondary | ICD-10-CM

## 2014-05-20 DIAGNOSIS — M79605 Pain in left leg: Secondary | ICD-10-CM

## 2014-05-20 DIAGNOSIS — M79669 Pain in unspecified lower leg: Secondary | ICD-10-CM

## 2014-05-20 DIAGNOSIS — R208 Other disturbances of skin sensation: Secondary | ICD-10-CM

## 2014-05-20 DIAGNOSIS — I739 Peripheral vascular disease, unspecified: Secondary | ICD-10-CM

## 2014-05-20 DIAGNOSIS — R209 Unspecified disturbances of skin sensation: Secondary | ICD-10-CM

## 2014-05-20 NOTE — Discharge Instructions (Signed)
Peripheral Vascular Disease Peripheral Vascular Disease (PVD), also called Peripheral Arterial Disease (PAD), is a circulation problem caused by cholesterol (atherosclerotic plaque) deposits in the arteries. PVD commonly occurs in the lower extremities (legs) but it can occur in other areas of the body, such as your arms. The cholesterol buildup in the arteries reduces blood flow which can cause pain and other serious problems. The presence of PVD can place a person at risk for Coronary Artery Disease (CAD).  CAUSES  Causes of PVD can be many. It is usually associated with more than one risk factor such as:   High Cholesterol.  Smoking.  Diabetes.  Lack of exercise or inactivity.  High blood pressure (hypertension).  Obesity.  Family history. SYMPTOMS   When the lower extremities are affected, patients with PVD may experience:  Leg pain with exertion or physical activity. This is called INTERMITTENT CLAUDICATION. This may present as cramping or numbness with physical activity. The location of the pain is associated with the level of blockage. For example, blockage at the abdominal level (distal abdominal aorta) may result in buttock or hip pain. Lower leg arterial blockage may result in calf pain.  As PVD becomes more severe, pain can develop with less physical activity.  In people with severe PVD, leg pain may occur at rest.  Other PVD signs and symptoms:  Leg numbness or weakness.  Coldness in the affected leg or foot, especially when compared to the other leg.  A change in leg color.  Patients with significant PVD are more prone to ulcers or sores on toes, feet or legs. These may take longer to heal or may reoccur. The ulcers or sores can become infected.  If signs and symptoms of PVD are ignored, gangrene may occur. This can result in the loss of toes or loss of an entire limb.  Not all leg pain is related to PVD. Other medical conditions can cause leg pain such  as:  Blood clots (embolism) or Deep Vein Thrombosis.  Inflammation of the blood vessels (vasculitis).  Spinal stenosis. DIAGNOSIS  Diagnosis of PVD can involve several different types of tests. These can include:  Pulse Volume Recording Method (PVR). This test is simple, painless and does not involve the use of X-rays. PVR involves measuring and comparing the blood pressure in the arms and legs. An ABI (Ankle-Brachial Index) is calculated. The normal ratio of blood pressures is 1. As this number becomes smaller, it indicates more severe disease.  < 0.95 - indicates significant narrowing in one or more leg vessels.  <0.8 - there will usually be pain in the foot, leg or buttock with exercise.  <0.4 - will usually have pain in the legs at rest.  <0.25 - usually indicates limb threatening PVD.  Doppler detection of pulses in the legs. This test is painless and checks to see if you have a pulses in your legs/feet.  A dye or contrast material (a substance that highlights the blood vessels so they show up on x-ray) may be given to help your caregiver better see the arteries for the following tests. The dye is eliminated from your body by the kidney's. Your caregiver may order blood work to check your kidney function and other laboratory values before the following tests are performed:  Magnetic Resonance Angiography (MRA). An MRA is a picture study of the blood vessels and arteries. The MRA machine uses a large magnet to produce images of the blood vessels.  Computed Tomography Angiography (CTA). A CTA  is a specialized x-ray that looks at how the blood flows in your blood vessels. An IV may be inserted into your arm so contrast dye can be injected.  Angiogram. Is a procedure that uses x-rays to look at your blood vessels. This procedure is minimally invasive, meaning a small incision (cut) is made in your groin. A small tube (catheter) is then inserted into the artery of your groin. The catheter  is guided to the blood vessel or artery your caregiver wants to examine. Contrast dye is injected into the catheter. X-rays are then taken of the blood vessel or artery. After the images are obtained, the catheter is taken out. TREATMENT  Treatment of PVD involves many interventions which may include:  Lifestyle changes:  Quitting smoking.  Exercise.  Following a low fat, low cholesterol diet.  Control of diabetes.  Foot care is very important to the PVD patient. Good foot care can help prevent infection.  Medication:  Cholesterol-lowering medicine.  Blood pressure medicine.  Anti-platelet drugs.  Certain medicines may reduce symptoms of Intermittent Claudication.  Interventional/Surgical options:  Angioplasty. An Angioplasty is a procedure that inflates a balloon in the blocked artery. This opens the blocked artery to improve blood flow.  Stent Implant. A wire mesh tube (stent) is placed in the artery. The stent expands and stays in place, allowing the artery to remain open.  Peripheral Bypass Surgery. This is a surgical procedure that reroutes the blood around a blocked artery to help improve blood flow. This type of procedure may be performed if Angioplasty or stent implants are not an option. SEEK IMMEDIATE MEDICAL CARE IF:   You develop pain or numbness in your arms or legs.  Your arm or leg turns cold, becomes blue in color.  You develop redness, warmth, swelling and pain in your arms or legs. MAKE SURE YOU:   Understand these instructions.  Will watch your condition.  Will get help right away if you are not doing well or get worse. Document Released: 07/04/2004 Document Revised: 08/19/2011 Document Reviewed: 05/31/2008 [facility] Patient Information 2015 [place], [place]. This information is not intended to replace advice given to you by your health care provider. Make sure you discuss any questions you have with your health care provider.  Musculoskeletal  Pain Musculoskeletal pain is muscle and boney aches and pains. These pains can occur in any part of the body. Your caregiver may treat you without knowing the cause of the pain. They may treat you if blood or urine tests, X-rays, and other tests were normal.  CAUSES There is often not a definite cause or reason for these pains. These pains may be caused by a type of germ (virus). The discomfort may also come from overuse. Overuse includes working out too hard when your body is not fit. Boney aches also come from weather changes. Bone is sensitive to atmospheric pressure changes. HOME CARE INSTRUCTIONS   Ask when your test results will be ready. Make sure you get your test results.  Only take over-the-counter or prescription medicines for pain, discomfort, or fever as directed by your caregiver. If you were given medications for your condition, do not drive, operate machinery or power tools, or sign legal documents for 24 hours. Do not drink alcohol. Do not take sleeping pills or other medications that may interfere with treatment.  Continue all activities unless the activities cause more pain. When the pain lessens, slowly resume normal activities. Gradually increase the intensity and duration of the activities or  exercise.  During periods of severe pain, bed rest may be helpful. Lay or sit in any position that is comfortable.  Putting ice on the injured area.  Put ice in a bag.  Place a towel between your skin and the bag.  Leave the ice on for 15 to 20 minutes, 3 to 4 times a day.  Follow up with your caregiver for continued problems and no reason can be found for the pain. If the pain becomes worse or does not go away, it may be necessary to repeat tests or do additional testing. Your caregiver may need to look further for a possible cause. SEEK IMMEDIATE MEDICAL CARE IF:  You have pain that is getting worse and is not relieved by medications.  You develop chest pain that is associated  with shortness or breath, sweating, feeling sick to your stomach (nauseous), or throw up (vomit).  Your pain becomes localized to the abdomen.  You develop any new symptoms that seem different or that concern you. MAKE SURE YOU:   Understand these instructions.  Will watch your condition.  Will get help right away if you are not doing well or get worse. Document Released: 05/27/2005 Document Revised: 08/19/2011 Document Reviewed: 01/29/2013 [facility] Patient Information 2015 [place], [place]. This information is not intended to replace advice given to you by your health care provider. Make sure you discuss any questions you have with your health care provider.

## 2014-05-20 NOTE — ED Notes (Signed)
Pt states she has had pain in her left lower leg for "a while".  She could not clarify how long "a while" is.  The pain is worse with activity.  She states it hurts more to touch it.  She also states she has some pain, but not as bad in her right lower leg.  She also reports pain in her fingers from time to time.

## 2014-05-20 NOTE — ED Provider Notes (Signed)
CSN: [id number]     Arrival date & time 05/20/14  1306 History   First MD Initiated Contact with Patient 05/20/14 1423     Chief Complaint  Patient presents with  . Leg Pain    mainly left leg, but some pain in the right  . Hand Pain   (Consider location/radiation/quality/duration/timing/severity/associated sxs/prior Treatment) HPI Comments: C/O bilateral leg swelling and pain for about 1 month. No known injuries. Worse with activity. No rash or burning. PMH reveals chronic venous insufficiency and stasis dermatitis. Pt denied any known diagnosis associated with her legs.   SOmetimes her hands get cold in past 3 days. Has been using the computer frequently recently for school.    Past Medical History  Diagnosis Date  . Obesity   . Depression     treated with zoloft in past  . Hyperlipemia     no treatment required  . OA (osteoarthritis) of knee     bilaterally   . Allergic rhinitis   . GERD (gastroesophageal reflux disease)   . Lactose intolerance    History reviewed. No pertinent past surgical history. Family History  Problem Relation Age of Onset  . Cancer Father     throat  . Diabetes Other   . Cholelithiasis Other    History  Substance Use Topics  . Smoking status: Former Research scientist (life sciences)  . Smokeless tobacco: Not on file  . Alcohol Use: No   OB History    No data available     Review of Systems  Constitutional: Negative for fever, activity change and appetite change.  HENT: Negative.   Respiratory: Negative.   Gastrointestinal: Negative.   Genitourinary: Negative.   Musculoskeletal: Positive for myalgias. Negative for joint swelling and neck pain.  Skin: Negative for pallor and rash.  Neurological: Negative.     Allergies  Penicillins  Home Medications   Prior to Admission medications   Medication Sig Start Date End Date Taking? Authorizing Provider  cyanocobalamin (,VITAMIN B-12,) 1000 MCG/ML injection INJECT 1 ML INTO THE MUSCLE EVERY 30 DAYS 01/10/14  Yes  Aleksei Plotnikov V, MD  ergocalciferol (VITAMIN D2) 50000 UNITS capsule TAKE ONE CAPSULE BY MOUTH ONCE EVERY 3O DAYS 05/19/14  Yes Aleksei Plotnikov V, MD  pantoprazole (PROTONIX) 40 MG tablet TAKE 1 TABLET BY MOUTH EVERY DAY 05/19/14  Yes Aleksei Plotnikov V, MD  azithromycin (ZITHROMAX Z-PAK) 250 MG tablet 2 day 1, then 1 qd 09/23/13   Hendricks Limes, MD  Cholecalciferol 1000 UNITS tablet Take 1 tablet (1,000 Units total) by mouth daily. 11/06/12   Aleksei Plotnikov V, MD  clonazePAM (KLONOPIN) 0.5 MG tablet Take 1 tablet (0.5 mg total) by mouth 2 (two) times daily as needed for anxiety. 08/13/13   Aleksei Plotnikov V, MD  fexofenadine-pseudoephedrine (ALLEGRA-D 24 HOUR) 180-240 MG per 24 hr tablet Take 1 tablet by mouth daily. 11/06/12   Aleksei Plotnikov V, MD  HYDROcodone-acetaminophen (NORCO/VICODIN) 5-325 MG per tablet Take 1 tablet by mouth 2 (two) times daily as needed. 08/13/13   Aleksei Plotnikov V, MD  HYDROcodone-homatropine (HYDROMET) 5-1.5 MG/5ML syrup Take 5 mLs by mouth every 6 (six) hours as needed for cough. 09/23/13   Hendricks Limes, MD  nabumetone (RELAFEN) 500 MG tablet Take 1 tablet (500 mg total) by mouth 2 (two) times daily as needed. 08/13/13   Aleksei Plotnikov V, MD  triamcinolone cream (KENALOG) 0.5 % Apply topically 3 (three) times daily. rash 08/13/13   Aleksei Plotnikov V, MD   BP 142/84 mmHg  Pulse 80  Temp(Src) 99.3 F (37.4 C) (Oral)  Resp 16  SpO2 100%  LMP 04/27/2014 (Approximate) Physical Exam  Constitutional: She is oriented to person, place, and time. She appears well-developed and well-nourished. No distress.  Severely morbidly obese  Neck: Normal range of motion. Neck supple.  Pulmonary/Chest: Effort normal. No respiratory distress.  Musculoskeletal: Normal range of motion.  No definitive edema to the lower extremities. Tenderness to most areas but greatest posterior knee at the proximal end of the gastrocnemius muscles. No erythema. No cyanosis or rash.  Few small superficial varicosities.  Extension and flexion nl, at her baseline. Distal N/V M/AS intact. Pedal pulses 2 +  Bilat hand exam nl. No swelling, discoloration, joint tenderness, lesions, bony or soft tissue tenderness. Full flexion of all hand and wrist joints with good tone and strength. Brisk cap refill, nl warmth and color. Radial pulses 2+.   Neurological: She is alert and oriented to person, place, and time. She exhibits normal muscle tone.  Skin: Skin is warm and dry. No rash noted.  Nursing note and vitals reviewed.   ED Course  Procedures (including critical care time) Labs Review Labs Reviewed - No data to display  Imaging Review No results found.   MDM   1. Leg pain, bilateral   2. Calf tenderness   3. Cold hands   4. Peripheral vascular disease   5. Morbid obesity    No signs of acute circulatory problems in upper or lower extremities No evidence of DVT Compression stockings NSAIDS for pain prn See your PCP soon, call for appt.    Janne Napoleon, NP 05/20/14 [phone]

## 2014-05-23 NOTE — Telephone Encounter (Signed)
Okay only if not [place] [facility].

## 2014-06-16 ENCOUNTER — Other Ambulatory Visit: Payer: Self-pay | Admitting: Internal Medicine

## 2014-07-19 ENCOUNTER — Other Ambulatory Visit: Payer: Self-pay | Admitting: Internal Medicine

## 2014-09-16 ENCOUNTER — Other Ambulatory Visit: Payer: Self-pay | Admitting: Obstetrics & Gynecology

## 2014-09-16 DIAGNOSIS — Z1231 Encounter for screening mammogram for malignant neoplasm of breast: Secondary | ICD-10-CM

## 2014-09-28 ENCOUNTER — Encounter: Payer: Self-pay | Admitting: Internal Medicine

## 2014-09-28 ENCOUNTER — Ambulatory Visit (INDEPENDENT_AMBULATORY_CARE_PROVIDER_SITE_OTHER): Payer: BLUE CROSS/BLUE SHIELD | Admitting: Internal Medicine

## 2014-09-28 ENCOUNTER — Other Ambulatory Visit (INDEPENDENT_AMBULATORY_CARE_PROVIDER_SITE_OTHER): Payer: BLUE CROSS/BLUE SHIELD

## 2014-09-28 VITALS — BP 140/84 | HR 80 | Ht 64.0 in | Wt 335.0 lb

## 2014-09-28 DIAGNOSIS — D500 Iron deficiency anemia secondary to blood loss (chronic): Secondary | ICD-10-CM | POA: Diagnosis not present

## 2014-09-28 DIAGNOSIS — E538 Deficiency of other specified B group vitamins: Secondary | ICD-10-CM | POA: Diagnosis not present

## 2014-09-28 DIAGNOSIS — E559 Vitamin D deficiency, unspecified: Secondary | ICD-10-CM | POA: Diagnosis not present

## 2014-09-28 DIAGNOSIS — E669 Obesity, unspecified: Secondary | ICD-10-CM | POA: Diagnosis not present

## 2014-09-28 DIAGNOSIS — Z0000 Encounter for general adult medical examination without abnormal findings: Secondary | ICD-10-CM

## 2014-09-28 DIAGNOSIS — E785 Hyperlipidemia, unspecified: Secondary | ICD-10-CM | POA: Diagnosis not present

## 2014-09-28 DIAGNOSIS — N3090 Cystitis, unspecified without hematuria: Secondary | ICD-10-CM

## 2014-09-28 LAB — HEMOGLOBIN A1C: Hgb A1c MFr Bld: 5.4 % (ref 4.6–6.5)

## 2014-09-28 LAB — HEPATIC FUNCTION PANEL
ALT: 9 U/L (ref 0–35)
AST: 11 U/L (ref 0–37)
Albumin: 3.9 g/dL (ref 3.5–5.2)
Alkaline Phosphatase: 76 U/L (ref 39–117)
BILIRUBIN TOTAL: 0.9 mg/dL (ref 0.2–1.2)
Bilirubin, Direct: 0.1 mg/dL (ref 0.0–0.3)
Total Protein: 7.3 g/dL (ref 6.0–8.3)

## 2014-09-28 LAB — URINALYSIS, ROUTINE W REFLEX MICROSCOPIC
Bilirubin Urine: NEGATIVE
Ketones, ur: NEGATIVE
NITRITE: NEGATIVE
PH: 6 (ref 5.0–8.0)
SPECIFIC GRAVITY, URINE: 1.025 (ref 1.000–1.030)
Total Protein, Urine: NEGATIVE
Urine Glucose: NEGATIVE
Urobilinogen, UA: 0.2 (ref 0.0–1.0)

## 2014-09-28 LAB — CBC WITH DIFFERENTIAL/PLATELET
BASOS ABS: 0.1 10*3/uL (ref 0.0–0.1)
BASOS PCT: 0.8 % (ref 0.0–3.0)
EOS PCT: 1.3 % (ref 0.0–5.0)
Eosinophils Absolute: 0.1 10*3/uL (ref 0.0–0.7)
HCT: 37.6 % (ref 36.0–46.0)
HEMOGLOBIN: 12.6 g/dL (ref 12.0–15.0)
Lymphocytes Relative: 36.2 % (ref 12.0–46.0)
Lymphs Abs: 2.6 10*3/uL (ref 0.7–4.0)
MCHC: 33.6 g/dL (ref 30.0–36.0)
MCV: 87.1 fl (ref 78.0–100.0)
MONO ABS: 0.4 10*3/uL (ref 0.1–1.0)
Monocytes Relative: 5 % (ref 3.0–12.0)
NEUTROS ABS: 4.1 10*3/uL (ref 1.4–7.7)
Neutrophils Relative %: 56.7 % (ref 43.0–77.0)
Platelets: 250 10*3/uL (ref 150.0–400.0)
RBC: 4.32 Mil/uL (ref 3.87–5.11)
RDW: 14.7 % (ref 11.5–15.5)
WBC: 7.2 10*3/uL (ref 4.0–10.5)

## 2014-09-28 LAB — BASIC METABOLIC PANEL
BUN: 10 mg/dL (ref 6–23)
CO2: 24 mEq/L (ref 19–32)
CREATININE: 0.65 mg/dL (ref 0.40–1.20)
Calcium: 8.9 mg/dL (ref 8.4–10.5)
Chloride: 106 mEq/L (ref 96–112)
GFR: 124.55 mL/min (ref >60.00)
Glucose, Bld: 88 mg/dL (ref 70–99)
Potassium: 3.7 mEq/L (ref 3.5–5.1)
Sodium: 137 mEq/L (ref 135–145)

## 2014-09-28 LAB — VITAMIN D 25 HYDROXY (VIT D DEFICIENCY, FRACTURES): VITD: 18.82 ng/mL — ABNORMAL LOW (ref 30.00–100.00)

## 2014-09-28 LAB — LIPID PANEL
CHOLESTEROL: 194 mg/dL (ref 0–200)
HDL: 68.8 mg/dL (ref >39.00)
LDL CALC: 109 mg/dL — AB (ref 0–99)
NonHDL: 125.2
TRIGLYCERIDES: 79 mg/dL (ref 0.0–149.0)
Total CHOL/HDL Ratio: 3
VLDL: 15.8 mg/dL (ref 0.0–40.0)

## 2014-09-28 LAB — VITAMIN B12: Vitamin B-12: 809 pg/mL (ref 211–911)

## 2014-09-28 LAB — IBC PANEL
Iron: 67 ug/dL (ref 42–145)
SATURATION RATIOS: 18.4 % — AB (ref 20.0–50.0)
TRANSFERRIN: 260 mg/dL (ref 212.0–360.0)

## 2014-09-28 LAB — TSH: TSH: 2.91 u[IU]/mL (ref 0.35–4.50)

## 2014-09-28 MED ORDER — CLONAZEPAM 0.5 MG PO TABS: 0.5000 mg | ORAL_TABLET | Freq: Two times a day (BID) | ORAL | Status: DC | PRN

## 2014-09-28 MED ORDER — FEXOFENADINE-PSEUDOEPHED ER 180-240 MG PO TB24: 1.0000 | ORAL_TABLET | Freq: Every day | ORAL | Status: DC | PRN

## 2014-09-28 MED ORDER — TRIAMCINOLONE ACETONIDE 0.5 % EX CREA: TOPICAL_CREAM | Freq: Three times a day (TID) | CUTANEOUS | Status: DC

## 2014-09-28 MED ORDER — CYANOCOBALAMIN 1000 MCG/ML IJ SOLN: INTRAMUSCULAR | Status: DC

## 2014-09-28 MED ORDER — LORCASERIN HCL 10 MG PO TABS: 1.0000 | ORAL_TABLET | Freq: Two times a day (BID) | ORAL | Status: DC

## 2014-09-28 MED ORDER — PANTOPRAZOLE SODIUM 40 MG PO TBEC: 40.0000 mg | DELAYED_RELEASE_TABLET | Freq: Every day | ORAL | Status: DC

## 2014-09-28 MED ORDER — HYDROCODONE-ACETAMINOPHEN 5-325 MG PO TABS
1.0000 | ORAL_TABLET | Freq: Two times a day (BID) | ORAL | Status: DC | PRN
Start: 2014-09-28 — End: 2015-09-12

## 2014-09-28 NOTE — Assessment & Plan Note (Signed)
On B12 Labs 

## 2014-09-28 NOTE — Progress Notes (Signed)
   Subjective:    HPI  The patient is here for a wellness exam.  Wt Readings from Last 3 Encounters:  09/28/14 335 lb (151.955 kg)  09/23/13 338 lb 12.8 oz (153.679 kg)  08/13/13 336 lb (152.409 kg)   BP Readings from Last 3 Encounters:  09/28/14 140/84  05/20/14 142/84  09/23/13 130/84     .Review of Systems  Constitutional: Positive for unexpected weight change. Negative for fever, chills, diaphoresis, activity change, appetite change and fatigue.  HENT: Negative for congestion, ear pain, facial swelling, hearing loss, mouth sores, nosebleeds, postnasal drip, rhinorrhea, sinus pressure, sneezing, sore throat, tinnitus and trouble swallowing.   Eyes: Negative for pain, discharge, redness, itching and visual disturbance.  Respiratory: Negative for cough, chest tightness, shortness of breath, wheezing and stridor.   Cardiovascular: Negative for chest pain, palpitations and leg swelling.  Gastrointestinal: Negative for nausea, diarrhea, constipation, blood in stool, abdominal distention, anal bleeding and rectal pain.  Genitourinary: Negative for dysuria, urgency, frequency, hematuria, flank pain, vaginal bleeding, vaginal discharge, difficulty urinating, genital sores and pelvic pain.  Musculoskeletal: Positive for back pain. Negative for joint swelling, arthralgias, gait problem, neck pain and neck stiffness.  Skin: Negative.  Negative for rash.  Neurological: Negative for dizziness, tremors, seizures, syncope, speech difficulty, weakness, numbness and headaches.  Hematological: Negative for adenopathy. Does not bruise/bleed easily.  Psychiatric/Behavioral: Negative for suicidal ideas, behavioral problems, sleep disturbance, dysphoric mood and decreased concentration. The patient is nervous/anxious.          Objective:   Physical Exam  Constitutional: She appears well-developed. No distress.  Obese  HENT:  Head: Normocephalic.  Right Ear: External ear normal.  Left Ear:  External ear normal.  Nose: Nose normal.  Mouth/Throat: Oropharynx is clear and moist.  Eyes: Conjunctivae are normal. Pupils are equal, round, and reactive to light. Right eye exhibits no discharge. Left eye exhibits no discharge.  Neck: Normal range of motion. Neck supple. No JVD present. No tracheal deviation present. No thyromegaly present.  Cardiovascular: Normal rate, regular rhythm and normal heart sounds.   Pulmonary/Chest: No stridor. No respiratory distress. She has no wheezes.  Abdominal: Soft. Bowel sounds are normal. She exhibits no distension and no mass. There is no tenderness. There is no rebound and no guarding.  Musculoskeletal: She exhibits tenderness (LS is tender). She exhibits no edema.  Lymphadenopathy:    She has no cervical adenopathy.  Neurological: She displays normal reflexes. No cranial nerve deficit. She exhibits normal muscle tone. Coordination normal.  Skin: No rash noted. No erythema.  Psychiatric: She has a normal mood and affect. Her behavior is normal. Judgment and thought content normal.  LE w/trace edema B Hyperpigmented ankles, mild erythema    Lab Results  Component Value Date   WBC 6.8 10/07/2012   HGB 12.8 10/07/2012   HCT 37.9 10/07/2012   PLT 230.0 10/07/2012   CHOL 178 10/07/2012   TRIG 57.0 10/07/2012   HDL 58.80 10/07/2012   LDLDIRECT 144.0 06/14/2010   ALT 14 10/07/2012   AST 15 10/07/2012   NA 139 10/07/2012   K 3.7 10/07/2012   CL 106 10/07/2012   CREATININE 0.6 10/07/2012   BUN 9 10/07/2012   CO2 29 10/07/2012   TSH 2.09 10/07/2012        Assessment & Plan:

## 2014-09-28 NOTE — Assessment & Plan Note (Signed)
We discussed age appropriate health related issues, including available/recomended screening tests and vaccinations. We discussed a need for adhering to healthy diet and exercise. Labs/EKG were reviewed/ordered. All questions were answered.   

## 2014-09-28 NOTE — Assessment & Plan Note (Signed)
Discussed Will try Belviq

## 2014-09-28 NOTE — Assessment & Plan Note (Signed)
CBC, iron 

## 2014-09-28 NOTE — Assessment & Plan Note (Signed)
Wt loss discussed 

## 2014-09-28 NOTE — Progress Notes (Signed)
Pre visit review using our clinic review tool, if applicable. No additional management support is needed unless otherwise documented below in the visit note. 

## 2014-09-29 DIAGNOSIS — N3090 Cystitis, unspecified without hematuria: Secondary | ICD-10-CM | POA: Insufficient documentation

## 2014-09-29 DIAGNOSIS — E559 Vitamin D deficiency, unspecified: Secondary | ICD-10-CM | POA: Insufficient documentation

## 2014-09-29 MED ORDER — CIPROFLOXACIN HCL 250 MG PO TABS
250.0000 mg | ORAL_TABLET | Freq: Two times a day (BID) | ORAL | Status: DC
Start: 2014-09-29 — End: 2015-09-11

## 2014-09-29 MED ORDER — ERGOCALCIFEROL 1.25 MG (50000 UT) PO CAPS: 50000.0000 [IU] | ORAL_CAPSULE | ORAL | Status: DC

## 2014-09-29 NOTE — Assessment & Plan Note (Signed)
Cipro po °

## 2014-09-29 NOTE — Assessment & Plan Note (Signed)
Vit D Rx °

## 2014-10-12 ENCOUNTER — Ambulatory Visit
Admission: RE | Admit: 2014-10-12 | Discharge: 2014-10-12 | Disposition: A | Discharge status: Home / Self Care | Payer: BLUE CROSS/BLUE SHIELD | Source: Ambulatory Visit | Attending: Obstetrics & Gynecology | Admitting: Obstetrics & Gynecology

## 2014-10-12 DIAGNOSIS — Z1231 Encounter for screening mammogram for malignant neoplasm of breast: Secondary | ICD-10-CM

## 2014-10-12 IMAGING — MG MM SCREEN MAMMOGRAM BILATERAL
7 series · 7 of 7 positions shown · non-contrast
Comparison: Previous exam(s).

CLINICAL DATA: Screening.

EXAM:
DIGITAL SCREENING BILATERAL MAMMOGRAM WITH CAD

[R CC (1 of 3)]
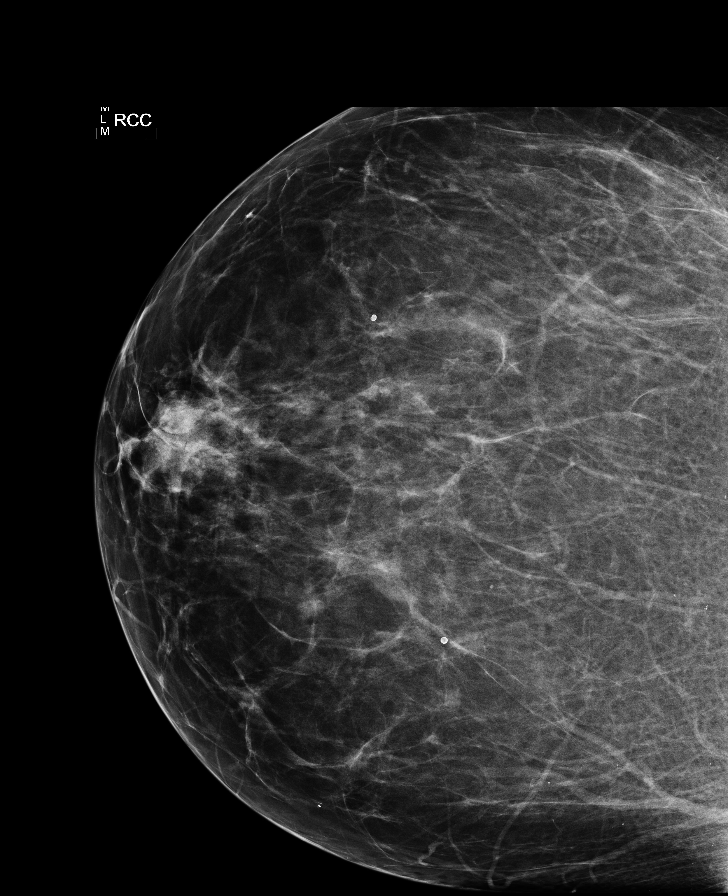

[L CC (1 of 2)]
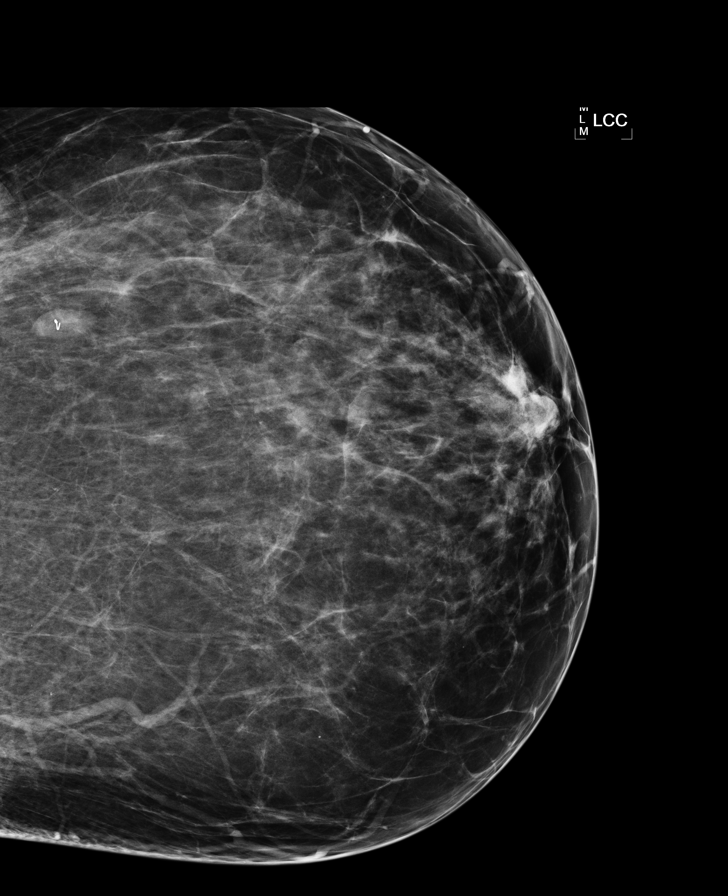

[L MLO]
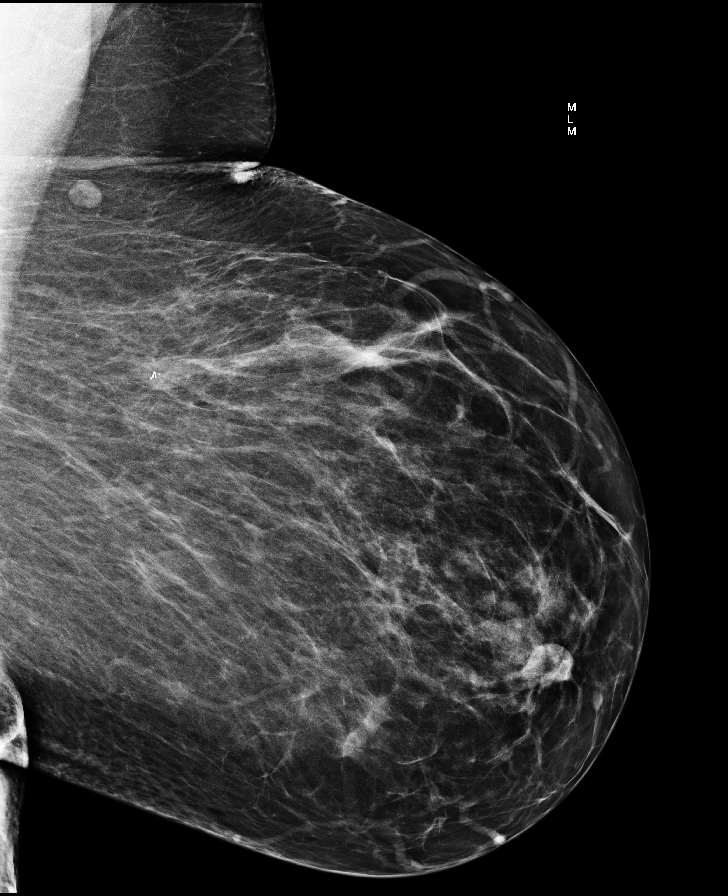

[R MLO]
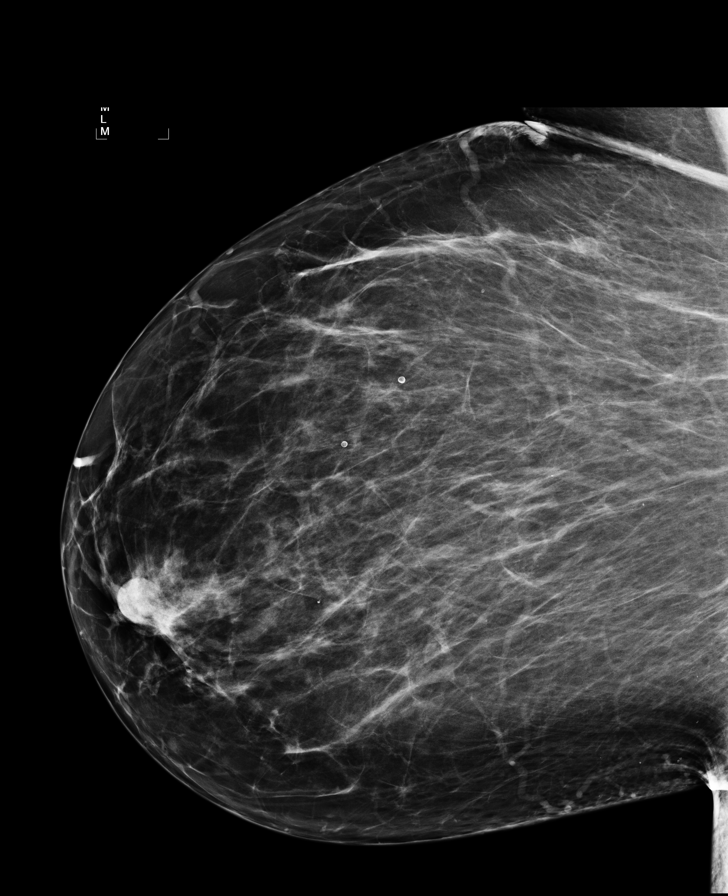

[L CC (2 of 2)]
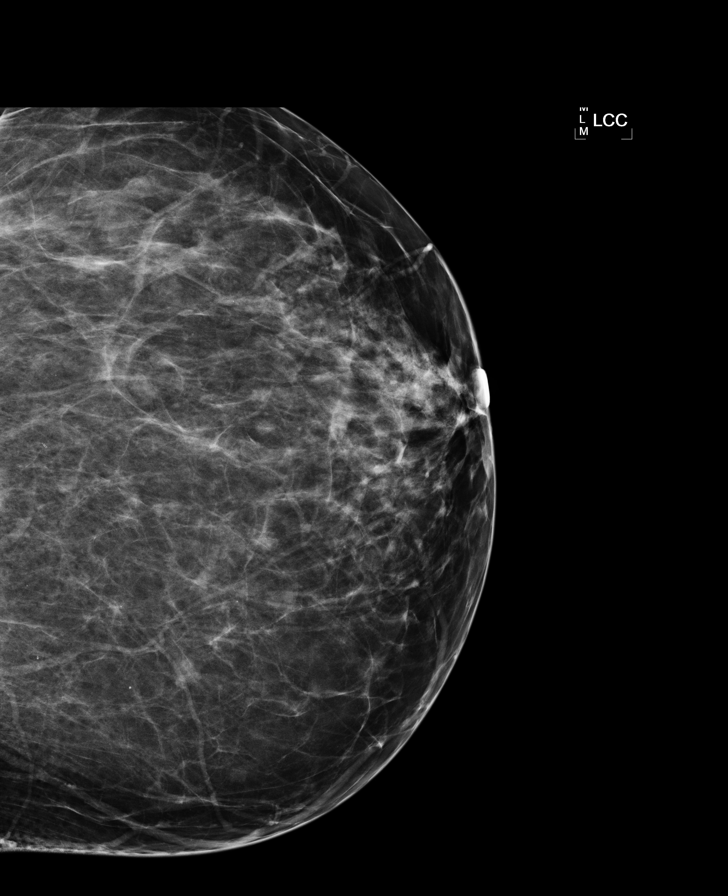

[R CC (2 of 3)]
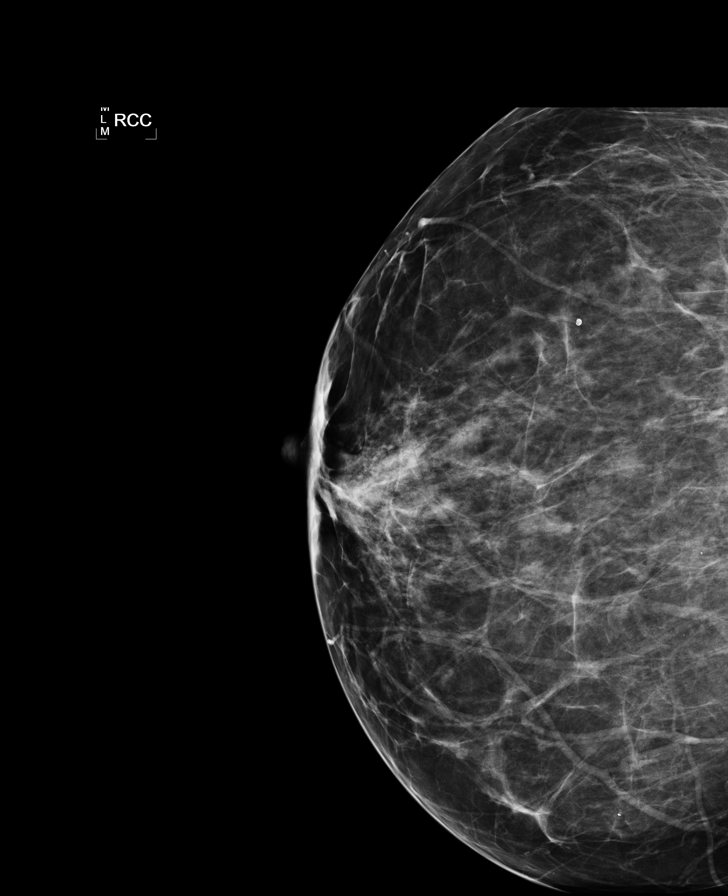

[R CC (3 of 3)]
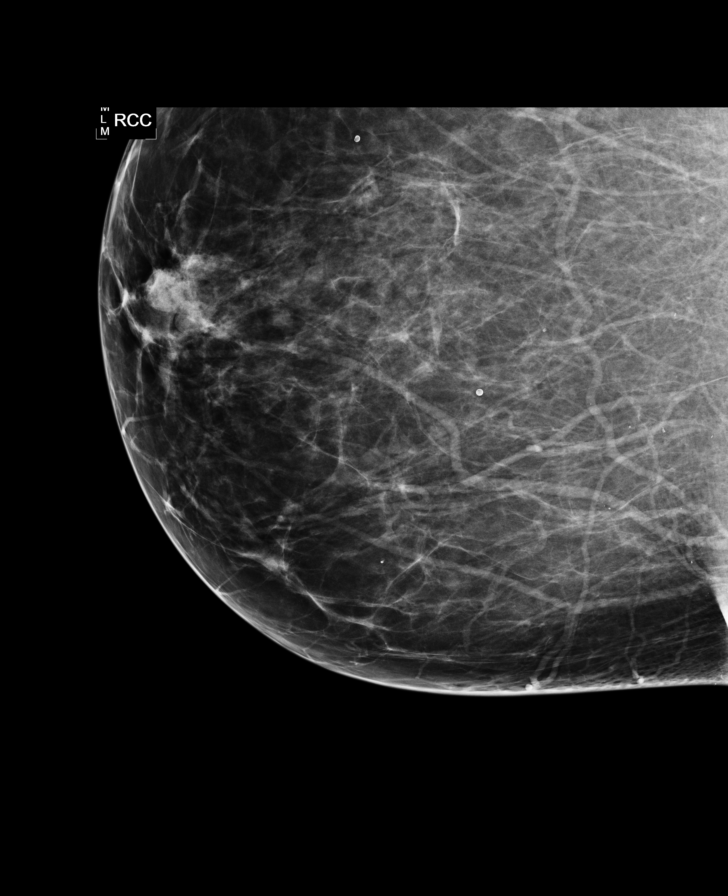

[7 of 7 positions shown; findings below may reference images not displayed]

ACR Breast Density Category b: There are scattered areas of
fibroglandular density.
FINDINGS: There are no findings suspicious for malignancy. Images were
processed with CAD.
IMPRESSION: No mammographic evidence of malignancy. A result letter of this
screening mammogram will be mailed directly to the patient.

RECOMMENDATION:
Screening mammogram in one year. (Code:[US])

BI-RADS CATEGORY  1: Negative.

## 2014-11-29 ENCOUNTER — Other Ambulatory Visit: Payer: Self-pay | Admitting: Internal Medicine

## 2014-11-29 ENCOUNTER — Other Ambulatory Visit: Payer: Self-pay | Admitting: Physician Assistant

## 2014-11-29 DIAGNOSIS — R109 Unspecified abdominal pain: Secondary | ICD-10-CM

## 2014-12-02 ENCOUNTER — Ambulatory Visit
Admission: RE | Admit: 2014-12-02 | Discharge: 2014-12-02 | Disposition: A | Discharge status: Home / Self Care | Payer: Medicaid Other | Source: Ambulatory Visit | Attending: Physician Assistant | Admitting: Physician Assistant

## 2014-12-02 DIAGNOSIS — R109 Unspecified abdominal pain: Secondary | ICD-10-CM

## 2014-12-02 IMAGING — US US ABDOMEN COMPLETE
1 series · 14 of 25 positions shown · non-contrast
Comparison: [DATE]

CLINICAL DATA: Abdominal pain for 2 months without nausea or
vomiting, history hyperlipidemia, GERD, former smoker

EXAM:
ULTRASOUND ABDOMEN COMPLETE

[Series 1: us abdomen complete · 0.37mm/px · 14 of 80 slices shown]
[im 1/80]
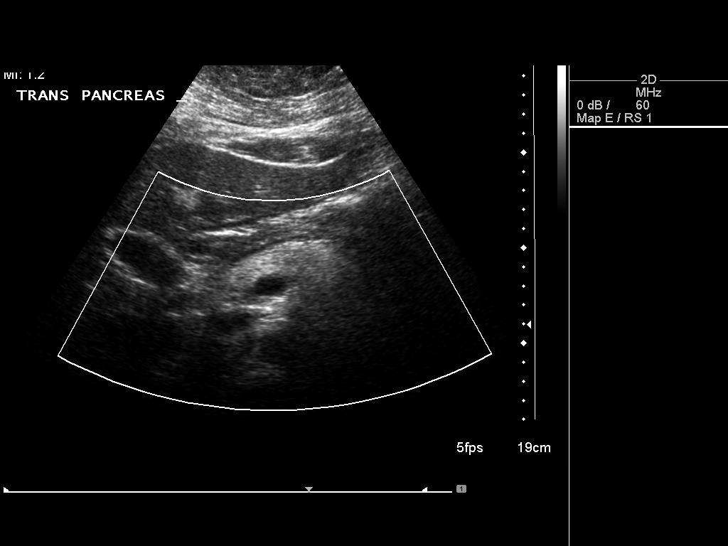
[im 7/80]
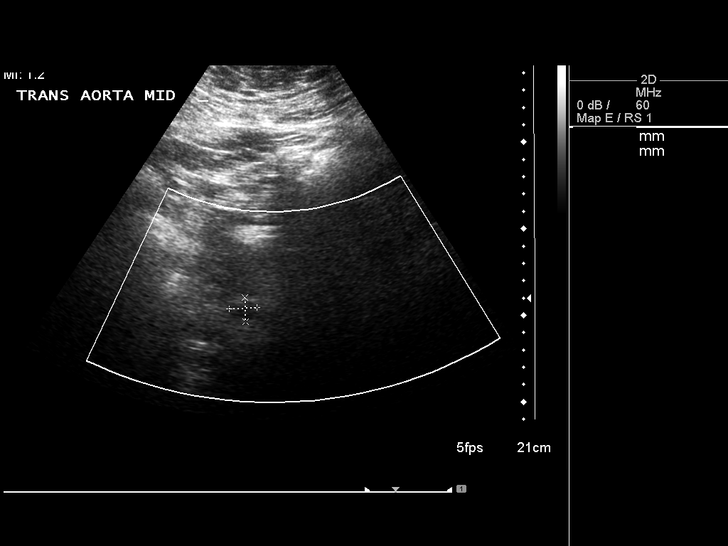
[im 14/80]
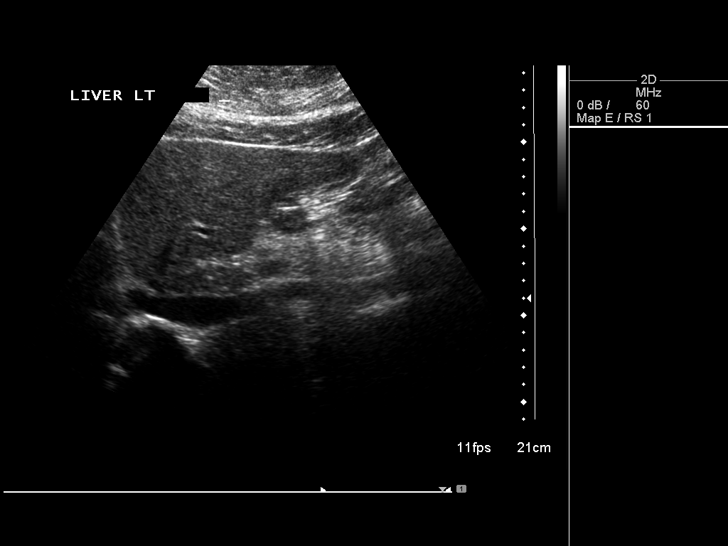
[im 20/80]
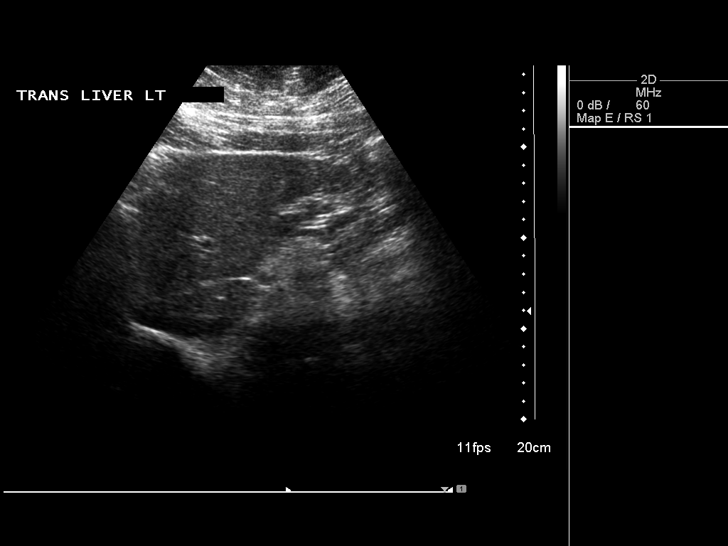
[im 27/80]
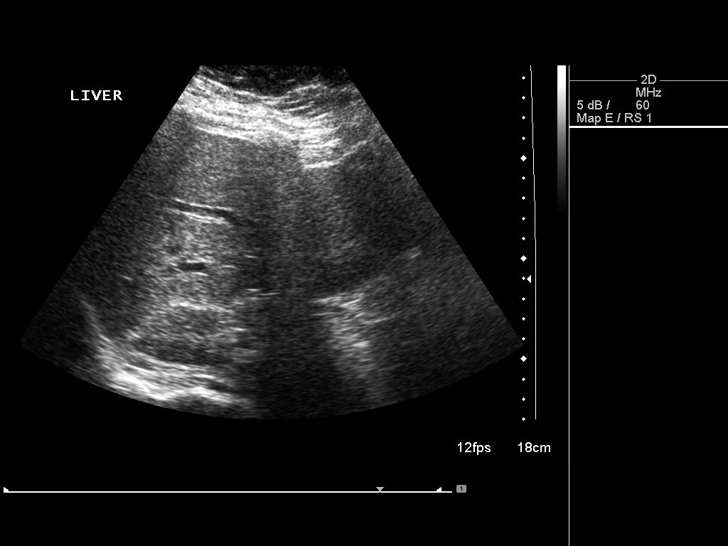
[im 30/80]
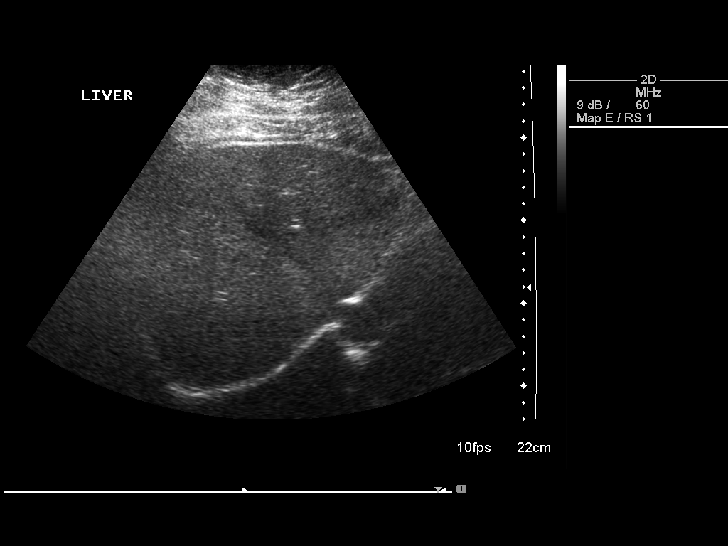
[im 37/80]
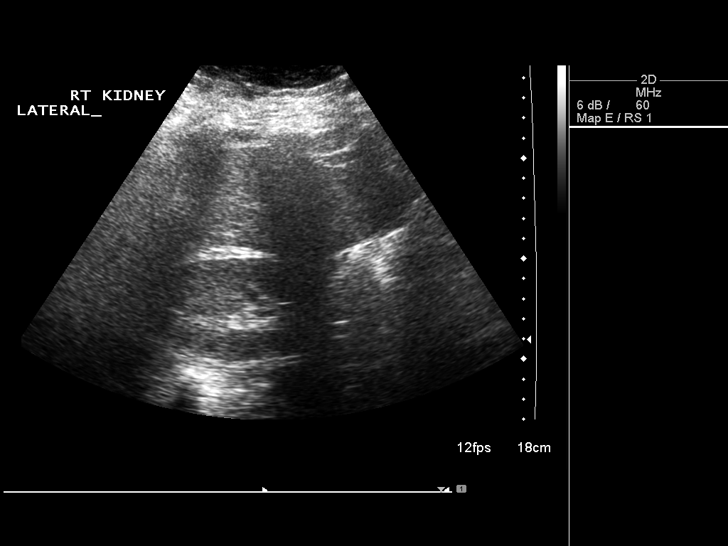
[im 43/80]
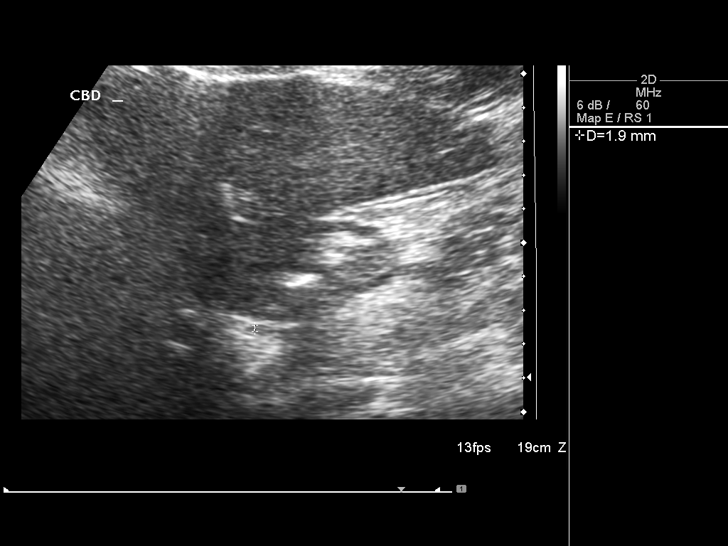
[im 50/80]
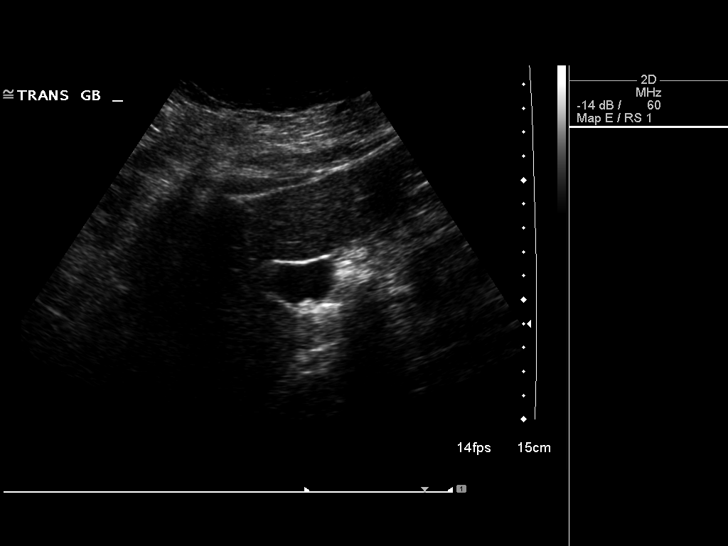
[im 53/80]
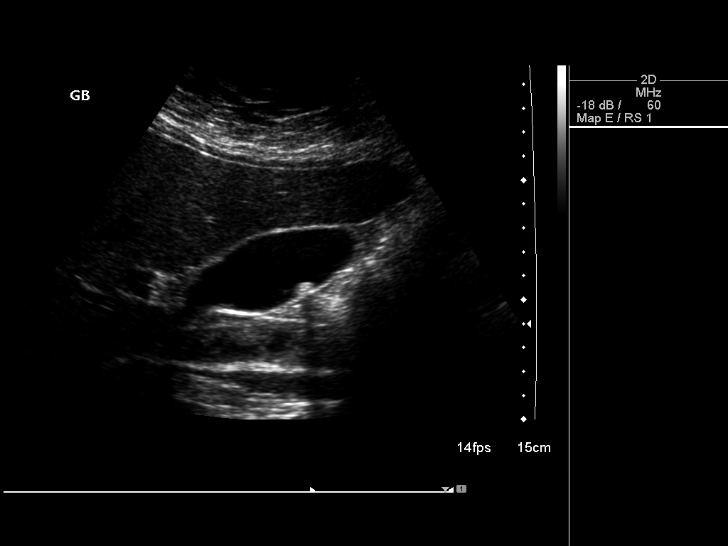
[im 60/80]
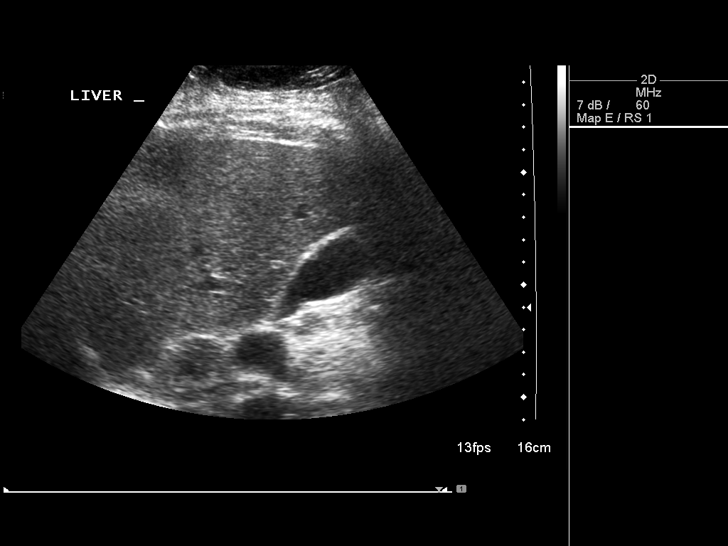
[im 66/80]
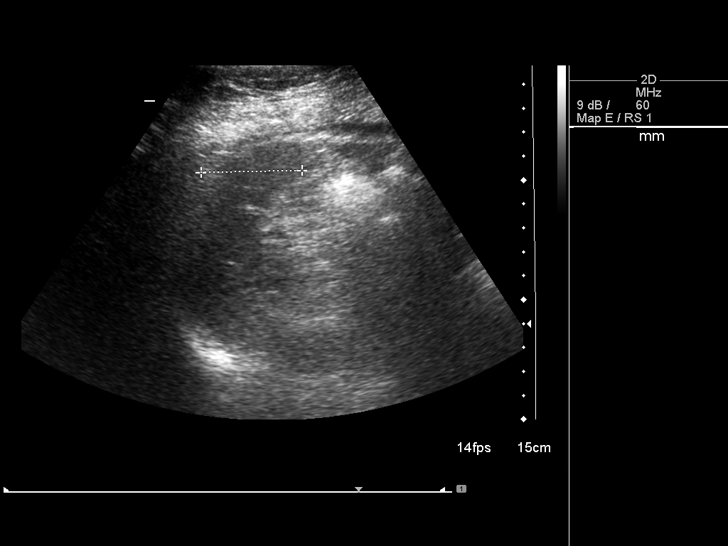
[im 73/80]
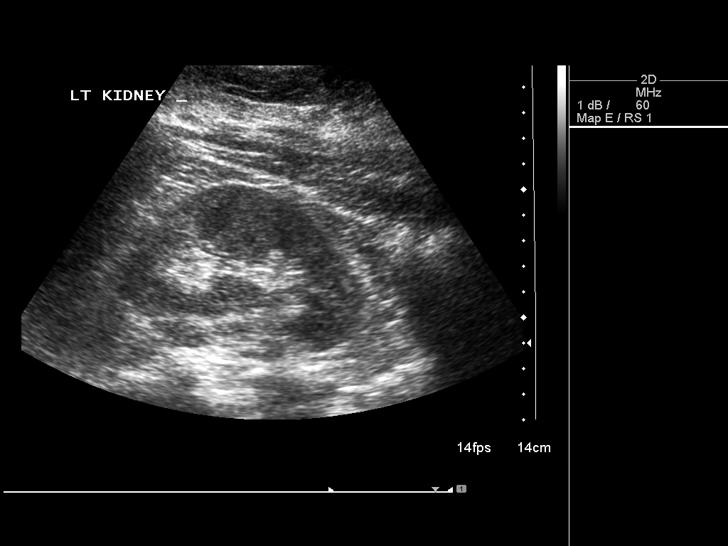
[im 80/80]
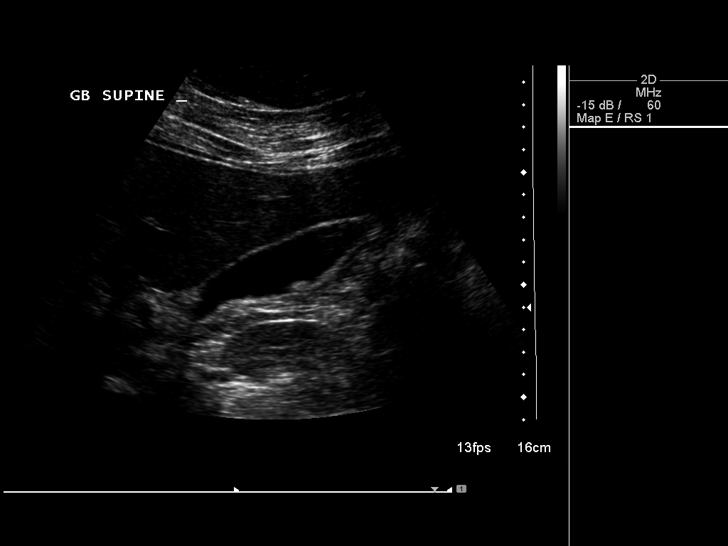

[14 of 25 positions shown; findings below may reference images not displayed]

FINDINGS: Gallbladder: 9 mm shadowing gallstone within gallbladder. Probable
small gallbladder polyp 5 mm diameter. No gallbladder wall
thickening, pericholecystic fluid, or sonographic Murphy sign.

Common bile duct: Diameter: 2 mm diameter, normal

Liver: Minimally increased echogenicity of the liver suggesting
subtle fatty infiltration. No focal hepatic mass or nodularity.
Hepatopetal portal venous flow.

IVC: Normal appearance

Pancreas: Normal appearance

Spleen: Normal appearance, 4.2 cm length

Right Kidney: Length: 11.6 cm. Normal morphology without mass or
hydronephrosis.

Left Kidney: Length: 10.5 cm. Normal morphology without mass or
hydronephrosis.

Abdominal aorta: Normal caliber

Other findings: No free-fluid
IMPRESSION: 9 mm shadowing gallstone within gallbladder without evidence acute
cholecystitis.

Probable 5 mm gallbladder polyp.

Probable fatty infiltration of liver.

## 2015-01-04 ENCOUNTER — Ambulatory Visit: Payer: BLUE CROSS/BLUE SHIELD | Admitting: Internal Medicine

## 2015-08-21 ENCOUNTER — Other Ambulatory Visit: Payer: Self-pay | Admitting: Physician Assistant

## 2015-08-21 DIAGNOSIS — Z1231 Encounter for screening mammogram for malignant neoplasm of breast: Secondary | ICD-10-CM

## 2015-09-08 IMAGING — US US ABDOMEN LIMITED
1 series · 14 of 25 positions shown · non-contrast
Comparison: [DATE]

CLINICAL DATA: Right upper quadrant pain

EXAM:
US ABDOMEN LIMITED - RIGHT UPPER QUADRANT

[Series 1: us abdomen limited · 0.25mm/px · 14 of 55 slices shown]
[im 1/55]
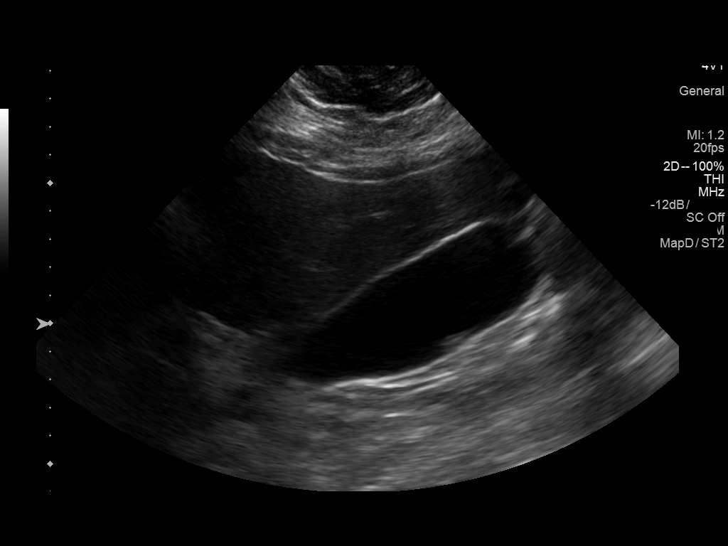
[im 5/55]
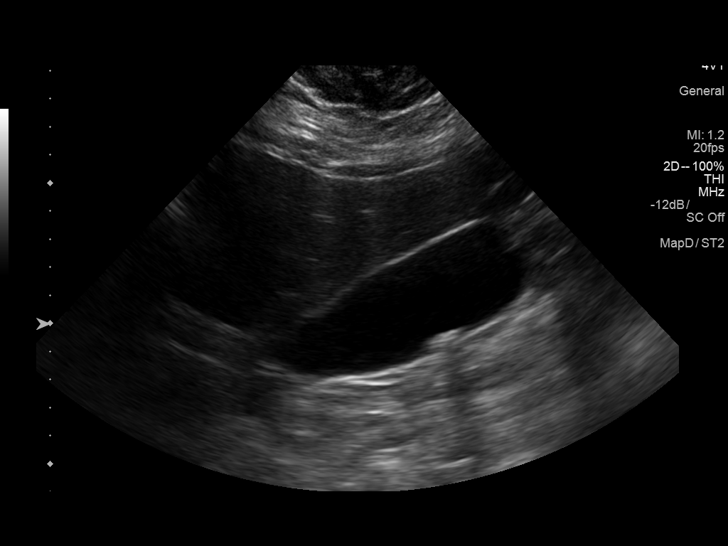
[im 10/55]
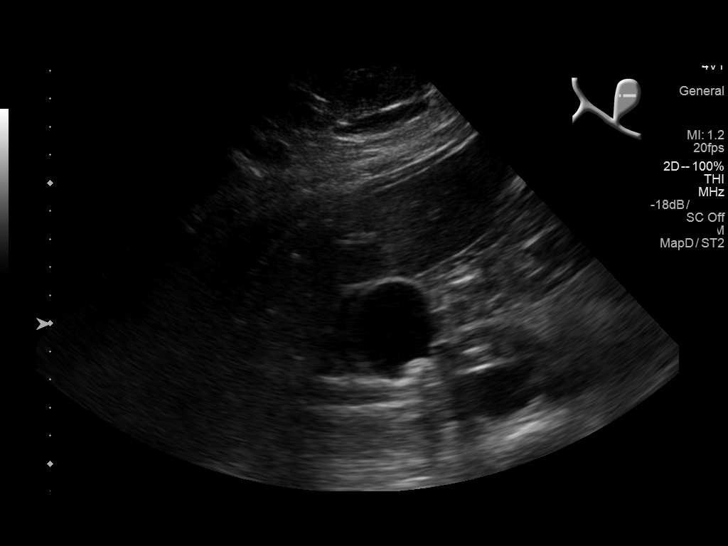
[im 14/55]
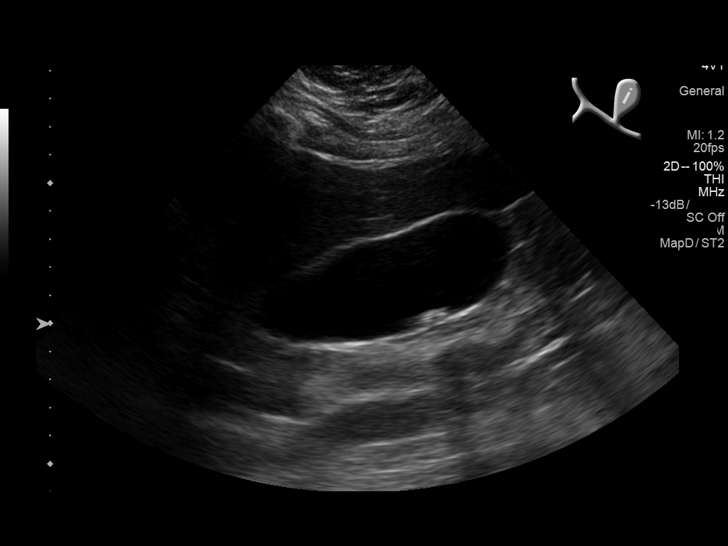
[im 19/55]
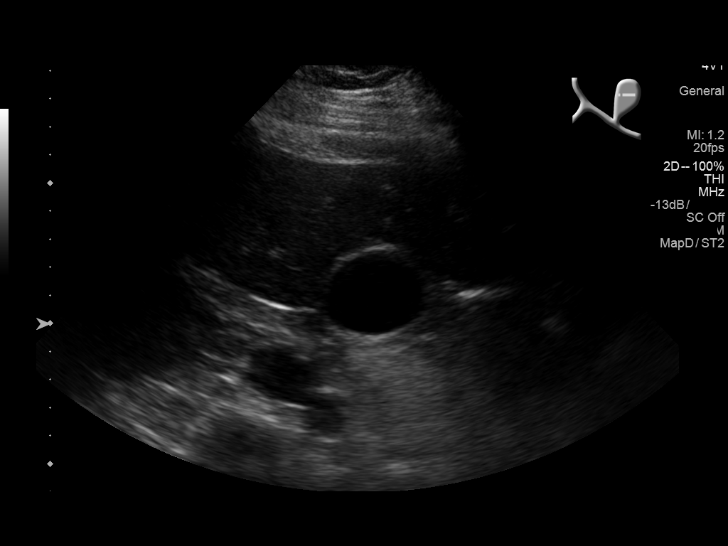
[im 21/55]
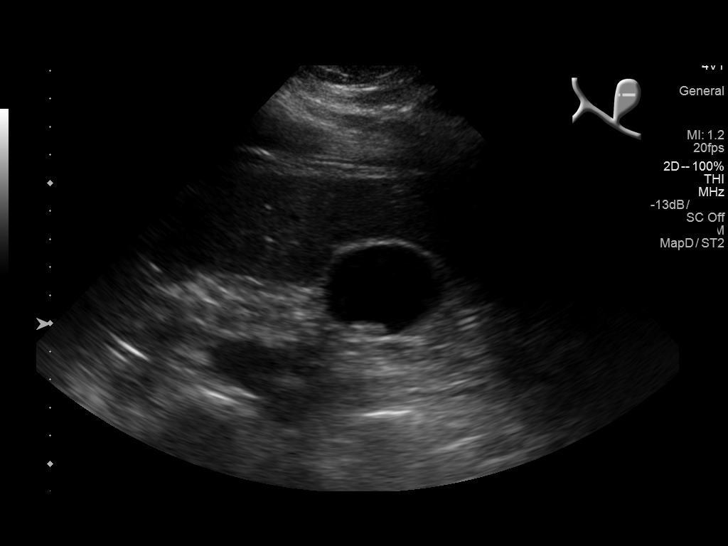
[im 25/55]
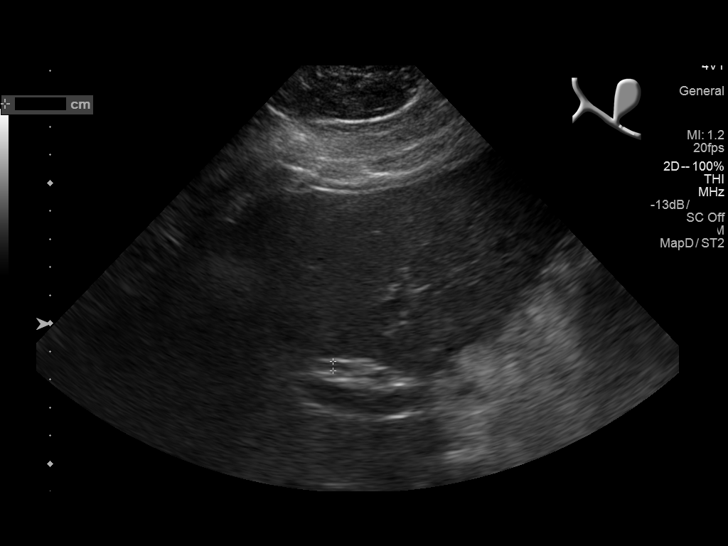
[im 30/55]
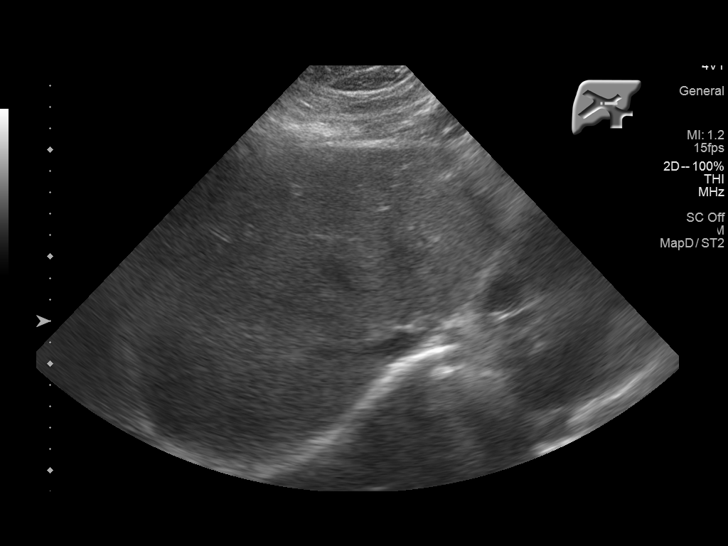
[im 34/55]
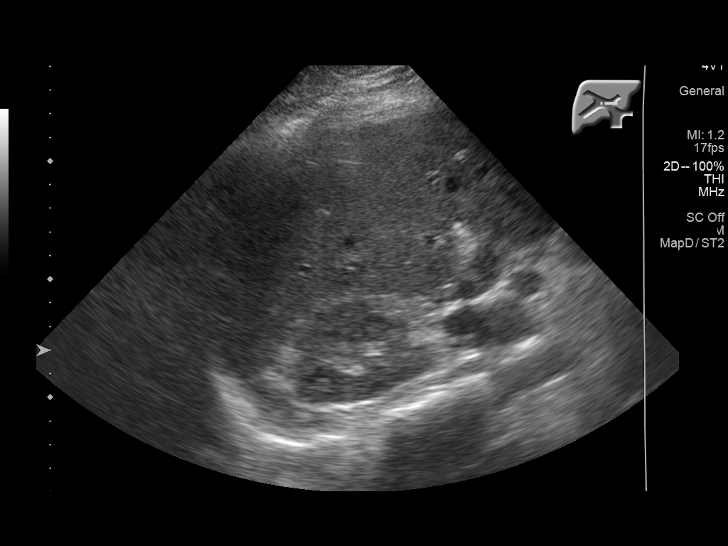
[im 37/55]
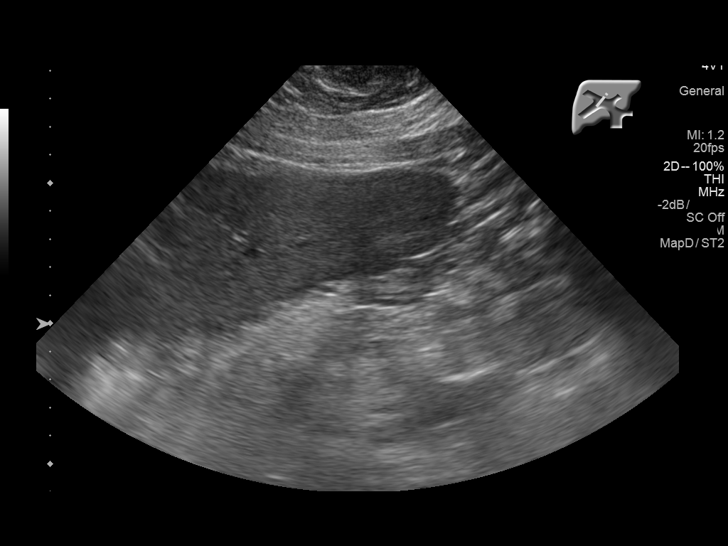
[im 41/55]
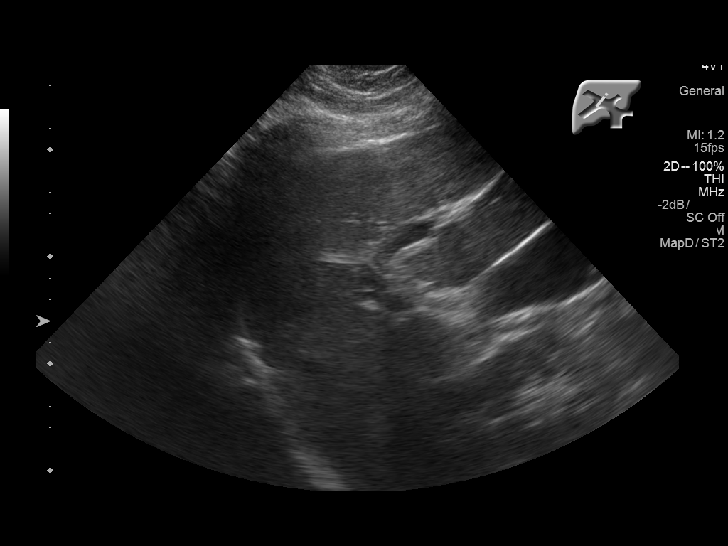
[im 46/55]
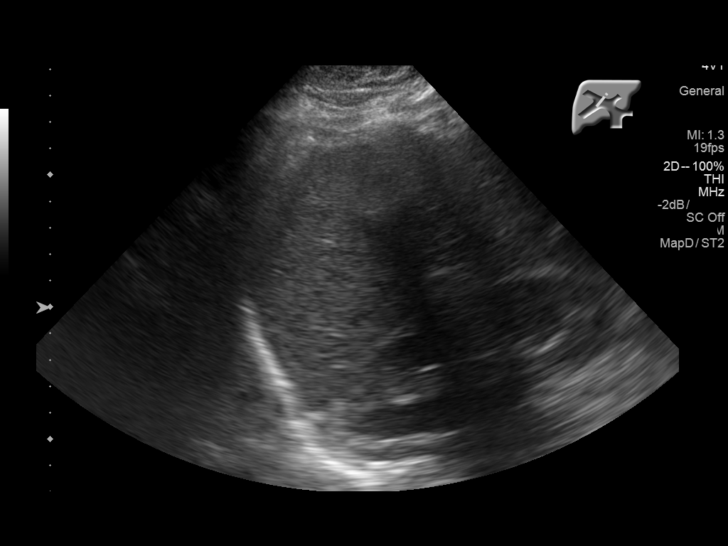
[im 50/55]
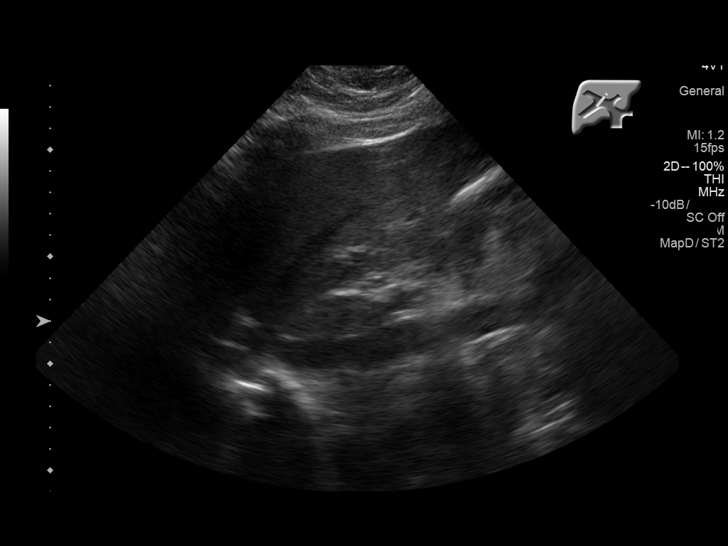
[im 55/55]
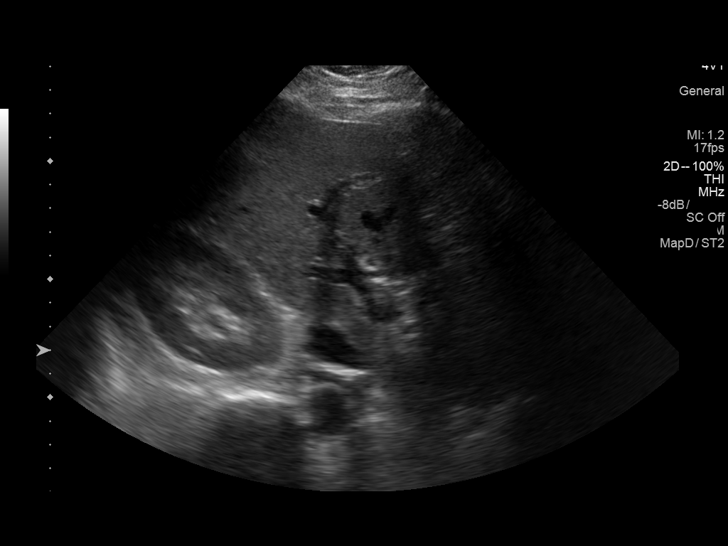

[14 of 25 positions shown; findings below may reference images not displayed]

FINDINGS: Gallbladder:

There is a 12 mm calculus within the gallbladder lumen. There is no
gallbladder mural thickening or pericholecystic fluid. The patient
was not tender to probe pressure over the gallbladder.

Common bile duct:

Diameter: Normal, 3 mm.

Liver:

No focal liver lesions. No intrahepatic bile duct dilatation. Mild
generalized echogenicity of hepatic parenchyma, suggesting fatty
infiltration.
IMPRESSION: Cholelithiasis. Probable mild fatty infiltration. No sonographic
evidence of acute cholecystitis.

## 2015-09-11 ENCOUNTER — Emergency Department (HOSPITAL_COMMUNITY)
Admission: EM | Admit: 2015-09-11 | Discharge: 2015-09-12 | Disposition: A | Discharge status: Home / Self Care | Payer: Medicaid Other | Attending: Emergency Medicine | Admitting: Emergency Medicine

## 2015-09-11 ENCOUNTER — Emergency Department (HOSPITAL_COMMUNITY): Payer: Medicaid Other

## 2015-09-11 ENCOUNTER — Encounter (HOSPITAL_COMMUNITY): Payer: Self-pay | Admitting: Emergency Medicine

## 2015-09-11 DIAGNOSIS — E669 Obesity, unspecified: Secondary | ICD-10-CM | POA: Insufficient documentation

## 2015-09-11 DIAGNOSIS — Z7952 Long term (current) use of systemic steroids: Secondary | ICD-10-CM | POA: Insufficient documentation

## 2015-09-11 DIAGNOSIS — K8020 Calculus of gallbladder without cholecystitis without obstruction: Secondary | ICD-10-CM

## 2015-09-11 DIAGNOSIS — Z8659 Personal history of other mental and behavioral disorders: Secondary | ICD-10-CM | POA: Diagnosis not present

## 2015-09-11 DIAGNOSIS — Z880 Allergy status to penicillin: Secondary | ICD-10-CM | POA: Insufficient documentation

## 2015-09-11 DIAGNOSIS — Z79899 Other long term (current) drug therapy: Secondary | ICD-10-CM | POA: Insufficient documentation

## 2015-09-11 DIAGNOSIS — K8050 Calculus of bile duct without cholangitis or cholecystitis without obstruction: Secondary | ICD-10-CM

## 2015-09-11 DIAGNOSIS — R1011 Right upper quadrant pain: Secondary | ICD-10-CM

## 2015-09-11 DIAGNOSIS — Z8739 Personal history of other diseases of the musculoskeletal system and connective tissue: Secondary | ICD-10-CM | POA: Diagnosis not present

## 2015-09-11 DIAGNOSIS — R109 Unspecified abdominal pain: Secondary | ICD-10-CM | POA: Diagnosis present

## 2015-09-11 DIAGNOSIS — Z87891 Personal history of nicotine dependence: Secondary | ICD-10-CM | POA: Insufficient documentation

## 2015-09-11 DIAGNOSIS — K219 Gastro-esophageal reflux disease without esophagitis: Secondary | ICD-10-CM | POA: Diagnosis not present

## 2015-09-11 LAB — CBC
HEMATOCRIT: 36 % (ref 36.0–46.0)
HEMOGLOBIN: 11.9 g/dL — AB (ref 12.0–15.0)
MCH: 28.7 pg (ref 26.0–34.0)
MCHC: 33.1 g/dL (ref 30.0–36.0)
MCV: 87 fL (ref 78.0–100.0)
Platelets: 234 10*3/uL (ref 150–400)
RBC: 4.14 MIL/uL (ref 3.87–5.11)
RDW: 16.1 % — ABNORMAL HIGH (ref 11.5–15.5)
WBC: 7.7 10*3/uL (ref 4.0–10.5)

## 2015-09-11 LAB — LIPASE, BLOOD: LIPASE: 30 U/L (ref 11–51)

## 2015-09-11 LAB — COMPREHENSIVE METABOLIC PANEL
ALBUMIN: 3.7 g/dL (ref 3.5–5.0)
ALK PHOS: 64 U/L (ref 38–126)
ALT: 12 U/L — ABNORMAL LOW (ref 14–54)
ANION GAP: 9 (ref 5–15)
AST: 17 U/L (ref 15–41)
BUN: 10 mg/dL (ref 6–20)
CALCIUM: 8.7 mg/dL — AB (ref 8.9–10.3)
CHLORIDE: 109 mmol/L (ref 101–111)
CO2: 22 mmol/L (ref 22–32)
Creatinine, Ser: 0.88 mg/dL (ref 0.44–1.00)
GFR calc non Af Amer: >60 mL/min (ref >60)
GLUCOSE: 111 mg/dL — AB (ref 65–99)
POTASSIUM: 3.5 mmol/L (ref 3.5–5.1)
SODIUM: 140 mmol/L (ref 135–145)
Total Bilirubin: 0.6 mg/dL (ref 0.3–1.2)
Total Protein: 7.2 g/dL (ref 6.5–8.1)

## 2015-09-11 LAB — I-STAT BETA HCG BLOOD, ED (MC, WL, AP ONLY)

## 2015-09-11 LAB — I-STAT TROPONIN, ED: Troponin i, poc: 0 ng/mL (ref 0.00–0.08)

## 2015-09-11 MED ORDER — ONDANSETRON HCL 4 MG/2ML IJ SOLN
4.0000 mg | Freq: Once | INTRAMUSCULAR | Status: AC
  Administered 2015-09-11: 4 mg via INTRAVENOUS
  Filled 2015-09-11: qty 2

## 2015-09-11 MED ORDER — HYDROMORPHONE HCL 1 MG/ML IJ SOLN
0.5000 mg | Freq: Once | INTRAMUSCULAR | Status: AC
  Administered 2015-09-11: 0.5 mg via INTRAVENOUS
  Filled 2015-09-11: qty 1

## 2015-09-11 MED ORDER — HYDROMORPHONE HCL 1 MG/ML IJ SOLN
1.0000 mg | Freq: Once | INTRAMUSCULAR | Status: AC
  Administered 2015-09-11: 1 mg via INTRAVENOUS
  Filled 2015-09-11: qty 1

## 2015-09-11 MED ORDER — GI COCKTAIL ~~LOC~~
30.0000 mL | Freq: Once | ORAL | Status: AC
  Administered 2015-09-11: 30 mL via ORAL
  Filled 2015-09-11: qty 30

## 2015-09-11 NOTE — ED Notes (Addendum)
Pt transported from home via EMS for c/o abd pain x 30 min, severe in nature per patient. Denies n/v/d, normal BM just PTA. A & O.  Pt denies urinary s/s, denies gyn s/s. Pt states she did take her Protonix at onset of pain with no relief. Pt states she does not "need" to take HTN meds daily.

## 2015-09-11 NOTE — ED Provider Notes (Signed)
CSN: [id number]     Arrival date & time 09/11/15  2015 History   First MD Initiated Contact with Patient 09/11/15 2134     Chief Complaint  Patient presents with  . Abdominal Pain     (Consider location/radiation/quality/duration/timing/severity/associated sxs/prior Treatment) HPI 50 year old female who presents with upper abdominal pain. History of obesity, gallstones, hyperlipidemia, and GERD. Had sudden onset of burning, sharp, abdominal pain in the upper abdomen about 1 hour prior to arrival. Has not had similar pain to this before. Has not had anything to eat since noon, and has not had a similar postprandial pain before. No nausea, vomiting, diarrhea, dysuria, urinary frequency, flank pain, fevers, chills, abnormal rectal or vaginal bleeding, abnormal vaginal discharge, chest pain or difficulty breathing. Past Medical History  Diagnosis Date  . Obesity   . Depression     treated with zoloft in past  . Hyperlipemia     no treatment required  . OA (osteoarthritis) of knee     bilaterally   . Allergic rhinitis   . GERD (gastroesophageal reflux disease)   . Lactose intolerance    Past Surgical History  Procedure Laterality Date  . Foot surgery     Family History  Problem Relation Age of Onset  . Cancer Father     throat  . Diabetes Other   . Cholelithiasis Other    Social History  Substance Use Topics  . Smoking status: Former Research scientist (life sciences)  . Smokeless tobacco: None  . Alcohol Use: No   OB History    No data available     Review of Systems 10/14 systems reviewed and are negative other than those stated in the HPI    Allergies  Penicillins  Home Medications   Prior to Admission medications   Medication Sig Start Date End Date Taking? Authorizing Provider  fexofenadine-pseudoephedrine (ALLEGRA-D 24) 180-240 MG per 24 hr tablet Take 1 tablet by mouth daily as needed. 09/28/14  Yes Aleksei Plotnikov V, MD  folic acid (FOLVITE) 1 MG tablet Take 1 mg by mouth daily.  08/22/15  Yes Historical Provider, MD  gabapentin (NEURONTIN) 300 MG capsule Take 300 mg by mouth at bedtime. 07/25/15  Yes Historical Provider, MD  hydrochlorothiazide (MICROZIDE) 12.5 MG capsule Take 12.5 mg by mouth daily as needed (fluid).  08/31/15  Yes Historical Provider, MD  HYDROcodone-acetaminophen (NORCO/VICODIN) 5-325 MG per tablet Take 1 tablet by mouth 2 (two) times daily as needed. 09/28/14  Yes Aleksei Plotnikov V, MD  ibuprofen (ADVIL,MOTRIN) 200 MG tablet Take 400 mg by mouth every 6 (six) hours as needed for headache or moderate pain.   Yes Historical Provider, MD  pantoprazole (PROTONIX) 40 MG tablet Take 1 tablet (40 mg total) by mouth daily. 09/28/14  Yes Aleksei Plotnikov V, MD  triamcinolone cream (KENALOG) 0.5 % Apply topically 3 (three) times daily. rash 09/28/14  Yes Aleksei Plotnikov V, MD  Vitamin D, Ergocalciferol, (DRISDOL) 50000 UNITS CAPS capsule TAKE ONE CAPSULE BY MOUTH ONCE A WEEK 11/29/14  Yes Aleksei Plotnikov V, MD  HYDROcodone-acetaminophen (NORCO/VICODIN) 5-325 MG tablet Take 1-2 tablets by mouth every 6 (six) hours as needed for moderate pain or severe pain. 09/12/15   Forde Dandy, MD   BP 116/60 mmHg  Pulse 78  Temp(Src) 98.1 F (36.7 C) (Oral)  Resp 15  Ht 5\' 5"  (1.651 m)  Wt 320 lb (145.151 kg)  BMI 53.25 kg/m2  SpO2 100%  LMP 09/04/2015 Physical Exam Physical Exam  Nursing note and vitals reviewed. Constitutional:  Well developed, well nourished, non-toxic, and very uncomfortable secondary to pain Head: Normocephalic and atraumatic.  Mouth/Throat: Oropharynx is clear and moist.  Neck: Normal range of motion. Neck supple.  Cardiovascular: Normal rate and regular rhythm.   Pulmonary/Chest: Effort normal and breath sounds normal.  Abdominal: Soft. Obese There is RUQ and epigastric tenderness. There is no rebound and no guarding.  Musculoskeletal: Normal range of motion.  Neurological: Alert, no facial droop, fluent speech, moves all extremities  symmetrically Skin: Skin is warm and dry.  Psychiatric: Cooperative  ED Course  Procedures (including critical care time) Labs Review Labs Reviewed  COMPREHENSIVE METABOLIC PANEL - Abnormal; Notable for the following:    Glucose, Bld 111 (*)    Calcium 8.7 (*)    ALT 12 (*)    All other components within normal limits  CBC - Abnormal; Notable for the following:    Hemoglobin 11.9 (*)    RDW 16.1 (*)    All other components within normal limits  LIPASE, BLOOD  URINALYSIS, ROUTINE W REFLEX MICROSCOPIC (NOT AT [facility])  I-STAT BETA HCG BLOOD, ED ([facility], [facility], [facility] ONLY)  I-STAT TROPOININ, ED    Imaging Review US Abdomen Limited Ruq  09/11/2015  CLINICAL DATA:  Right upper quadrant pain EXAM: US ABDOMEN LIMITED - RIGHT UPPER QUADRANT COMPARISON:  12/02/2014 FINDINGS: Gallbladder: There is a 12 mm calculus within the gallbladder lumen. There is no gallbladder mural thickening or pericholecystic fluid. The patient was not tender to probe pressure over the gallbladder. Common bile duct: Diameter: Normal, 3 mm. Liver: No focal liver lesions. No intrahepatic bile duct dilatation. Mild generalized echogenicity of hepatic parenchyma, suggesting fatty infiltration. IMPRESSION: Cholelithiasis. Probable mild fatty infiltration. No sonographic evidence of acute cholecystitis. Electronically Signed   By: Andreas Newport M.D.   On: 09/11/2015 22:38   I have personally reviewed and evaluated these images and lab results as part of my medical decision-making.   EKG Interpretation   Date/Time:  Monday September 11 2015 20:29:50 EDT Ventricular Rate:  79 PR Interval:  139 QRS Duration: 84 QT Interval:  372 QTC Calculation: 426 R Axis:   8 Text Interpretation:  Sinus rhythm Low voltage, precordial leads No acute  ischemic changes  Confirmed by Carrie Usery MD, Hinton Dyer [id number]) on 09/11/2015 11:50:57  PM      MDM   Final diagnoses:  RUQ pain  Biliary colic  Calculus of gallbladder without cholecystitis without  obstruction    50 year old female with history of cholelithiasis who presents with upper abdominal pain 1 hour prior to arrival. On presentation, appears very uncomfortable secondary to pain. Has a soft and nonsurgical abdomen, with epigastric and right upper quadrant tenderness to palpation. No tenderness at McBurney's point to suggest acute appendicitis or the lower abdomen. Basic blood work including CBC, lipase, copy of the metabolic profile, pregnancy test is unremarkable. Does not seem like an atypical presentation for that of ACS, and a troponin and EKG normal. Right upper quadrant ultrasound was performed with cholelithiasis but no evidence of biliary obstruction or findings concerning for acute cholecystitis. Her pain  was controlled after IV analgesics, and she was able to tolerate by mouth intake without any recurring abdominal pain. Is given referral for general surgery for discussion regarding need for elective cholecystectomy. Strict return instructions are also reviewed. She expressed understanding of all discharge instructions, and felt comfortable to plan of care.   Forde Dandy, MD 09/12/15 [phone]

## 2015-09-12 ENCOUNTER — Observation Stay (HOSPITAL_COMMUNITY): Payer: Medicaid Other | Admitting: Anesthesiology

## 2015-09-12 ENCOUNTER — Ambulatory Visit (HOSPITAL_COMMUNITY)
Admission: EM | Admit: 2015-09-12 | Discharge: 2015-09-13 | Disposition: A | Discharge status: Home / Self Care | Payer: Medicaid Other | Source: Home / Self Care | Attending: Emergency Medicine | Admitting: Emergency Medicine

## 2015-09-12 ENCOUNTER — Observation Stay (HOSPITAL_COMMUNITY): Payer: Medicaid Other

## 2015-09-12 ENCOUNTER — Encounter (HOSPITAL_COMMUNITY)
Admission: EM | Disposition: A | Discharge status: Home / Self Care | Payer: Self-pay | Source: Home / Self Care | Attending: Emergency Medicine

## 2015-09-12 ENCOUNTER — Encounter (HOSPITAL_COMMUNITY): Payer: Self-pay | Admitting: Emergency Medicine

## 2015-09-12 DIAGNOSIS — K8020 Calculus of gallbladder without cholecystitis without obstruction: Secondary | ICD-10-CM | POA: Diagnosis present

## 2015-09-12 DIAGNOSIS — Z419 Encounter for procedure for purposes other than remedying health state, unspecified: Secondary | ICD-10-CM

## 2015-09-12 HISTORY — PX: CHOLECYSTECTOMY: SHX55

## 2015-09-12 LAB — CBC WITH DIFFERENTIAL/PLATELET
Basophils Absolute: 0 10*3/uL (ref 0.0–0.1)
Basophils Relative: 0 %
Eosinophils Absolute: 0 10*3/uL (ref 0.0–0.7)
Eosinophils Relative: 0 %
HEMATOCRIT: 34.3 % — AB (ref 36.0–46.0)
HEMOGLOBIN: 11.5 g/dL — AB (ref 12.0–15.0)
LYMPHS ABS: 1.5 10*3/uL (ref 0.7–4.0)
Lymphocytes Relative: 19 %
MCH: 28.9 pg (ref 26.0–34.0)
MCHC: 33.5 g/dL (ref 30.0–36.0)
MCV: 86.2 fL (ref 78.0–100.0)
MONOS PCT: 9 %
Monocytes Absolute: 0.7 10*3/uL (ref 0.1–1.0)
NEUTROS ABS: 5.8 10*3/uL (ref 1.7–7.7)
NEUTROS PCT: 72 %
Platelets: 223 10*3/uL (ref 150–400)
RBC: 3.98 MIL/uL (ref 3.87–5.11)
RDW: 15.9 % — ABNORMAL HIGH (ref 11.5–15.5)
WBC: 8.1 10*3/uL (ref 4.0–10.5)

## 2015-09-12 LAB — COMPREHENSIVE METABOLIC PANEL
ALBUMIN: 3.5 g/dL (ref 3.5–5.0)
ALK PHOS: 85 U/L (ref 38–126)
ALT: 44 U/L (ref 14–54)
ANION GAP: 7 (ref 5–15)
AST: 92 U/L — AB (ref 15–41)
BILIRUBIN TOTAL: 1.2 mg/dL (ref 0.3–1.2)
BUN: 9 mg/dL (ref 6–20)
CO2: 24 mmol/L (ref 22–32)
Calcium: 8.6 mg/dL — ABNORMAL LOW (ref 8.9–10.3)
Chloride: 108 mmol/L (ref 101–111)
Creatinine, Ser: 0.66 mg/dL (ref 0.44–1.00)
GFR calc Af Amer: >60 mL/min (ref >60)
GFR calc non Af Amer: >60 mL/min (ref >60)
GLUCOSE: 101 mg/dL — AB (ref 65–99)
POTASSIUM: 3.5 mmol/L (ref 3.5–5.1)
Sodium: 139 mmol/L (ref 135–145)
Total Protein: 6.9 g/dL (ref 6.5–8.1)

## 2015-09-12 LAB — LIPASE, BLOOD: Lipase: 26 U/L (ref 11–51)

## 2015-09-12 IMAGING — RF DG CHOLANGIOGRAM OPERATIVE
1 series · 4 of 4 positions shown · non-contrast
Comparison: Abdominal ultrasound - [DATE]

CLINICAL DATA: History of gallstones. Laparoscopic cholecystectomy.

EXAM:
INTRAOPERATIVE CHOLANGIOGRAM
FLUOROSCOPY TIME:  11.4 seconds (11.1 mGy)

[Series 1: run · 4 of 47 frames shown]
[frame 8/47]
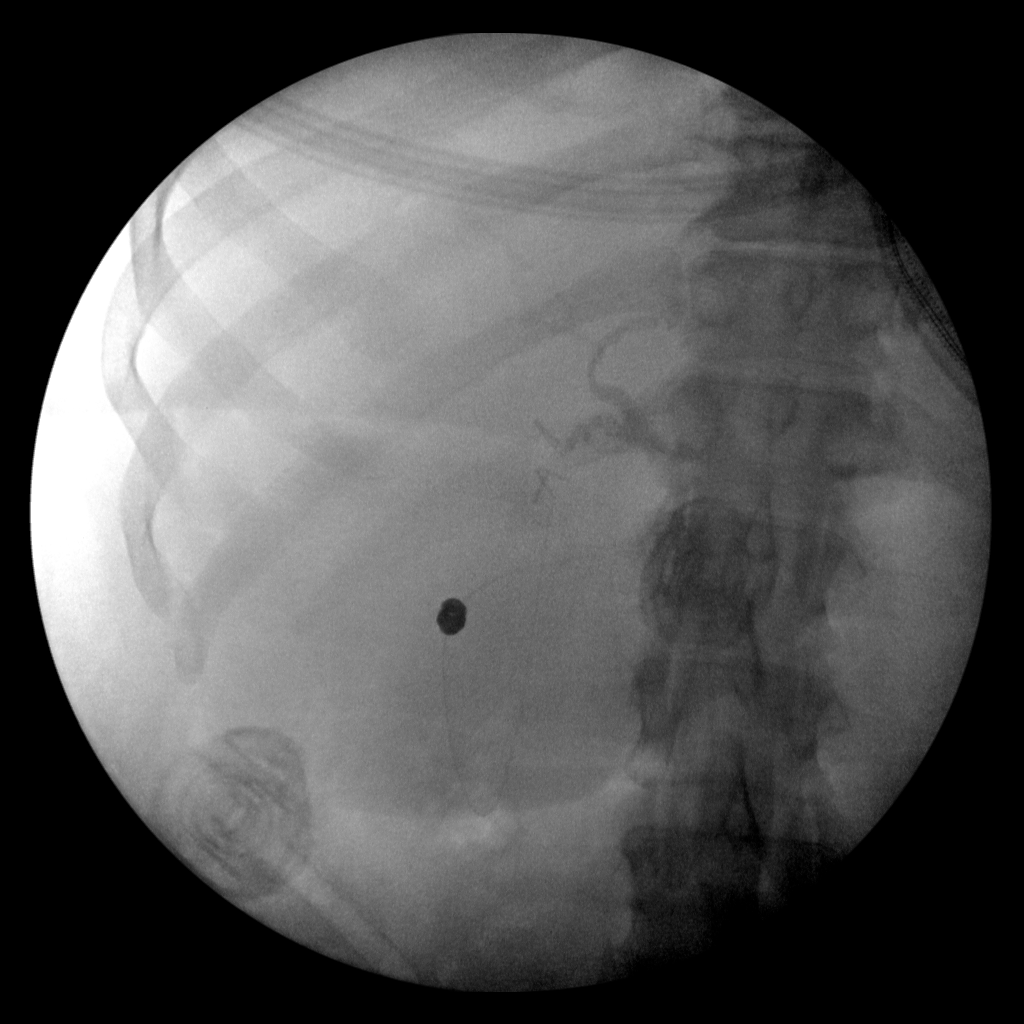
[frame 24/47]
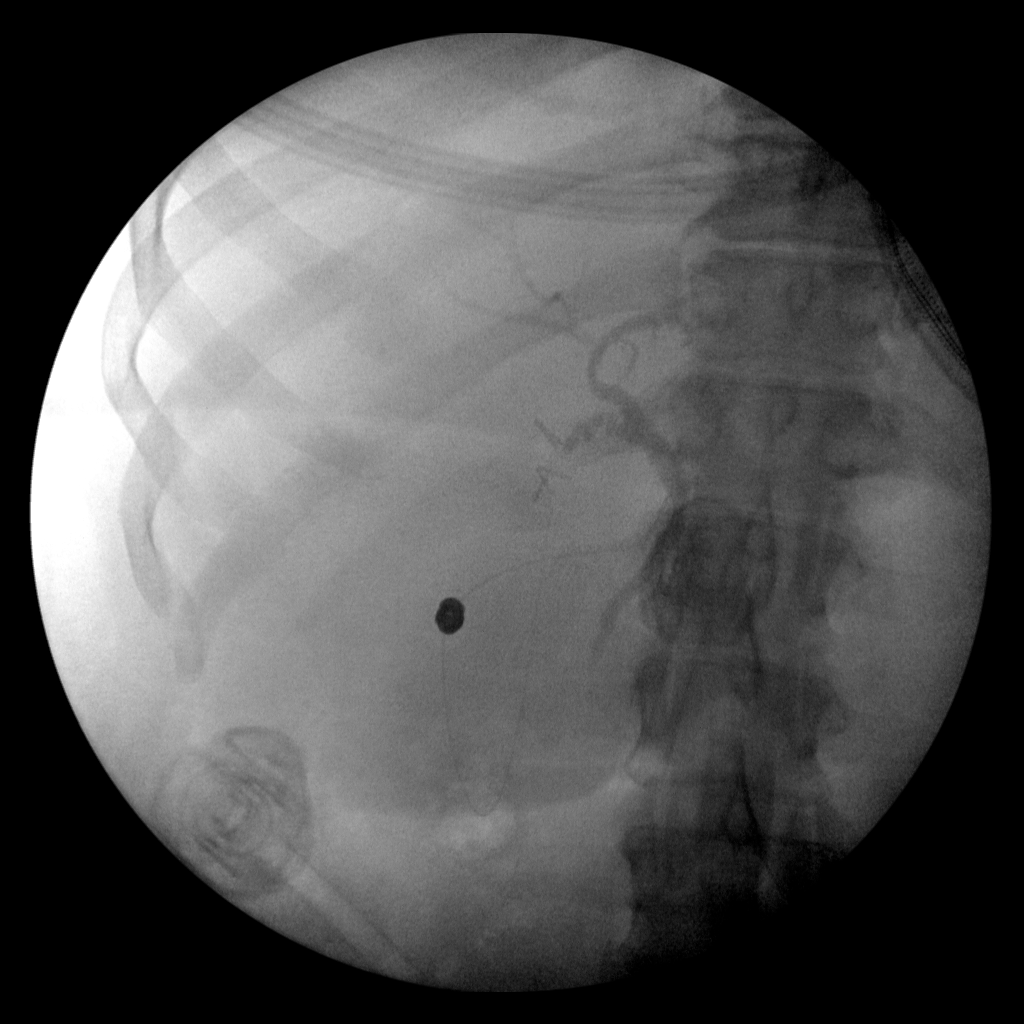
[frame 40/47]
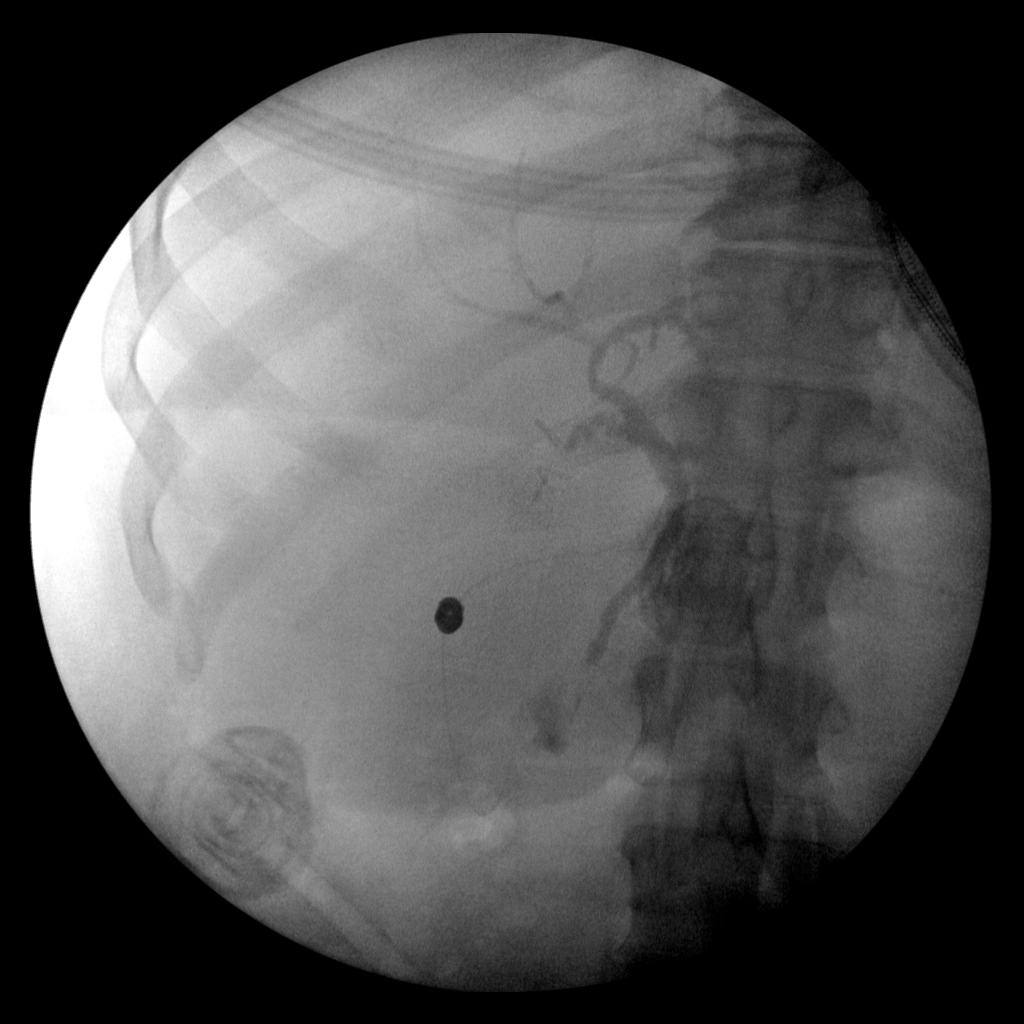
[frame 47/47]
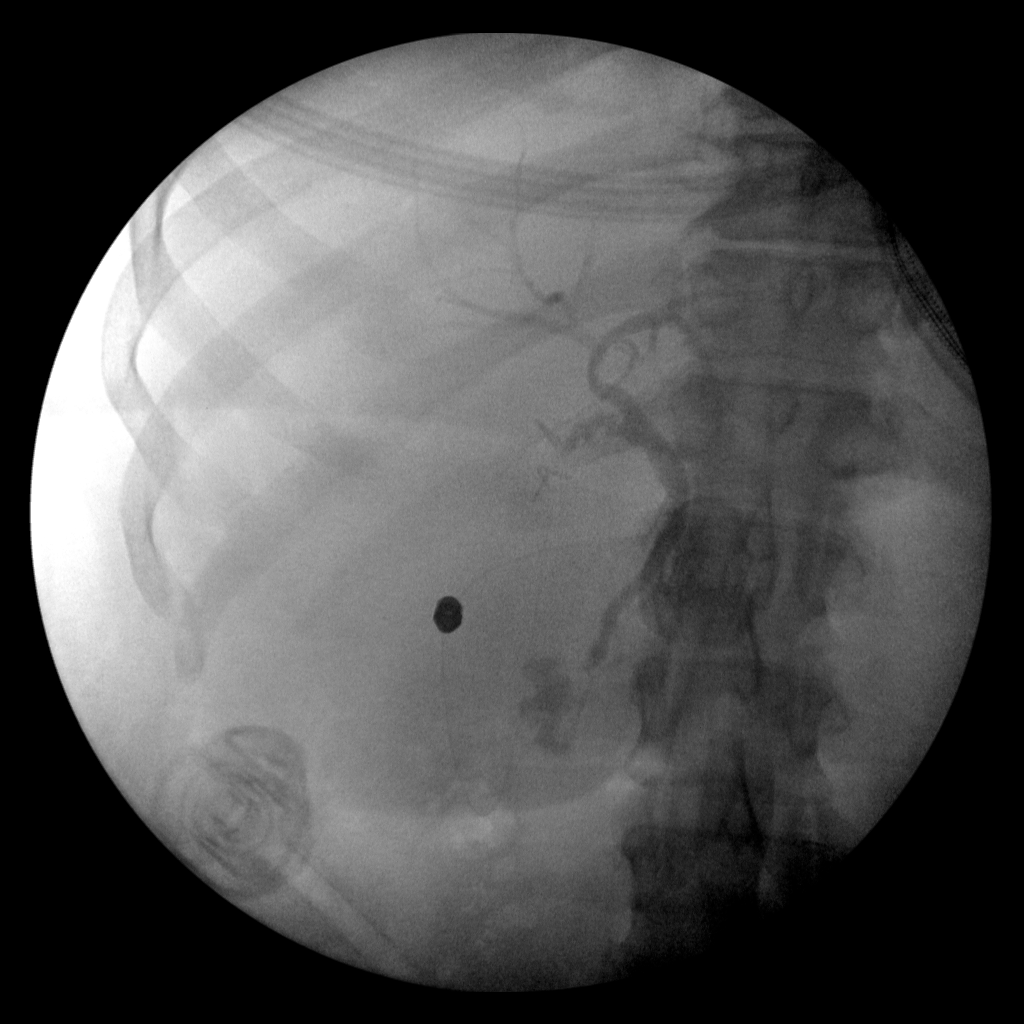

[4 of 4 positions shown; findings below may reference images not displayed]

FINDINGS: Intraoperative cholangiographic images of the right upper abdominal
quadrant during laparoscopic cholecystectomy are provided for
review.

Surgical clips overlie the expected location of the gallbladder
fossa.

Contrast injection demonstrates selective cannulation of the central
aspect of the cystic duct.

There is passage of contrast through the central aspect of the
cystic duct with filling of a non dilated common bile duct. There is
passage of contrast though the CBD and into the descending portion
of the duodenum.

There is minimal reflux of injected contrast into the common hepatic
duct and central aspect of the non dilated intrahepatic biliary
system. There is minimal opacification the central aspect of the
pancreatic duct which appears nondilated.

There are no discrete filling defects within the opacified portions
of the biliary system to suggest the presence of
choledocholithiasis.
IMPRESSION: No evidence of choledocholithiasis.

## 2015-09-12 SURGERY — LAPAROSCOPIC CHOLECYSTECTOMY WITH INTRAOPERATIVE CHOLANGIOGRAM
Anesthesia: General | Site: Abdomen

## 2015-09-12 MED ORDER — LACTATED RINGERS IV SOLN
INTRAVENOUS | Status: DC | PRN
  Administered 2015-09-12 (×2): via INTRAVENOUS

## 2015-09-12 MED ORDER — DEXAMETHASONE SODIUM PHOSPHATE 10 MG/ML IJ SOLN
INTRAMUSCULAR | Status: AC
  Filled 2015-09-12: qty 1

## 2015-09-12 MED ORDER — ROCURONIUM BROMIDE 100 MG/10ML IV SOLN
INTRAVENOUS | Status: AC
  Filled 2015-09-12: qty 1

## 2015-09-12 MED ORDER — PANTOPRAZOLE SODIUM 40 MG IV SOLR
40.0000 mg | Freq: Every day | INTRAVENOUS | Status: DC
  Administered 2015-09-12: 40 mg via INTRAVENOUS
  Filled 2015-09-12 (×3): qty 40

## 2015-09-12 MED ORDER — HYDRALAZINE HCL 20 MG/ML IJ SOLN: 10.0000 mg | INTRAMUSCULAR | Status: DC | PRN

## 2015-09-12 MED ORDER — ACETAMINOPHEN 10 MG/ML IV SOLN
INTRAVENOUS | Status: DC | PRN
  Administered 2015-09-12: 1000 mg via INTRAVENOUS

## 2015-09-12 MED ORDER — PROPOFOL 10 MG/ML IV BOLUS
INTRAVENOUS | Status: AC
  Filled 2015-09-12: qty 40

## 2015-09-12 MED ORDER — ACETAMINOPHEN 10 MG/ML IV SOLN
INTRAVENOUS | Status: AC
  Filled 2015-09-12: qty 100

## 2015-09-12 MED ORDER — LACTATED RINGERS IV SOLN
INTRAVENOUS | Status: DC
  Administered 2015-09-12: 1000 mL via INTRAVENOUS

## 2015-09-12 MED ORDER — GABAPENTIN 300 MG PO CAPS
300.0000 mg | ORAL_CAPSULE | Freq: Every day | ORAL | Status: DC
  Administered 2015-09-12: 300 mg via ORAL
  Filled 2015-09-12 (×2): qty 1

## 2015-09-12 MED ORDER — ONDANSETRON HCL 4 MG/2ML IJ SOLN
INTRAMUSCULAR | Status: AC
  Filled 2015-09-12: qty 2

## 2015-09-12 MED ORDER — HYDROMORPHONE HCL 1 MG/ML IJ SOLN
1.0000 mg | Freq: Once | INTRAMUSCULAR | Status: AC
  Administered 2015-09-12: 1 mg via INTRAVENOUS
  Filled 2015-09-12: qty 1

## 2015-09-12 MED ORDER — DEXAMETHASONE SODIUM PHOSPHATE 10 MG/ML IJ SOLN
INTRAMUSCULAR | Status: DC | PRN
  Administered 2015-09-12: 10 mg via INTRAVENOUS

## 2015-09-12 MED ORDER — PROPOFOL 10 MG/ML IV BOLUS
INTRAVENOUS | Status: DC | PRN
  Administered 2015-09-12: 100 mg via INTRAVENOUS
  Administered 2015-09-12: 250 mg via INTRAVENOUS

## 2015-09-12 MED ORDER — HYDROMORPHONE HCL 1 MG/ML IJ SOLN
0.5000 mg | INTRAMUSCULAR | Status: DC | PRN
Start: 2015-09-12 — End: 2015-09-13
  Administered 2015-09-12: 1 mg via INTRAVENOUS
  Administered 2015-09-12: 0.5 mg via INTRAVENOUS
  Administered 2015-09-13: 1 mg via INTRAVENOUS
  Filled 2015-09-12 (×3): qty 1

## 2015-09-12 MED ORDER — SODIUM CHLORIDE 0.9 % IV BOLUS (SEPSIS)
500.0000 mL | Freq: Once | INTRAVENOUS | Status: AC
  Administered 2015-09-12: 500 mL via INTRAVENOUS

## 2015-09-12 MED ORDER — POTASSIUM CHLORIDE IN NACL 20-0.9 MEQ/L-% IV SOLN
INTRAVENOUS | Status: DC
  Administered 2015-09-12 – 2015-09-13 (×3): via INTRAVENOUS
  Filled 2015-09-12 (×6): qty 1000

## 2015-09-12 MED ORDER — BUPIVACAINE HCL (PF) 0.5 % IJ SOLN
INTRAMUSCULAR | Status: AC
  Filled 2015-09-12: qty 30

## 2015-09-12 MED ORDER — ONDANSETRON HCL 4 MG/2ML IJ SOLN
4.0000 mg | Freq: Once | INTRAMUSCULAR | Status: AC
  Administered 2015-09-12: 4 mg via INTRAVENOUS
  Filled 2015-09-12: qty 2

## 2015-09-12 MED ORDER — 0.9 % SODIUM CHLORIDE (POUR BTL) OPTIME
TOPICAL | Status: DC | PRN
  Administered 2015-09-12: 1000 mL

## 2015-09-12 MED ORDER — HYDROMORPHONE HCL 1 MG/ML IJ SOLN: 0.2500 mg | INTRAMUSCULAR | Status: DC | PRN

## 2015-09-12 MED ORDER — DIPHENHYDRAMINE HCL 25 MG PO CAPS: 25.0000 mg | ORAL_CAPSULE | Freq: Four times a day (QID) | ORAL | Status: DC | PRN

## 2015-09-12 MED ORDER — ROCURONIUM BROMIDE 100 MG/10ML IV SOLN
INTRAVENOUS | Status: DC | PRN
  Administered 2015-09-12: 35 mg via INTRAVENOUS
  Administered 2015-09-12: 5 mg via INTRAVENOUS

## 2015-09-12 MED ORDER — LORATADINE 10 MG PO TABS
10.0000 mg | ORAL_TABLET | Freq: Every day | ORAL | Status: DC
  Administered 2015-09-12: 10 mg via ORAL
  Filled 2015-09-12 (×2): qty 1

## 2015-09-12 MED ORDER — FENTANYL CITRATE (PF) 250 MCG/5ML IJ SOLN
INTRAMUSCULAR | Status: AC
  Filled 2015-09-12: qty 5

## 2015-09-12 MED ORDER — GLYCOPYRROLATE 0.2 MG/ML IJ SOLN
INTRAMUSCULAR | Status: DC | PRN
  Administered 2015-09-12: .8 mg via INTRAVENOUS

## 2015-09-12 MED ORDER — LABETALOL HCL 5 MG/ML IV SOLN
INTRAVENOUS | Status: AC
  Filled 2015-09-12: qty 4

## 2015-09-12 MED ORDER — METHOCARBAMOL 500 MG PO TABS
500.0000 mg | ORAL_TABLET | Freq: Four times a day (QID) | ORAL | Status: DC | PRN
  Administered 2015-09-12: 500 mg via ORAL
  Filled 2015-09-12: qty 1

## 2015-09-12 MED ORDER — ACETAMINOPHEN 650 MG RE SUPP: 650.0000 mg | Freq: Four times a day (QID) | RECTAL | Status: DC | PRN

## 2015-09-12 MED ORDER — DIPHENHYDRAMINE HCL 50 MG/ML IJ SOLN: 25.0000 mg | Freq: Four times a day (QID) | INTRAMUSCULAR | Status: DC | PRN

## 2015-09-12 MED ORDER — ACETAMINOPHEN 325 MG PO TABS: 650.0000 mg | ORAL_TABLET | Freq: Four times a day (QID) | ORAL | Status: DC | PRN

## 2015-09-12 MED ORDER — HYDROCODONE-ACETAMINOPHEN 5-325 MG PO TABS: 1.0000 | ORAL_TABLET | Freq: Four times a day (QID) | ORAL | Status: DC | PRN

## 2015-09-12 MED ORDER — HYDROCODONE-ACETAMINOPHEN 5-325 MG PO TABS
1.0000 | ORAL_TABLET | ORAL | Status: DC | PRN
  Administered 2015-09-12 – 2015-09-13 (×2): 2 via ORAL
  Filled 2015-09-12 (×2): qty 2

## 2015-09-12 MED ORDER — ONDANSETRON HCL 4 MG/2ML IJ SOLN
4.0000 mg | Freq: Four times a day (QID) | INTRAMUSCULAR | Status: DC | PRN
  Administered 2015-09-12: 4 mg via INTRAVENOUS
  Filled 2015-09-12: qty 2

## 2015-09-12 MED ORDER — IOPAMIDOL (ISOVUE-300) INJECTION 61%
INTRAVENOUS | Status: AC
  Filled 2015-09-12: qty 50

## 2015-09-12 MED ORDER — NEOSTIGMINE METHYLSULFATE 10 MG/10ML IV SOLN
INTRAVENOUS | Status: DC | PRN
  Administered 2015-09-12: 4 mg via INTRAVENOUS

## 2015-09-12 MED ORDER — LIDOCAINE HCL (CARDIAC) 20 MG/ML IV SOLN
INTRAVENOUS | Status: DC | PRN
  Administered 2015-09-12: 75 mg via INTRAVENOUS
  Administered 2015-09-12: 25 mg via INTRATRACHEAL

## 2015-09-12 MED ORDER — BUPIVACAINE HCL 0.5 % IJ SOLN
INTRAMUSCULAR | Status: DC | PRN
  Administered 2015-09-12: 18 mL

## 2015-09-12 MED ORDER — SUCCINYLCHOLINE CHLORIDE 20 MG/ML IJ SOLN
INTRAMUSCULAR | Status: DC | PRN
  Administered 2015-09-12: 140 mg via INTRAVENOUS

## 2015-09-12 MED ORDER — MIDAZOLAM HCL 2 MG/2ML IJ SOLN
INTRAMUSCULAR | Status: AC
  Filled 2015-09-12: qty 2

## 2015-09-12 MED ORDER — LABETALOL HCL 5 MG/ML IV SOLN
INTRAVENOUS | Status: DC | PRN
  Administered 2015-09-12: 2.5 mg via INTRAVENOUS
  Administered 2015-09-12: 5 mg via INTRAVENOUS

## 2015-09-12 MED ORDER — IOPAMIDOL (ISOVUE-300) INJECTION 61%
INTRAVENOUS | Status: DC | PRN
  Administered 2015-09-12: 50 mL via INTRAVENOUS

## 2015-09-12 MED ORDER — LACTATED RINGERS IR SOLN
Status: DC | PRN
  Administered 2015-09-12: 1000 mL

## 2015-09-12 MED ORDER — DEXTROSE 5 % IV SOLN
2.0000 g | INTRAVENOUS | Status: DC
  Administered 2015-09-12: 2 g via INTRAVENOUS
  Filled 2015-09-12: qty 2

## 2015-09-12 MED ORDER — LIDOCAINE HCL (CARDIAC) 20 MG/ML IV SOLN
INTRAVENOUS | Status: AC
  Filled 2015-09-12: qty 5

## 2015-09-12 MED ORDER — FENTANYL CITRATE (PF) 100 MCG/2ML IJ SOLN
INTRAMUSCULAR | Status: DC | PRN
  Administered 2015-09-12: 100 ug via INTRAVENOUS
  Administered 2015-09-12 (×2): 50 ug via INTRAVENOUS
  Administered 2015-09-12: 100 ug via INTRAVENOUS
  Administered 2015-09-12 (×2): 50 ug via INTRAVENOUS
  Administered 2015-09-12: 100 ug via INTRAVENOUS

## 2015-09-12 MED ORDER — ONDANSETRON HCL 4 MG/2ML IJ SOLN
INTRAMUSCULAR | Status: DC | PRN
  Administered 2015-09-12: 4 mg via INTRAVENOUS

## 2015-09-12 SURGICAL SUPPLY — 43 items
APL SKNCLS STERI-STRIP NONHPOA (GAUZE/BANDAGES/DRESSINGS) ×1
APPLIER CLIP 5 13 M/L LIGAMAX5 (MISCELLANEOUS) ×2
APR CLP MED LRG 5 ANG JAW (MISCELLANEOUS) ×1
BAG SPEC RTRVL LRG 6X4 10 (ENDOMECHANICALS) ×1
BENZOIN TINCTURE PRP APPL 2/3 (GAUZE/BANDAGES/DRESSINGS) ×2 IMPLANT
CATH REDDICK CHOLANGI 4FR 50CM (CATHETERS) ×2 IMPLANT
CHLORAPREP W/TINT 26ML (MISCELLANEOUS) ×2 IMPLANT
CLIP APPLIE 5 13 M/L LIGAMAX5 (MISCELLANEOUS) ×1 IMPLANT
COVER MAYO STAND STRL (DRAPES) ×2 IMPLANT
COVER SURGICAL LIGHT HANDLE (MISCELLANEOUS) ×2 IMPLANT
DECANTER SPIKE VIAL GLASS SM (MISCELLANEOUS) ×2 IMPLANT
DISSECTOR BLUNT TIP ENDO 5MM (MISCELLANEOUS) IMPLANT
DRAPE C-ARM 42X120 X-RAY (DRAPES) ×2 IMPLANT
DRAPE LAPAROSCOPIC ABDOMINAL (DRAPES) ×2 IMPLANT
DRSG TEGADERM 2-3/8X2-3/4 SM (GAUZE/BANDAGES/DRESSINGS) ×4 IMPLANT
DRSG TEGADERM 4X4.75 (GAUZE/BANDAGES/DRESSINGS) ×1 IMPLANT
ELECT REM PT RETURN 9FT ADLT (ELECTROSURGICAL) ×2
ELECTRODE REM PT RTRN 9FT ADLT (ELECTROSURGICAL) ×1 IMPLANT
ENDOLOOP SUT PDS II  0 18 (SUTURE)
ENDOLOOP SUT PDS II 0 18 (SUTURE) IMPLANT
GAUZE SPONGE 2X2 8PLY STRL LF (GAUZE/BANDAGES/DRESSINGS) ×1 IMPLANT
GLOVE ECLIPSE 8.0 STRL XLNG CF (GLOVE) ×2 IMPLANT
GLOVE INDICATOR 8.0 STRL GRN (GLOVE) ×2 IMPLANT
GOWN STRL REUS W/TWL XL LVL3 (GOWN DISPOSABLE) ×7 IMPLANT
HEMOSTAT SNOW SURGICEL 2X4 (HEMOSTASIS) IMPLANT
HOVERMATT SINGLE USE (MISCELLANEOUS) ×1 IMPLANT
IV CATH 14GX2 1/4 (CATHETERS) ×2 IMPLANT
KIT BASIN OR (CUSTOM PROCEDURE TRAY) ×2 IMPLANT
POSITIONER SURGICAL ARM (MISCELLANEOUS) IMPLANT
POUCH SPECIMEN RETRIEVAL 10MM (ENDOMECHANICALS) ×2 IMPLANT
SCISSORS LAP 5X35 DISP (ENDOMECHANICALS) ×2 IMPLANT
SET IRRIG TUBING LAPAROSCOPIC (IRRIGATION / IRRIGATOR) ×2 IMPLANT
SLEEVE XCEL OPT CAN 5 100 (ENDOMECHANICALS) ×4 IMPLANT
SPONGE GAUZE 2X2 STER 10/PKG (GAUZE/BANDAGES/DRESSINGS) ×1
STRIP CLOSURE SKIN 1/2X4 (GAUZE/BANDAGES/DRESSINGS) ×2 IMPLANT
SUT MNCRL AB 4-0 PS2 18 (SUTURE) ×2 IMPLANT
TAPE CLOTH 4X10 WHT NS (GAUZE/BANDAGES/DRESSINGS) IMPLANT
TOWEL OR 17X26 10 PK STRL BLUE (TOWEL DISPOSABLE) ×2 IMPLANT
TOWEL OR NON WOVEN STRL DISP B (DISPOSABLE) IMPLANT
TRAY LAPAROSCOPIC (CUSTOM PROCEDURE TRAY) ×2 IMPLANT
TROCAR BLADELESS OPT 5 100 (ENDOMECHANICALS) ×2 IMPLANT
TROCAR XCEL BLUNT TIP 100MML (ENDOMECHANICALS) ×2 IMPLANT
TROCAR XCEL NON-BLD 11X100MML (ENDOMECHANICALS) IMPLANT

## 2015-09-12 NOTE — ED Notes (Signed)
CONSENT SIGNED BY PT 

## 2015-09-12 NOTE — Progress Notes (Addendum)
Entered in pt d/c instructions  medicaid [place] access covered patient [place]: [address] [place], [place] [address] [email or web address] Use this website to assist with understanding your coverage & to renew application As a Medicaid client you MUST contact DSS/SSI each time you change address, move to another [place] or another state to keep your address updated  [place] Medicaid Transportation to Dr appts if you are have full Medicaid: [id number], [phone]

## 2015-09-12 NOTE — Transfer of Care (Signed)
Immediate Anesthesia Transfer of Care Note  Patient: Charlotte Meyer  Procedure(s) Performed: Procedure(s): LAPAROSCOPIC CHOLECYSTECTOMY WITH INTRAOPERATIVE CHOLANGIOGRAM (N/A)  Patient Location: PACU  Anesthesia Type:General  Level of Consciousness: awake, alert , oriented and patient cooperative  Airway & Oxygen Therapy: Patient Spontanous Breathing and Patient connected to face mask oxygen  Post-op Assessment: Report given to RN, Post -op Vital signs reviewed and stable and Patient moving all extremities X 4  Post vital signs: stable  Last Vitals:  Filed Vitals:   09/12/15 1309 09/12/15 1409  BP: 116/61 145/80  Pulse: 81 75  Temp: 37.1 C 36.7 C  Resp: 16 16    Complications: No apparent anesthesia complications

## 2015-09-12 NOTE — Progress Notes (Signed)
5w notified pt will be in room in 20 minutes

## 2015-09-12 NOTE — Anesthesia Procedure Notes (Signed)
Procedure Name: Intubation Date/Time: 09/12/2015 3:50 PM Performed by: Lissa Morales Pre-anesthesia Checklist: Patient identified, Emergency Drugs available, Suction available and Patient being monitored Patient Re-evaluated:Patient Re-evaluated prior to inductionOxygen Delivery Method: Circle System Utilized Preoxygenation: Pre-oxygenation with 100% oxygen Intubation Type: IV induction Ventilation: Mask ventilation without difficulty Laryngoscope Size: Mac, Glidescope and 3 Grade View: Grade III Tube type: Oral Tube size: 7.5 mm Number of attempts: 1 Airway Equipment and Method: Stylet and Oral airway Placement Confirmation: ETT inserted through vocal cords under direct vision,  positive ETCO2 and breath sounds checked- equal and bilateral Secured at: 21 cm Tube secured with: Tape Dental Injury: Teeth and Oropharynx as per pre-operative assessment  Difficulty Due To: Difficulty was anticipated, Difficult Airway- due to large tongue and Difficult Airway- due to dentition Comments: Elective glidescope  Secondary to obesity. Overbite, gaps in front teeth, large tongue MAP 3

## 2015-09-12 NOTE — ED Notes (Signed)
Surgeon at bedside.  

## 2015-09-12 NOTE — Discharge Instructions (Signed)
Return without fail for worsening symptoms, including fever, vomiting and unable to keep down food/fluids, worsening pain, new symptoms such as chest pain or sob, or any other symptoms concerning to you.   Biliary Colic Biliary colic is a pain in the upper abdomen. The pain:  Is usually felt on the right side of the abdomen, but it may also be felt in the center of the abdomen, just below the breastbone (sternum).  May spread back toward the right shoulder blade.  May be steady or irregular.  May be accompanied by nausea and vomiting. Most of the time, the pain goes away in 1-5 hours. After the most intense pain passes, the abdomen may continue to ache mildly for about 24 hours. Biliary colic is caused by a blockage in the bile duct. The bile duct is a pathway that carries bile--a liquid that helps to digest fats--from the gallbladder to the small intestine. Biliary colic usually occurs after eating, when the digestive system demands bile. The pain develops when muscle cells contract forcefully to try to move the blockage so that bile can get by. HOME CARE INSTRUCTIONS  Take medicines only as directed by your health care provider.  Drink enough fluid to keep your urine clear or pale yellow.  Avoid fatty, greasy, and fried foods. These kinds of foods increase your body's demand for bile.  Avoid any foods that make your pain worse.  Avoid overeating.  Avoid having a large meal after fasting. SEEK MEDICAL CARE IF:  You develop a fever.  Your pain gets worse.  You vomit.  You develop nausea that prevents you from eating and drinking. SEEK IMMEDIATE MEDICAL CARE IF:  You suddenly develop a fever and shaking chills.  You develop a yellowish discoloration (jaundice) of:  Skin.  Whites of the eyes.  Mucous membranes.  You have continuous or severe pain that is not relieved with medicines.  You have nausea and vomiting that is not relieved with medicines.  You develop  dizziness or you faint.   This information is not intended to replace advice given to you by your health care provider. Make sure you discuss any questions you have with your health care provider.   Document Released: 10/28/2005 Document Revised: 10/11/2014 Document Reviewed: 03/08/2014 Elsevier Interactive Patient Education 2016 Else[employer].  Abdominal Pain, Adult Many things can cause belly (abdominal) pain. Most times, the belly pain is not dangerous. Many cases of belly pain can be watched and treated at home. HOME CARE   Do not take medicines that help you go poop (laxatives) unless told to by your doctor.  Only take medicine as told by your doctor.  Eat or drink as told by your doctor. Your doctor will tell you if you should be on a special diet. GET HELP IF:  You do not know what is causing your belly pain.  You have belly pain while you are sick to your stomach (nauseous) or have runny poop (diarrhea).  You have pain while you pee or poop.  Your belly pain wakes you up at night.  You have belly pain that gets worse or better when you eat.  You have belly pain that gets worse when you eat fatty foods.  You have a fever. GET HELP RIGHT AWAY IF:   The pain does not go away within 2 hours.  You keep throwing up (vomiting).  The pain changes and is only in the right or left part of the belly.  You have bloody  or tarry looking poop. MAKE SURE YOU:   Understand these instructions.  Will watch your condition.  Will get help right away if you are not doing well or get worse.   This information is not intended to replace advice given to you by your health care provider. Make sure you discuss any questions you have with your health care provider.   Document Released: 11/13/2007 Document Revised: 06/17/2014 Document Reviewed: 02/03/2013 Elsevier Interactive Patient Education [employer].

## 2015-09-12 NOTE — Op Note (Signed)
OPERATIVE NOTE-LAPAROSCOPIC CHOLECYSTECTOMY  Preoperative diagnosis:  Symptomatic cholelithiasis  Postoperative diagnosis:  Same  Procedure: Laparoscopic cholecystectomy with cholangiogram.  Surgeon:  Zella Richer  Asst.:  Kinsinger  Anesthesia: General  Indication:   This is a 50 year old female who has come to the ER twice in the past 24 hours because of severe biliary colic type pain.  She has cholelithiasis by [place] and normal LFTs.  She now presents for cholecystectomy.  Technique: She was brought to the operating room, placed supine on the operating table, and a general anesthetic was administered. The abdominal wall was then sterilely prepped and draped. Local anesthetic (Marcaine) was infiltrated in the subumbilical region. A small subumbilical incision was made through the skin, subcutaneous tissue, fascia, and peritoneum entering the peritoneal cavity under direct vision. A pursestring suture of 0 Vicryl was placed around the edges of the fascia. A Hassan trocar was introduced into the peritoneal cavity and a pneumoperitoneum was created by insufflation of carbon dioxide gas. The laparoscope was introduced into the trocar and no underlying bleeding or organ injury was noted. She was then placed in the reverse Trendelenburg position with the right side tilted slightly up.  Three 5 mm trocars were then placed into the abdominal cavity under laparoscopic vision. One in the epigastric area, and 2 in the right upper quadrant area. The gallbladder was visualized and the fundus was grasped and retracted toward the right shoulder.  There were no acute inflammatory changes.  The infundibulum was mobilized with dissection close to the gallbladder and retracted laterally. The cystic duct was identified and a window was created around it. The anterior and posterior branches of the cystic artery were identified and a window was created around them.  They were clipped and divided. The critical view was  achieved. A clip was placed at the neck of the gallbladder. A small incision was made in the cystic duct. A cholangiocatheter was introduced through the anterior abdominal wall and placed in the cystic duct. A intraoperative cholangiogram was then performed.  Under real-time fluoroscopy, dilute contrast was injected into the cystic duct.  The common hepatic duct, the right and left hepatic ducts, and the common duct were all visualized. Contrast drained into the duodenum without obvious evidence of any obstructing ductal lesion. The final report is pending the Radiologist's interpretation.  The cholangiocatheter was removed, the cystic duct was clipped 3 times on the biliary side, and then the cystic duct was divided sharply. No bile leak was noted from the cystic duct stump.   Following this the gallbladder was dissected free from the liver using electrocautery. The gallbladder was then placed in a retrieval bag and removed from the abdominal cavity through the subumbilical incision.  The gallbladder fossa was inspected, irrigated, and bleeding was controlled with electrocautery. Inspection showed that hemostasis was adequate and there was no evidence of bile leak.  The irrigation fluid was evacuated as much as possible.  The subumbilical trocar was removed and the fascial defect was closed by tightening and tying down the pursestring suture under laparoscopic vision.  The remaining trocars were removed and the pneumoperitoneum was released. The skin incisions were closed with 4-0 Monocryl subcuticular stitches. Steri-Strips and sterile dressings were applied.  The procedure was well-tolerated without any apparent complications. She was taken to the recovery room in satisfactory condition.

## 2015-09-12 NOTE — ED Notes (Signed)
Per pt, states was here for same symptoms yesterday-sent here for admission by PCP

## 2015-09-12 NOTE — Progress Notes (Signed)
Pt is stating that Vicodin only relieves pain for a short while and is wondering if she can have a different prescription for home

## 2015-09-12 NOTE — Anesthesia Postprocedure Evaluation (Signed)
Anesthesia Post Note  Patient: Charlotte Meyer  Procedure(s) Performed: Procedure(s) (LRB): LAPAROSCOPIC CHOLECYSTECTOMY WITH INTRAOPERATIVE CHOLANGIOGRAM (N/A)  Patient location during evaluation: PACU Anesthesia Type: General Level of consciousness: awake and alert Pain management: pain level controlled Vital Signs Assessment: post-procedure vital signs reviewed and stable Respiratory status: spontaneous breathing, nonlabored ventilation and respiratory function stable Cardiovascular status: blood pressure returned to baseline and stable Postop Assessment: no signs of nausea or vomiting Anesthetic complications: no    Last Vitals:  Filed Vitals:   09/12/15 1734 09/12/15 1748  BP: 121/66 131/109  Pulse: 64 58  Temp: 36.7 C   Resp: 18 20    Last Pain:  Filed Vitals:   09/12/15 1751  PainSc: 2                  Kyona Chauncey,W. EDMOND

## 2015-09-12 NOTE — ED Notes (Signed)
5 WEST MADE AWARE PT IS GOING TO OR

## 2015-09-12 NOTE — Anesthesia Preprocedure Evaluation (Addendum)
Anesthesia Evaluation  Patient identified by MRN, date of birth, ID band Patient awake    Reviewed: Allergy & Precautions, H&P , NPO status , Patient's Chart, lab work & pertinent test results  Airway Mallampati: III  TM Distance: >3 FB Neck ROM: Full    Dental no notable dental hx. (+) Teeth Intact, Dental Advisory Given   Pulmonary neg pulmonary ROS, former smoker,    Pulmonary exam normal breath sounds clear to auscultation       Cardiovascular negative cardio ROS   Rhythm:Regular Rate:Normal     Neuro/Psych Anxiety Depression negative neurological ROS     GI/Hepatic Neg liver ROS, GERD  Medicated,  Endo/Other  Morbid obesity  Renal/GU negative Renal ROS  negative genitourinary   Musculoskeletal  (+) Arthritis , Osteoarthritis,    Abdominal   Peds  Hematology negative hematology ROS (+)   Anesthesia Other Findings   Reproductive/Obstetrics negative OB ROS                           Anesthesia Physical Anesthesia Plan  ASA: III  Anesthesia Plan: General   Post-op Pain Management:    Induction: Intravenous  Airway Management Planned: Oral ETT  Additional Equipment:   Intra-op Plan:   Post-operative Plan: Extubation in OR  Informed Consent: I have reviewed the patients History and Physical, chart, labs and discussed the procedure including the risks, benefits and alternatives for the proposed anesthesia with the patient or authorized representative who has indicated his/her understanding and acceptance.   Dental advisory given  Plan Discussed with: CRNA  Anesthesia Plan Comments:         Anesthesia Quick Evaluation

## 2015-09-12 NOTE — ED Provider Notes (Signed)
CSN: [id number]     Arrival date & time 09/12/15  1023 History   First MD Initiated Contact with Patient 09/12/15 1054     Chief Complaint  Patient presents with  . Abdominal Pain     (Consider location/radiation/quality/duration/timing/severity/associated sxs/prior Treatment) HPI   Patient presents with epigastric and RUQ pain that has been constant since yesterday around 7pm.  Pain is sharp and stabbing.   N/V yesterday.  Pain occasionally radiates to right shoulder.  No exacerbating or palliative symptoms.  Denies fevers, chills, bodyaches.  No N/V today.  No change in bowel habits.  Denies urinary or vaginal symptoms.  No hx abdominal surgeries.  Was seen at ED last night and diagnosed with biliary colic, seen by PCP this morning who called ED and sent pt back to the hospital for persistent pain, requested admission.  Pt ate oatmeal this morning for breakfast this morning.  Past Medical History  Diagnosis Date  . Obesity   . Depression     treated with zoloft in past  . Hyperlipemia     no treatment required  . OA (osteoarthritis) of knee     bilaterally   . Allergic rhinitis   . GERD (gastroesophageal reflux disease)   . Lactose intolerance    Past Surgical History  Procedure Laterality Date  . Foot surgery     Family History  Problem Relation Age of Onset  . Cancer Father     throat  . Diabetes Other   . Cholelithiasis Other    Social History  Substance Use Topics  . Smoking status: Former Research scientist (life sciences)  . Smokeless tobacco: None  . Alcohol Use: No   OB History    No data available     Review of Systems  All other systems reviewed and are negative.     Allergies  Penicillins  Home Medications   Prior to Admission medications   Medication Sig Start Date End Date Taking? Authorizing Provider  fexofenadine-pseudoephedrine (ALLEGRA-D 24) 180-240 MG per 24 hr tablet Take 1 tablet by mouth daily as needed. 09/28/14   Aleksei Plotnikov V, MD  folic acid (FOLVITE) 1  MG tablet Take 1 mg by mouth daily. 08/22/15   Historical Provider, MD  gabapentin (NEURONTIN) 300 MG capsule Take 300 mg by mouth at bedtime. 07/25/15   Historical Provider, MD  hydrochlorothiazide (MICROZIDE) 12.5 MG capsule Take 12.5 mg by mouth daily as needed (fluid).  08/31/15   Historical Provider, MD  HYDROcodone-acetaminophen (NORCO/VICODIN) 5-325 MG per tablet Take 1 tablet by mouth 2 (two) times daily as needed. 09/28/14   Aleksei Plotnikov V, MD  HYDROcodone-acetaminophen (NORCO/VICODIN) 5-325 MG tablet Take 1-2 tablets by mouth every 6 (six) hours as needed for moderate pain or severe pain. 09/12/15   Forde Dandy, MD  ibuprofen (ADVIL,MOTRIN) 200 MG tablet Take 400 mg by mouth every 6 (six) hours as needed for headache or moderate pain.    Historical Provider, MD  pantoprazole (PROTONIX) 40 MG tablet Take 1 tablet (40 mg total) by mouth daily. 09/28/14   Aleksei Plotnikov V, MD  triamcinolone cream (KENALOG) 0.5 % Apply topically 3 (three) times daily. rash 09/28/14   Cassandria Anger, MD  Vitamin D, Ergocalciferol, (DRISDOL) 50000 UNITS CAPS capsule TAKE ONE CAPSULE BY MOUTH ONCE A WEEK 11/29/14   Aleksei Plotnikov V, MD   BP 156/84 mmHg  Pulse 83  Temp(Src) 98.2 F (36.8 C) (Oral)  Resp 20  SpO2 97%  LMP 09/04/2015 Physical Exam  Constitutional:  She appears well-developed and well-nourished. No distress.  HENT:  Head: Normocephalic and atraumatic.  Neck: Neck supple.  Cardiovascular: Normal rate and regular rhythm.   Pulmonary/Chest: Effort normal and breath sounds normal. No respiratory distress. She has no wheezes. She has no rales.  Abdominal: Soft. She exhibits no distension. There is tenderness in the right upper quadrant and epigastric area. There is no rebound and no guarding.  obese  Neurological: She is alert.  Skin: She is not diaphoretic.  Nursing note and vitals reviewed.   ED Course  Procedures (including critical care time) Labs Review Labs Reviewed   COMPREHENSIVE METABOLIC PANEL - Abnormal; Notable for the following:    Glucose, Bld 101 (*)    Calcium 8.6 (*)    AST 92 (*)    All other components within normal limits  CBC WITH DIFFERENTIAL/PLATELET - Abnormal; Notable for the following:    Hemoglobin 11.5 (*)    HCT 34.3 (*)    RDW 15.9 (*)    All other components within normal limits  LIPASE, BLOOD    Imaging Review US Abdomen Limited Ruq  09/11/2015  CLINICAL DATA:  Right upper quadrant pain EXAM: US ABDOMEN LIMITED - RIGHT UPPER QUADRANT COMPARISON:  12/02/2014 FINDINGS: Gallbladder: There is a 12 mm calculus within the gallbladder lumen. There is no gallbladder mural thickening or pericholecystic fluid. The patient was not tender to probe pressure over the gallbladder. Common bile duct: Diameter: Normal, 3 mm. Liver: No focal liver lesions. No intrahepatic bile duct dilatation. Mild generalized echogenicity of hepatic parenchyma, suggesting fatty infiltration. IMPRESSION: Cholelithiasis. Probable mild fatty infiltration. No sonographic evidence of acute cholecystitis. Electronically Signed   By: Andreas Newport M.D.   On: 09/11/2015 22:38   I have personally reviewed and evaluated these images and lab results as part of my medical decision-making.   EKG Interpretation None       11:22 AM General surgery will see pt in ED.  I spoke with Coralie Keens, PA-C.   MDM   Final diagnoses:  Symptomatic cholelithiasis    Pt with RUQ abdominal pain found to have gallstones, dx with biliary colic last night.  Admitted today for uncontrolled pain.  No e/o cholecystitis, choledocholithiasis, cholangitis.  Admitted by general surgery.     Clayton Bibles, PA-C 09/12/15 [place], MD 09/12/15 [phone]

## 2015-09-12 NOTE — H&P (Signed)
[facility] Surgery Admission Note  Charlotte Meyer Aug 31, 1965  [id number].    Requesting MD: Clayton Bibles, PA-C Chief Complaint/Reason for Consult: Symptomatic cholelithiasis  HPI:  49 y/o obese female with PMH depression, HLD, OA, GERD, allergic rhinitis, lactose intolerance presents to the ED last night after acute onset of severe sharp upper abdominal pain at 7pm.  She then went to her PCP this morning at 10:00am because the pain wasn't better and they told her to come back to [facility].  The pain started in her epigastrium and radiated out to her right and left upper abdomen.  Only other complaint was fatigue.  She denied any N/V, fever/chills, changes in bowel/bladder habits, CP/SOB.  No precipitating or alleviating factors, no food triggors.  She's never had anything like this before.  No h/o abdominal surgeries.  Last meal was oatmeal between 9:30 and 10:00am.   ROS: All systems reviewed and otherwise negative except for as above  Family History  Problem Relation Age of Onset  . Cancer Father     throat  . Diabetes Other   . Cholelithiasis Other     Past Medical History  Diagnosis Date  . Obesity   . Depression     treated with zoloft in past  . Hyperlipemia     no treatment required  . OA (osteoarthritis) of knee     bilaterally   . Allergic rhinitis   . GERD (gastroesophageal reflux disease)   . Lactose intolerance     Past Surgical History  Procedure Laterality Date  . Foot surgery      Social History:  reports that she has quit smoking. She does not have any smokeless tobacco history on file. She reports that she does not drink alcohol or use illicit drugs.  Allergies:  Allergies  Allergen Reactions  . Penicillins     Yeast infections Has patient had a PCN reaction causing immediate rash, facial/tongue/throat swelling, SOB or lightheadedness with hypotension: No Has patient had a PCN reaction causing severe rash involving mucus membranes or skin necrosis:  No Has patient had a PCN reaction that required hospitalization No Has patient had a PCN reaction occurring within the last 10 years: No If all of the above answers are "NO", then may proceed with Cephalosporin use.      (Not in a hospital admission)  Blood pressure 156/84, pulse 83, temperature 98.2 F (36.8 C), temperature source Oral, resp. rate 20, last menstrual period 09/04/2015, SpO2 97 %. Physical Exam: General: Obese, pleasant, WD/WN AA female who is laying in bed in NAD HEENT: head is normocephalic, atraumatic.  Sclera are noninjected.  PERRL.  Ears and nose without any masses or lesions.  Mouth is pink and moist Heart: regular, rate, and rhythm.  No obvious murmurs, gallops, or rubs noted.  Palpable pedal pulses bilaterally Lungs: CTAB, no wheezes, rhonchi, or rales noted.  Respiratory effort nonlabored Abd: soft, distended, quite tender in the epigastrium and pain in RUQ, +BS, no masses, hernias, or organomegaly, incidental mass in subcutaneous tissue on LUQ (likely lipoma) MS: all 4 extremities are symmetrical with no cyanosis, clubbing, or edema. Skin: warm and dry with no masses, lesions, or rashes Psych: A&Ox3 with an appropriate affect.   Results for orders placed or performed during the hospital encounter of 09/12/15 (from the past 48 hour(s))  Comprehensive metabolic panel     Status: Abnormal   Collection Time: 09/12/15 11:36 AM  Result Value Ref Range   Sodium 139 135 - 145  mmol/L   Potassium 3.5 3.5 - 5.1 mmol/L   Chloride 108 101 - 111 mmol/L   CO2 24 22 - 32 mmol/L   Glucose, Bld 101 (H) 65 - 99 mg/dL   BUN 9 6 - 20 mg/dL   Creatinine, Ser 0.66 0.44 - 1.00 mg/dL   Calcium 8.6 (L) 8.9 - 10.3 mg/dL   Total Protein 6.9 6.5 - 8.1 g/dL   Albumin 3.5 3.5 - 5.0 g/dL   AST 92 (H) 15 - 41 U/L   ALT 44 14 - 54 U/L   Alkaline Phosphatase 85 38 - 126 U/L   Total Bilirubin 1.2 0.3 - 1.2 mg/dL   GFR calc non Af Amer >60 >60 mL/min   GFR calc Af Amer >60 >60 mL/min     Comment: (NOTE) The eGFR has been calculated using the CKD EPI equation. This calculation has not been validated in all clinical situations. eGFR's persistently <60 mL/min signify possible Chronic Kidney Disease.    Anion gap 7 5 - 15  Lipase, blood     Status: None   Collection Time: 09/12/15 11:36 AM  Result Value Ref Range   Lipase 26 11 - 51 U/L  CBC with Differential     Status: Abnormal   Collection Time: 09/12/15 11:36 AM  Result Value Ref Range   WBC 8.1 4.0 - 10.5 K/uL   RBC 3.98 3.87 - 5.11 MIL/uL   Hemoglobin 11.5 (L) 12.0 - 15.0 g/dL   HCT 34.3 (L) 36.0 - 46.0 %   MCV 86.2 78.0 - 100.0 fL   MCH 28.9 26.0 - 34.0 pg   MCHC 33.5 30.0 - 36.0 g/dL   RDW 15.9 (H) 11.5 - 15.5 %   Platelets 223 150 - 400 K/uL   Neutrophils Relative % 72 %   Neutro Abs 5.8 1.7 - 7.7 K/uL   Lymphocytes Relative 19 %   Lymphs Abs 1.5 0.7 - 4.0 K/uL   Monocytes Relative 9 %   Monocytes Absolute 0.7 0.1 - 1.0 K/uL   Eosinophils Relative 0 %   Eosinophils Absolute 0.0 0.0 - 0.7 K/uL   Basophils Relative 0 %   Basophils Absolute 0.0 0.0 - 0.1 K/uL   US Abdomen Limited Ruq  09/11/2015  CLINICAL DATA:  Right upper quadrant pain EXAM: US ABDOMEN LIMITED - RIGHT UPPER QUADRANT COMPARISON:  12/02/2014 FINDINGS: Gallbladder: There is a 12 mm calculus within the gallbladder lumen. There is no gallbladder mural thickening or pericholecystic fluid. The patient was not tender to probe pressure over the gallbladder. Common bile duct: Diameter: Normal, 3 mm. Liver: No focal liver lesions. No intrahepatic bile duct dilatation. Mild generalized echogenicity of hepatic parenchyma, suggesting fatty infiltration. IMPRESSION: Cholelithiasis. Probable mild fatty infiltration. No sonographic evidence of acute cholecystitis. Electronically Signed   By: Andreas Newport M.D.   On: 09/11/2015 22:38      Assessment/Plan Symptomatic cholelithiasis Obesity  HLD GERD HTN - patient thinks this was due to inaccurate  cuff size and hasn't been taking BP meds  Plan: 1.  WBC normal, and LFT's are normal except for AST (92).  US shows gallstones without acute cholecystitis.  Admit to [facility], lap chole with IOC, this afternoon if possible 2.  NPO, bowel rest, IVF, pain control, antiemetics, antibiotics (rocephin) 3.  Discussed operative risks/benfits including risks of bleeding, infection, bile leak, need for drain placement, conversion to open, need for ERCP, and injury to other intra-abdominal structures.  She understands the risks and would like to  proceed with surgical intervention.   4.  Discussed post-operative management and requirements for discharge home, usually 24 hours after surgery   Nat Christen, [facility] 09/12/2015, 12:41 PM Pager: [phone] (7am - 4:30pm M-F; 7am - 11:30am Sa/Su)

## 2015-09-12 NOTE — Progress Notes (Signed)
Pt very uncomfortable on stretcher, has chronic back pain and cannot reposition.  VSS, all requirements met for discharge from PACU.  

## 2015-09-12 NOTE — ED Notes (Signed)
TRANSFER TO OR

## 2015-09-12 NOTE — ED Notes (Signed)
SURGICAL PA at bedside.

## 2015-09-13 MED ORDER — METHOCARBAMOL 500 MG PO TABS: 1000.0000 mg | ORAL_TABLET | Freq: Three times a day (TID) | ORAL | Status: DC | PRN

## 2015-09-13 MED ORDER — LIP MEDEX EX OINT
TOPICAL_OINTMENT | CUTANEOUS | Status: AC
  Filled 2015-09-13: qty 7

## 2015-09-13 MED ORDER — OXYCODONE-ACETAMINOPHEN 5-325 MG PO TABS: 1.0000 | ORAL_TABLET | Freq: Four times a day (QID) | ORAL | Status: DC | PRN

## 2015-09-13 MED ORDER — VITAMINS A & D EX OINT
TOPICAL_OINTMENT | CUTANEOUS | Status: AC
  Filled 2015-09-13: qty 5

## 2015-09-13 MED ORDER — OXYCODONE-ACETAMINOPHEN 5-325 MG PO TABS
1.0000 | ORAL_TABLET | ORAL | Status: DC | PRN
  Administered 2015-09-13: 2 via ORAL
  Filled 2015-09-13: qty 2

## 2015-09-13 NOTE — Progress Notes (Signed)
Explained to patient she could be discharged after ambulating in the hall. Patient stated she didn't want to get up. told patient I would give her some time and check back with her about ambulating. Urban Gibson Lexine Baton

## 2015-09-13 NOTE — Discharge Summary (Signed)
[name] Discharge Summary   Patient ID: Charlotte Meyer MRN: [id number] DOB/AGE: April 26, 1966 50 y.o.  Admit date: 09/12/2015 Discharge date: 09/13/2015  Admitting Diagnosis: Symptomatic cholelithiasis  Discharge Diagnosis Patient Active Problem List   Diagnosis Date Noted  . Symptomatic cholelithiasis 09/12/2015  . Cystitis 09/29/2014  . Vitamin D deficiency 09/29/2014  . Pain in joint, shoulder region 08/13/2013  . Chronic venous insufficiency 11/06/2012  . Stasis dermatitis of both legs 11/06/2012  . LBP (low back pain) 11/06/2012  . Vitamin B 12 deficiency 08/05/2012  . Fatigue 08/05/2012  . Well adult exam 01/30/2011  . Anemia 01/30/2011  . ABDOMINAL PAIN-RUQ 12/14/2009  . ABNORMAL EXAM-BILIARY TRACT 12/14/2009  . LACTOSE INTOLERANCE 10/31/2009  . ABDOMINAL PAIN, EPIGASTRIC 10/31/2009  . ELBOW PAIN 03/31/2009  . TOBACCO USE, QUIT 03/31/2009  . SWELLING MASS OR LUMP IN HEAD AND NECK 01/29/2008  . SINUSITIS- ACUTE-NOS 01/08/2008  . KNEE PAIN 06/24/2007  . ANXIETY 03/11/2007  . PARESTHESIA 03/11/2007  . ALLERGIC RHINITIS 01/03/2007  . GERD 01/03/2007  . Dyslipidemia 06/25/2006  . Obesity 06/25/2006  . DEPRESSION 06/25/2006  . OSTEOARTHRITIS 06/25/2006    Consultants None  Imaging: Dg Cholangiogram Operative  09/12/2015  CLINICAL DATA:  History of gallstones. Laparoscopic cholecystectomy. EXAM: INTRAOPERATIVE CHOLANGIOGRAM FLUOROSCOPY TIME:  11.4 seconds (11.1 mGy) COMPARISON:  Abdominal ultrasound - 09/11/2015 FINDINGS: Intraoperative cholangiographic images of the right upper abdominal quadrant during laparoscopic cholecystectomy are provided for review. Surgical clips overlie the expected location of the gallbladder fossa. Contrast injection demonstrates selective cannulation of the central aspect of the cystic duct. There is passage of contrast through the central aspect of the cystic duct with filling of a non dilated common bile duct. There is  passage of contrast though the CBD and into the descending portion of the duodenum. There is minimal reflux of injected contrast into the common hepatic duct and central aspect of the non dilated intrahepatic biliary system. There is minimal opacification the central aspect of the pancreatic duct which appears nondilated. There are no discrete filling defects within the opacified portions of the biliary system to suggest the presence of choledocholithiasis. IMPRESSION: No evidence of choledocholithiasis. Electronically Signed   By: Sandi Mariscal M.D.   On: 09/12/2015 16:46   US Abdomen Limited Ruq  09/11/2015  CLINICAL DATA:  Right upper quadrant pain EXAM: US ABDOMEN LIMITED - RIGHT UPPER QUADRANT COMPARISON:  12/02/2014 FINDINGS: Gallbladder: There is a 12 mm calculus within the gallbladder lumen. There is no gallbladder mural thickening or pericholecystic fluid. The patient was not tender to probe pressure over the gallbladder. Common bile duct: Diameter: Normal, 3 mm. Liver: No focal liver lesions. No intrahepatic bile duct dilatation. Mild generalized echogenicity of hepatic parenchyma, suggesting fatty infiltration. IMPRESSION: Cholelithiasis. Probable mild fatty infiltration. No sonographic evidence of acute cholecystitis. Electronically Signed   By: Andreas Newport M.D.   On: 09/11/2015 22:38    Procedures Dr. Zella Richer (09/12/15) - Laparoscopic Cholecystectomy with [facility]  Hospital Course:  50 y/o obese female with PMH depression, HLD, OA, GERD, allergic rhinitis, lactose intolerance presents to the ED last night after acute onset of severe sharp upper abdominal pain at 7pm. She then went to her PCP this morning at 10:00am because the pain wasn't better and they told her to come back to [facility]. The pain started in her epigastrium and radiated out to her right and left upper abdomen. Only other complaint was fatigue. She denied any N/V, fever/chills, changes in bowel/bladder habits, CP/SOB. No  precipitating or alleviating factors, no food triggors. She's never had anything like this before. No h/o abdominal surgeries.   Workup showed symptomatic cholelithiasis with normal LFT's and WBC except for mild elevation in AST.  Patient was admitted and underwent procedure listed above.  Tolerated procedure well and was transferred to the floor.  Diet was advanced as tolerated.  On POD #1, the patient was voiding well, tolerating diet, ambulating well, pain well controlled, vital signs stable, incisions c/d/i and felt stable for discharge home.  Patient will follow up in our office in 3 weeks and knows to call with questions or concerns.    Physical Exam: General:  Alert, NAD, pleasant, comfortable Abd:  Obese, soft, ND, mild tenderness, incisions C/D/I    Medication List    TAKE these medications        fexofenadine-pseudoephedrine 180-240 MG 24 hr tablet  Commonly known as:  ALLEGRA-D 24  Take 1 tablet by mouth daily as needed.     folic acid 1 MG tablet  Commonly known as:  FOLVITE  Take 1 mg by mouth daily.     gabapentin 300 MG capsule  Commonly known as:  NEURONTIN  Take 300 mg by mouth at bedtime.     hydrochlorothiazide 12.5 MG capsule  Commonly known as:  MICROZIDE  Take 12.5 mg by mouth daily as needed (fluid).     HYDROcodone-acetaminophen 5-325 MG tablet  Commonly known as:  NORCO/VICODIN  Take 1-2 tablets by mouth every 6 (six) hours as needed for moderate pain or severe pain.     ibuprofen 200 MG tablet  Commonly known as:  ADVIL,MOTRIN  Take 400 mg by mouth every 6 (six) hours as needed for headache or moderate pain.     methocarbamol 500 MG tablet  Commonly known as:  ROBAXIN  Take 2 tablets (1,000 mg total) by mouth every 8 (eight) hours as needed for muscle spasms.     oxyCODONE-acetaminophen 5-325 MG tablet  Commonly known as:  PERCOCET/ROXICET  Take 1-2 tablets by mouth every 6 (six) hours as needed for moderate pain or severe pain.      pantoprazole 40 MG tablet  Commonly known as:  PROTONIX  Take 1 tablet (40 mg total) by mouth daily.     triamcinolone cream 0.5 %  Commonly known as:  KENALOG  Apply topically 3 (three) times daily. rash     Vitamin D (Ergocalciferol) 50000 units Caps capsule  Commonly known as:  DRISDOL  TAKE ONE CAPSULE BY MOUTH ONCE A WEEK         Follow-up Information    Follow up with medicaid [place] access covered patient.   WhyKathleen Argue [phone] [address] [place], [place] [address] [email or web address] Use this website to assist with understanding your coverage & to renew application   Contact information:   As a Medicaid client you MUST contact DSS/SSI each time you change address, move to another [place] or another state to keep your address updated  [place] to Dr appts if you are have full Medicaid: [phone], [phone]      Follow up with [facility]. Go on 10/11/2015.   Specialty:  General Surgery   Why:  For post-operation check at 8:30am.  Please arrive at least 47min before your appointment to complete your check in paperwork.  If you are unable to arrive 23min prior to your appointment time we may have to cancel or reschedule you.   Contact  information:   [address] [place] [phone]      Signed: [facility] [phone]  09/13/2015, 8:21 AM

## 2015-09-13 NOTE — Progress Notes (Signed)
Patient and husband verbalized understanding of discharge instructions. Patient taken downstairs via wheelchair.

## 2015-09-13 NOTE — Discharge Instructions (Signed)

## 2015-10-27 ENCOUNTER — Ambulatory Visit: Payer: Medicaid Other

## 2015-11-03 ENCOUNTER — Ambulatory Visit
Admission: RE | Admit: 2015-11-03 | Discharge: 2015-11-03 | Disposition: A | Discharge status: Home / Self Care | Payer: Medicaid Other | Source: Ambulatory Visit | Attending: Physician Assistant | Admitting: Physician Assistant

## 2015-11-03 DIAGNOSIS — Z1231 Encounter for screening mammogram for malignant neoplasm of breast: Secondary | ICD-10-CM

## 2015-11-03 IMAGING — MG MM DIGITAL SCREENING BILAT
7 series · 7 of 7 positions shown · non-contrast
Comparison: Previous exam(s).

CLINICAL DATA: Screening.

EXAM:
DIGITAL SCREENING BILATERAL MAMMOGRAM WITH CAD

[L MLO (1 of 2)]
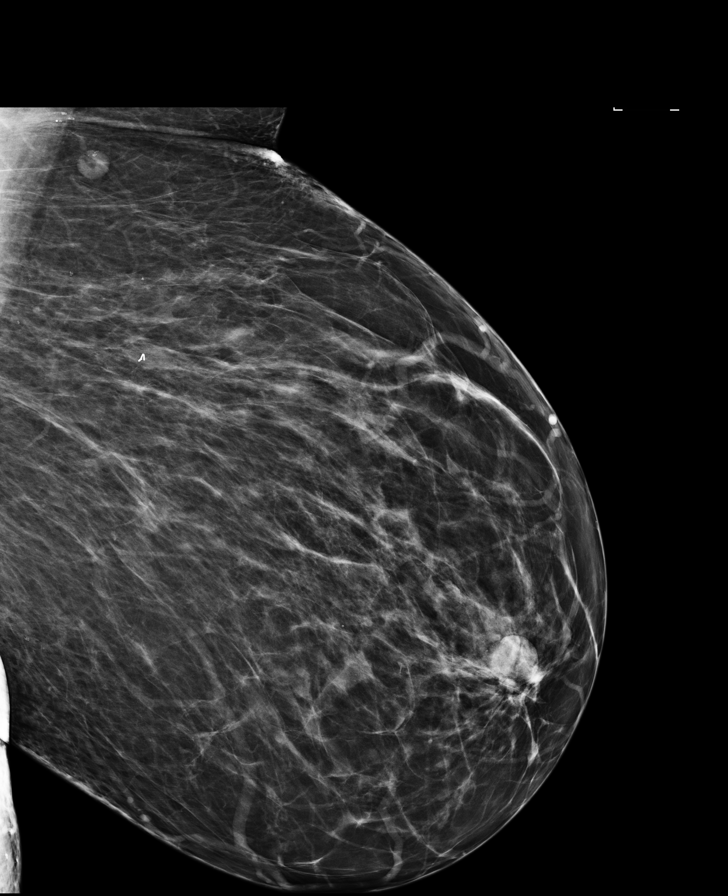

[R CC (1 of 2)]
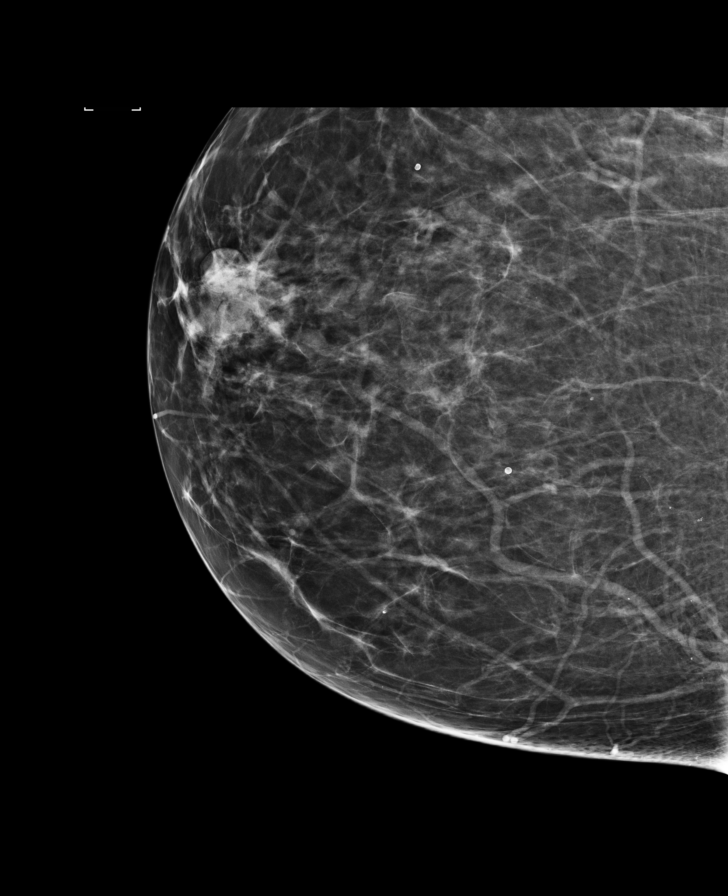

[R CC (2 of 2)]
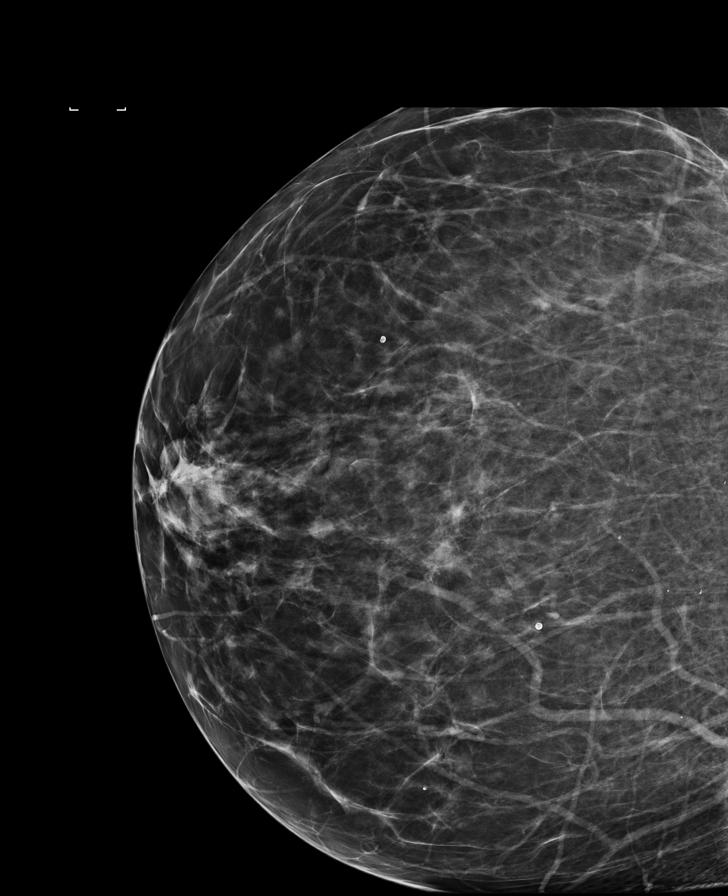

[R MLO (1 of 2)]
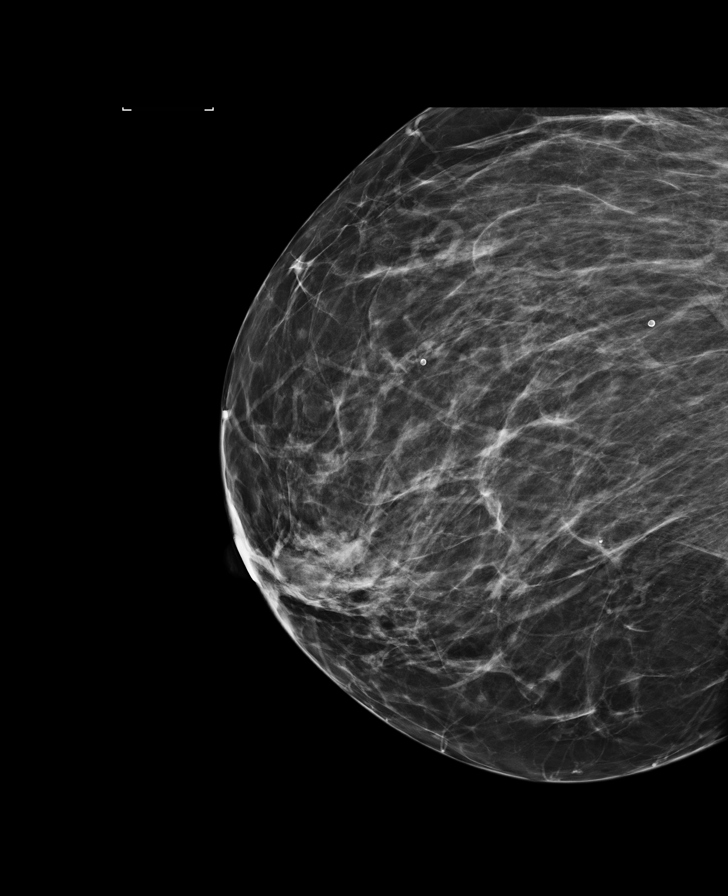

[R MLO (2 of 2)]
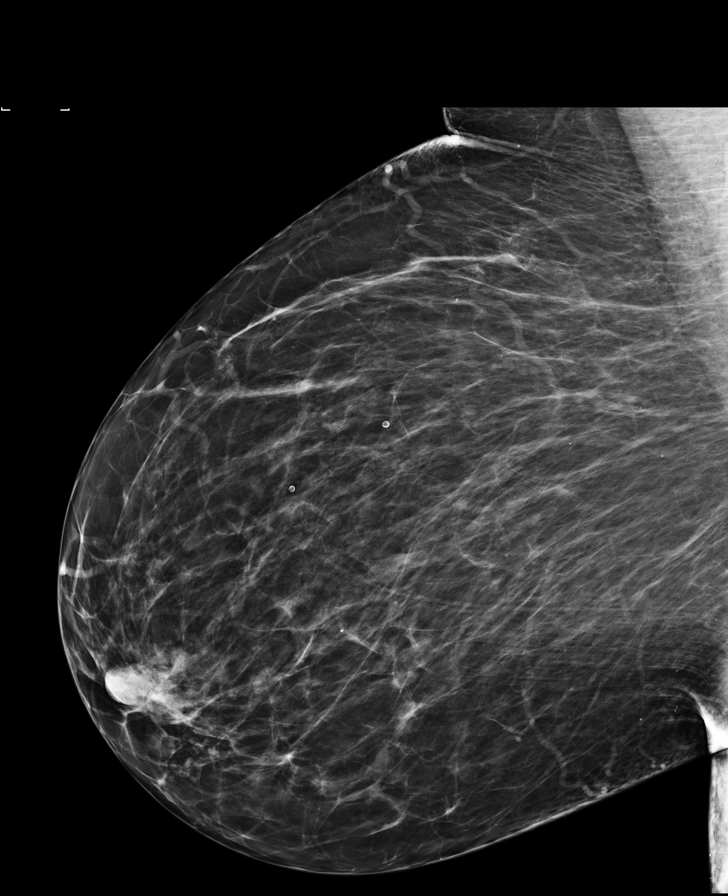

[L CC]
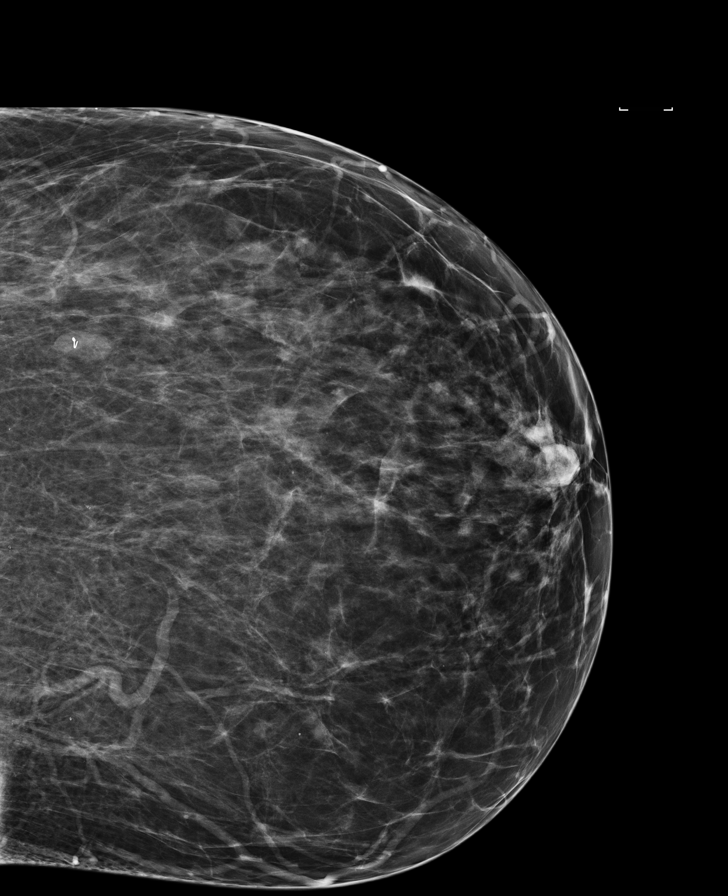

[L MLO (2 of 2)]
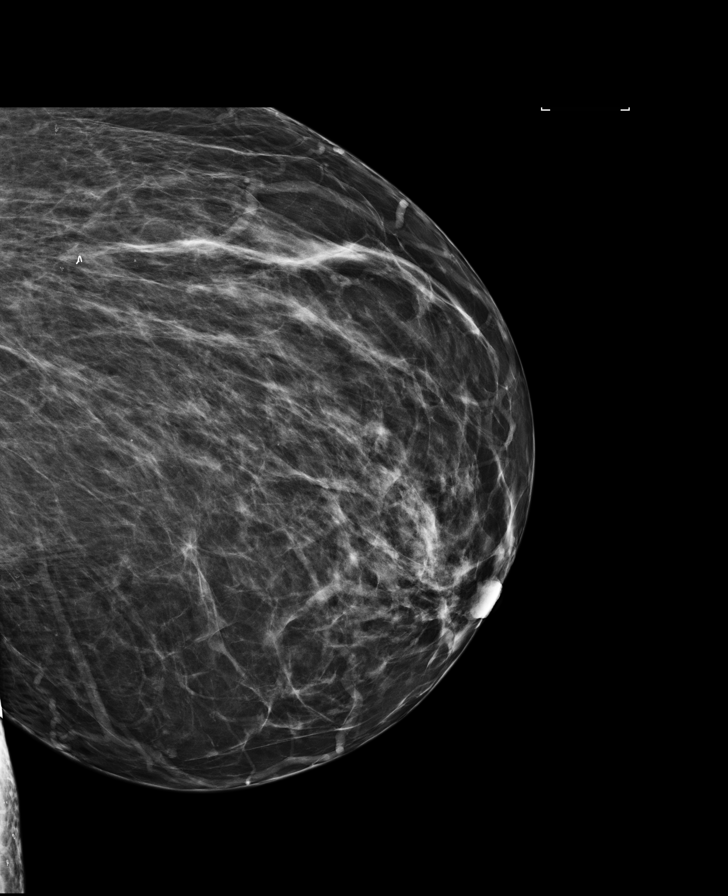

[7 of 7 positions shown; findings below may reference images not displayed]

ACR Breast Density Category b: There are scattered areas of
fibroglandular density.
FINDINGS: There are no findings suspicious for malignancy. Images were
processed with CAD.
IMPRESSION: No mammographic evidence of malignancy. A result letter of this
screening mammogram will be mailed directly to the patient.

RECOMMENDATION:
Screening mammogram in one year. (Code:[US])

BI-RADS CATEGORY  1: Negative.

## 2016-01-08 ENCOUNTER — Other Ambulatory Visit: Payer: Self-pay | Admitting: Internal Medicine

## 2016-01-08 DIAGNOSIS — R1902 Left upper quadrant abdominal swelling, mass and lump: Secondary | ICD-10-CM

## 2016-01-19 ENCOUNTER — Ambulatory Visit
Admission: RE | Admit: 2016-01-19 | Discharge: 2016-01-19 | Disposition: A | Discharge status: Home / Self Care | Payer: Medicaid Other | Source: Ambulatory Visit | Attending: Internal Medicine | Admitting: Internal Medicine

## 2016-01-19 DIAGNOSIS — R1902 Left upper quadrant abdominal swelling, mass and lump: Secondary | ICD-10-CM

## 2016-01-19 IMAGING — US US ABDOMEN COMPLETE
1 series · 13 of 25 positions shown · non-contrast
Comparison: Right upper quadrant ultrasound [DATE]

CLINICAL DATA: Left upper quadrant abdominal mass

EXAM:
ABDOMEN ULTRASOUND COMPLETE

[Series 1: us abdomen complete · 0.35mm/px · 13 of 68 slices shown]
[im 1/68]
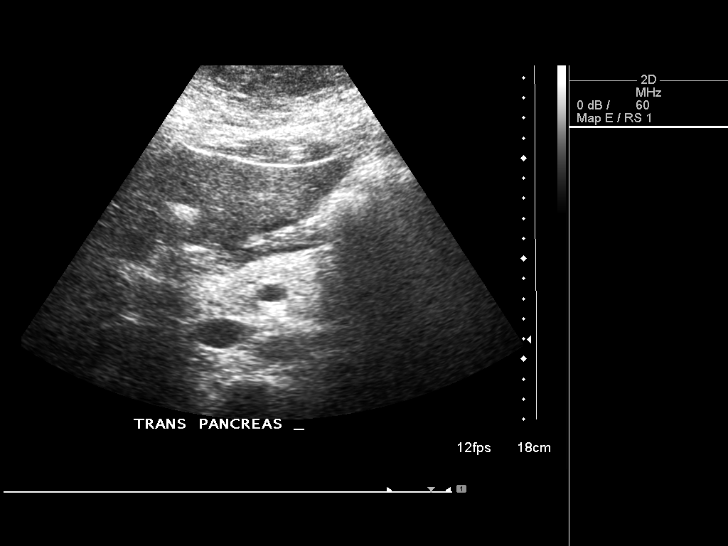
[im 6/68]
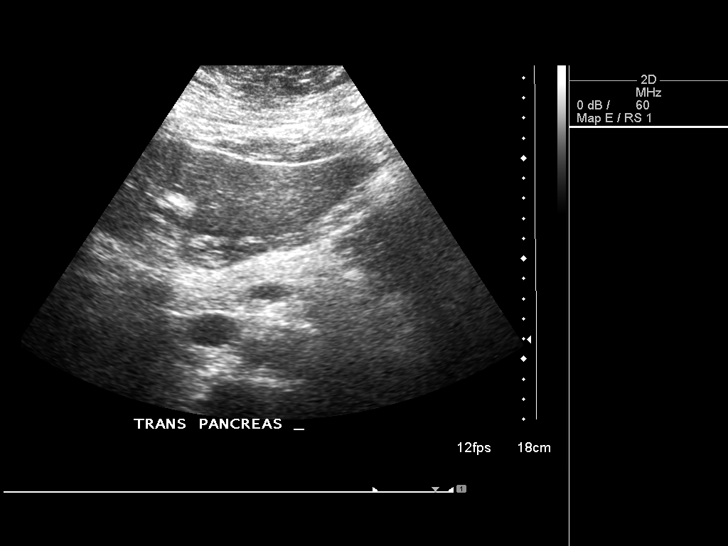
[im 12/68]
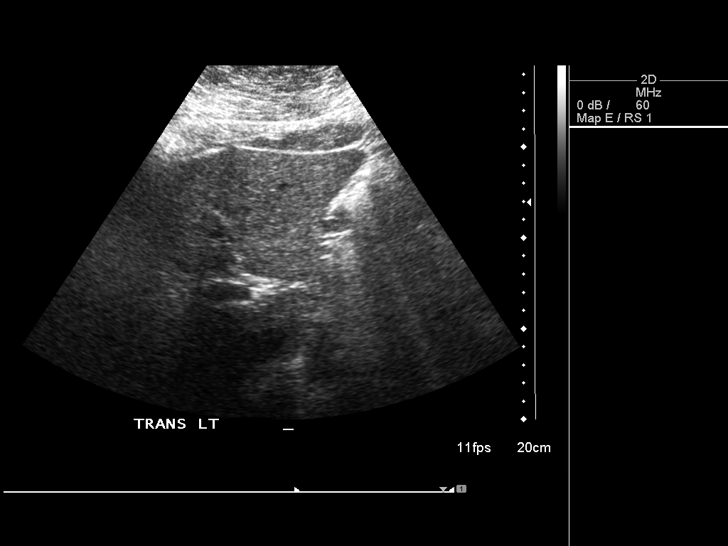
[im 17/68]
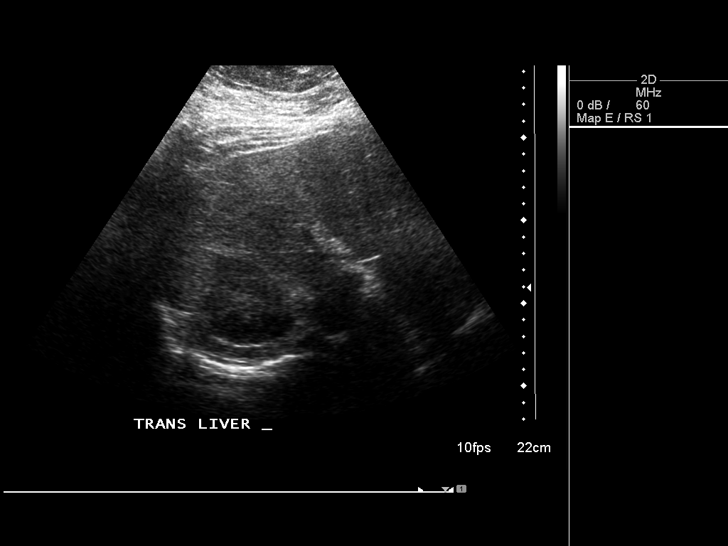
[im 23/68]
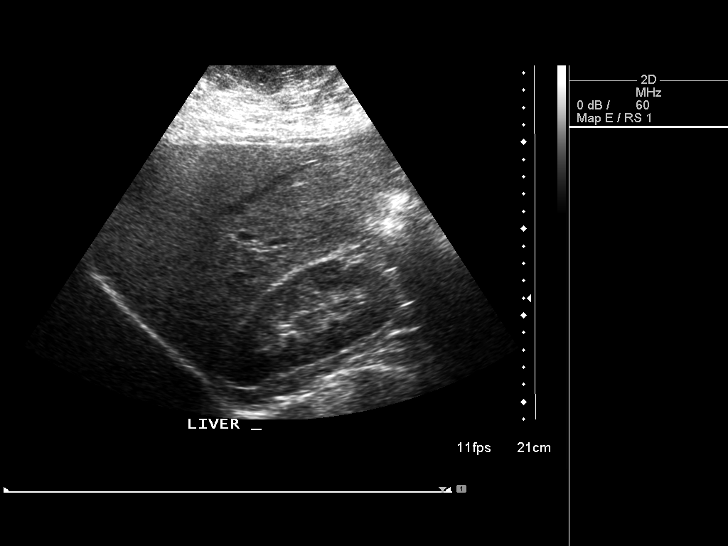
[im 28/68]
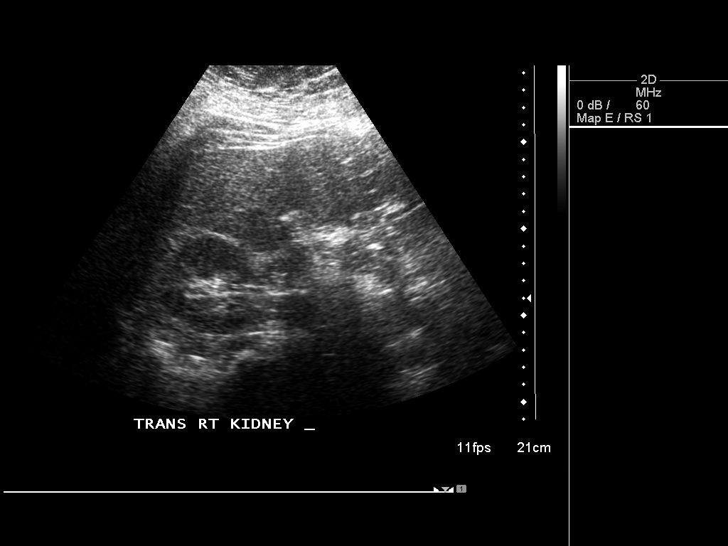
[im 34/68]
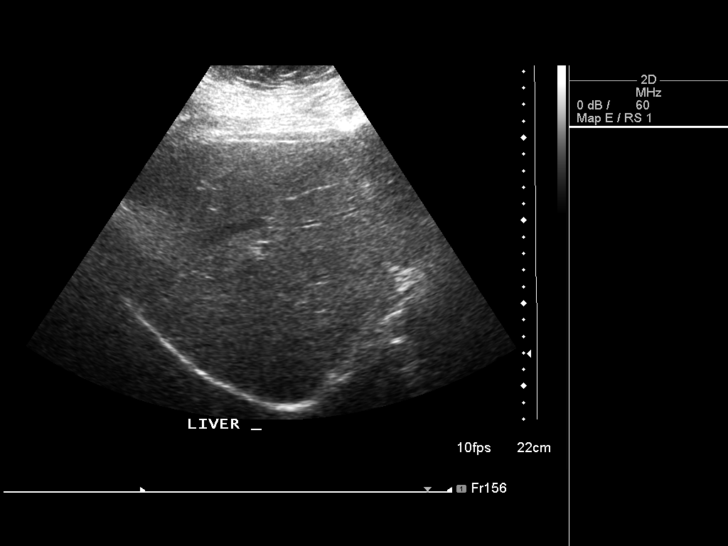
[im 40/68]
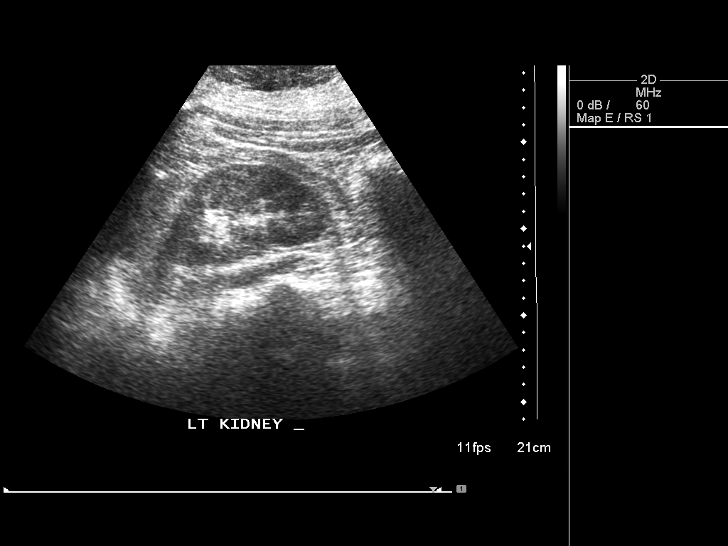
[im 45/68]
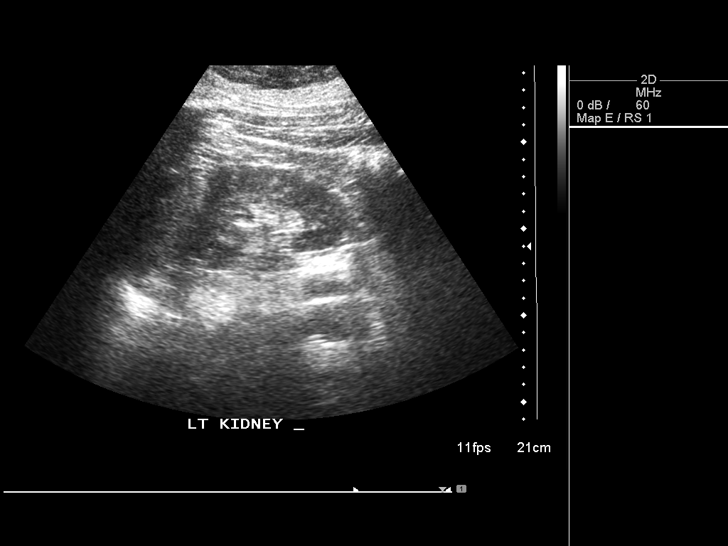
[im 51/68]
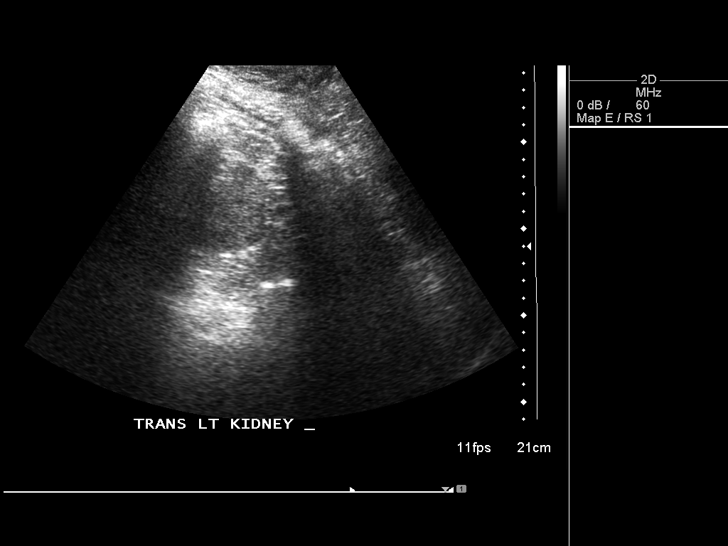
[im 56/68]
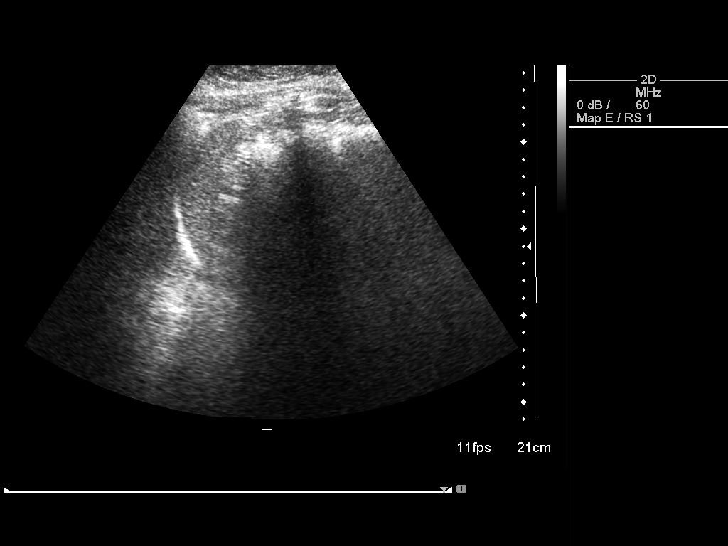
[im 62/68]
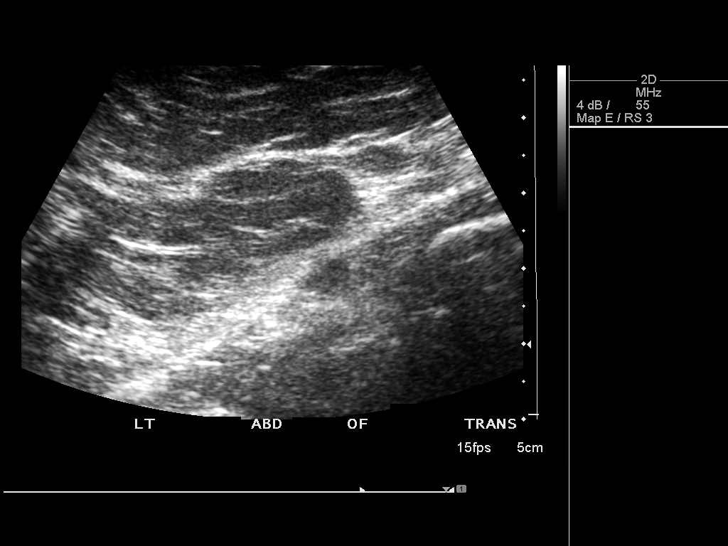
[im 68/68]
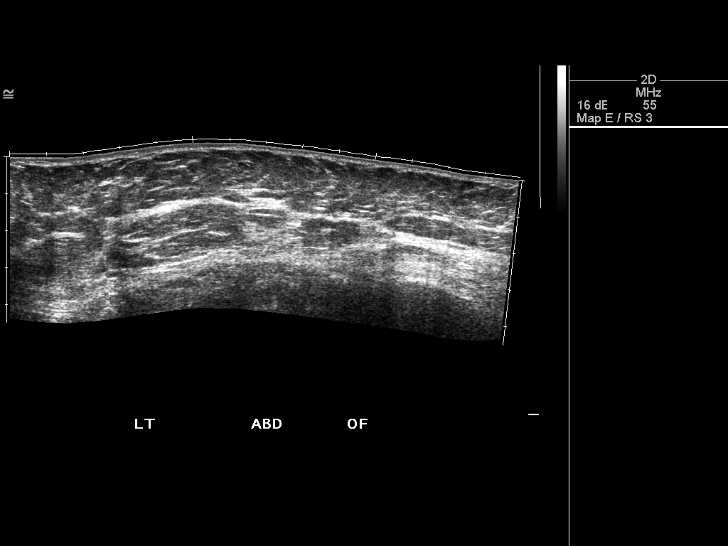

[13 of 25 positions shown; findings below may reference images not displayed]

FINDINGS: Gallbladder: The gallbladder is surgically absent.

Common bile duct: Diameter: 5.1 mm

Liver: No focal lesion identified. Within normal limits in
parenchymal echogenicity.

IVC: No abnormality visualized.

Pancreas: Visualized portion unremarkable.

Spleen: The spleen is normal in echotexture and size.

Right Kidney: Length: 11.7 cm. Echogenicity within normal limits. No
mass or hydronephrosis visualized.

Left Kidney: Length: 10.4 cm. Echogenicity within normal limits. No
mass or hydronephrosis visualized.

Abdominal aorta: Bowel gas limits evaluation of the abdominal aorta.

Other findings: The patient notes tenderness in the left upper
quadrant where the tissue is reported to be somewhat lumpy to
palpation. The patient is obese.
IMPRESSION: No sonographic abnormality in the left upper quadrant of the
abdominal cavity is observed. The patient does report tenderness to
palpation and there is heterogeneous echotexture of the subcutaneous
fat in the left upper quadrant.

The patient has undergone cholecystectomy since [DATE]. No acute
hepatobiliary abnormality is observed.

If the patient's symptoms persist and remain unexplained, abdominal
CT scanning may be the most useful next imaging step.

## 2016-01-29 ENCOUNTER — Encounter (HOSPITAL_COMMUNITY): Payer: Self-pay | Admitting: *Deleted

## 2016-02-05 NOTE — Progress Notes (Signed)
Attempted to call pt x 6 times no response for pre op medical history and instructions.

## 2016-02-06 ENCOUNTER — Ambulatory Visit (HOSPITAL_COMMUNITY): Admission: RE | Admit: 2016-02-06 | Payer: Medicaid Other | Source: Ambulatory Visit

## 2016-02-06 ENCOUNTER — Encounter (HOSPITAL_COMMUNITY): Payer: Self-pay | Admitting: Anesthesiology

## 2016-02-06 ENCOUNTER — Encounter (HOSPITAL_COMMUNITY): Admission: RE | Payer: Self-pay | Source: Ambulatory Visit

## 2016-02-06 SURGERY — COLONOSCOPY WITH PROPOFOL
Anesthesia: Monitor Anesthesia Care

## 2016-02-06 NOTE — Anesthesia Preprocedure Evaluation (Deleted)
Anesthesia Evaluation  Patient identified by MRN, date of birth, ID band Patient awake    Reviewed: Allergy & Precautions, H&P , NPO status , Patient's Chart, lab work & pertinent test results  Airway Mallampati: III  TM Distance: >3 FB Neck ROM: Full    Dental no notable dental hx. (+) Teeth Intact, Dental Advisory Given   Pulmonary former smoker,    Pulmonary exam normal breath sounds clear to auscultation       Cardiovascular negative cardio ROS   Rhythm:Regular Rate:Normal     Neuro/Psych PSYCHIATRIC DISORDERS Anxiety Depression negative neurological ROS     GI/Hepatic Neg liver ROS, GERD  Medicated,  Endo/Other  Morbid obesitySuper morbid obesity  Renal/GU negative Renal ROS  negative genitourinary   Musculoskeletal  (+) Arthritis , Osteoarthritis,    Abdominal   Peds  Hematology negative hematology ROS (+)   Anesthesia Other Findings   Reproductive/Obstetrics negative OB ROS                             Anesthesia Physical  Anesthesia Plan  ASA: III  Anesthesia Plan: MAC   Post-op Pain Management:    Induction: Intravenous  Airway Management Planned:   Additional Equipment:   Intra-op Plan:   Post-operative Plan:   Informed Consent: I have reviewed the patients History and Physical, chart, labs and discussed the procedure including the risks, benefits and alternatives for the proposed anesthesia with the patient or authorized representative who has indicated his/her understanding and acceptance.   Dental advisory given  Plan Discussed with: CRNA  Anesthesia Plan Comments:         Anesthesia Quick Evaluation

## 2016-02-24 ENCOUNTER — Encounter (HOSPITAL_COMMUNITY): Payer: Self-pay | Admitting: *Deleted

## 2016-02-24 ENCOUNTER — Ambulatory Visit (INDEPENDENT_AMBULATORY_CARE_PROVIDER_SITE_OTHER): Payer: Medicaid Other

## 2016-02-24 ENCOUNTER — Ambulatory Visit (HOSPITAL_COMMUNITY)
Admission: EM | Admit: 2016-02-24 | Discharge: 2016-02-24 | Disposition: A | Discharge status: Home / Self Care | Payer: Medicaid Other | Attending: Family Medicine | Admitting: Family Medicine

## 2016-02-24 DIAGNOSIS — K219 Gastro-esophageal reflux disease without esophagitis: Secondary | ICD-10-CM | POA: Diagnosis not present

## 2016-02-24 DIAGNOSIS — E538 Deficiency of other specified B group vitamins: Secondary | ICD-10-CM | POA: Insufficient documentation

## 2016-02-24 DIAGNOSIS — E739 Lactose intolerance, unspecified: Secondary | ICD-10-CM | POA: Insufficient documentation

## 2016-02-24 DIAGNOSIS — E559 Vitamin D deficiency, unspecified: Secondary | ICD-10-CM | POA: Insufficient documentation

## 2016-02-24 DIAGNOSIS — Z87891 Personal history of nicotine dependence: Secondary | ICD-10-CM | POA: Diagnosis not present

## 2016-02-24 DIAGNOSIS — I872 Venous insufficiency (chronic) (peripheral): Secondary | ICD-10-CM | POA: Diagnosis not present

## 2016-02-24 DIAGNOSIS — E785 Hyperlipidemia, unspecified: Secondary | ICD-10-CM | POA: Insufficient documentation

## 2016-02-24 DIAGNOSIS — R05 Cough: Secondary | ICD-10-CM | POA: Insufficient documentation

## 2016-02-24 DIAGNOSIS — F329 Major depressive disorder, single episode, unspecified: Secondary | ICD-10-CM | POA: Insufficient documentation

## 2016-02-24 DIAGNOSIS — M545 Low back pain: Secondary | ICD-10-CM | POA: Insufficient documentation

## 2016-02-24 DIAGNOSIS — E669 Obesity, unspecified: Secondary | ICD-10-CM | POA: Insufficient documentation

## 2016-02-24 DIAGNOSIS — K8020 Calculus of gallbladder without cholecystitis without obstruction: Secondary | ICD-10-CM | POA: Diagnosis not present

## 2016-02-24 DIAGNOSIS — J069 Acute upper respiratory infection, unspecified: Secondary | ICD-10-CM

## 2016-02-24 DIAGNOSIS — D649 Anemia, unspecified: Secondary | ICD-10-CM | POA: Insufficient documentation

## 2016-02-24 DIAGNOSIS — R5383 Other fatigue: Secondary | ICD-10-CM | POA: Insufficient documentation

## 2016-02-24 DIAGNOSIS — Z880 Allergy status to penicillin: Secondary | ICD-10-CM | POA: Diagnosis not present

## 2016-02-24 DIAGNOSIS — F419 Anxiety disorder, unspecified: Secondary | ICD-10-CM | POA: Diagnosis not present

## 2016-02-24 DIAGNOSIS — Z9049 Acquired absence of other specified parts of digestive tract: Secondary | ICD-10-CM | POA: Diagnosis not present

## 2016-02-24 DIAGNOSIS — J309 Allergic rhinitis, unspecified: Secondary | ICD-10-CM | POA: Insufficient documentation

## 2016-02-24 LAB — POCT RAPID STREP A: Streptococcus, Group A Screen (Direct): NEGATIVE

## 2016-02-24 IMAGING — DX DG CHEST 2V
2 series · 2 of 2 positions shown · non-contrast
Comparison: None.

CLINICAL DATA: Per pt: sick for almost a week, cough, head
congestion, sneezing, low grade fever, body aches, sore throat, eyes
hurting. Non-smoker. No history of cardiac disease. History of
Bronchitis. Patient is not a diabetic.

EXAM:
CHEST  2 VIEW

[chest pa]
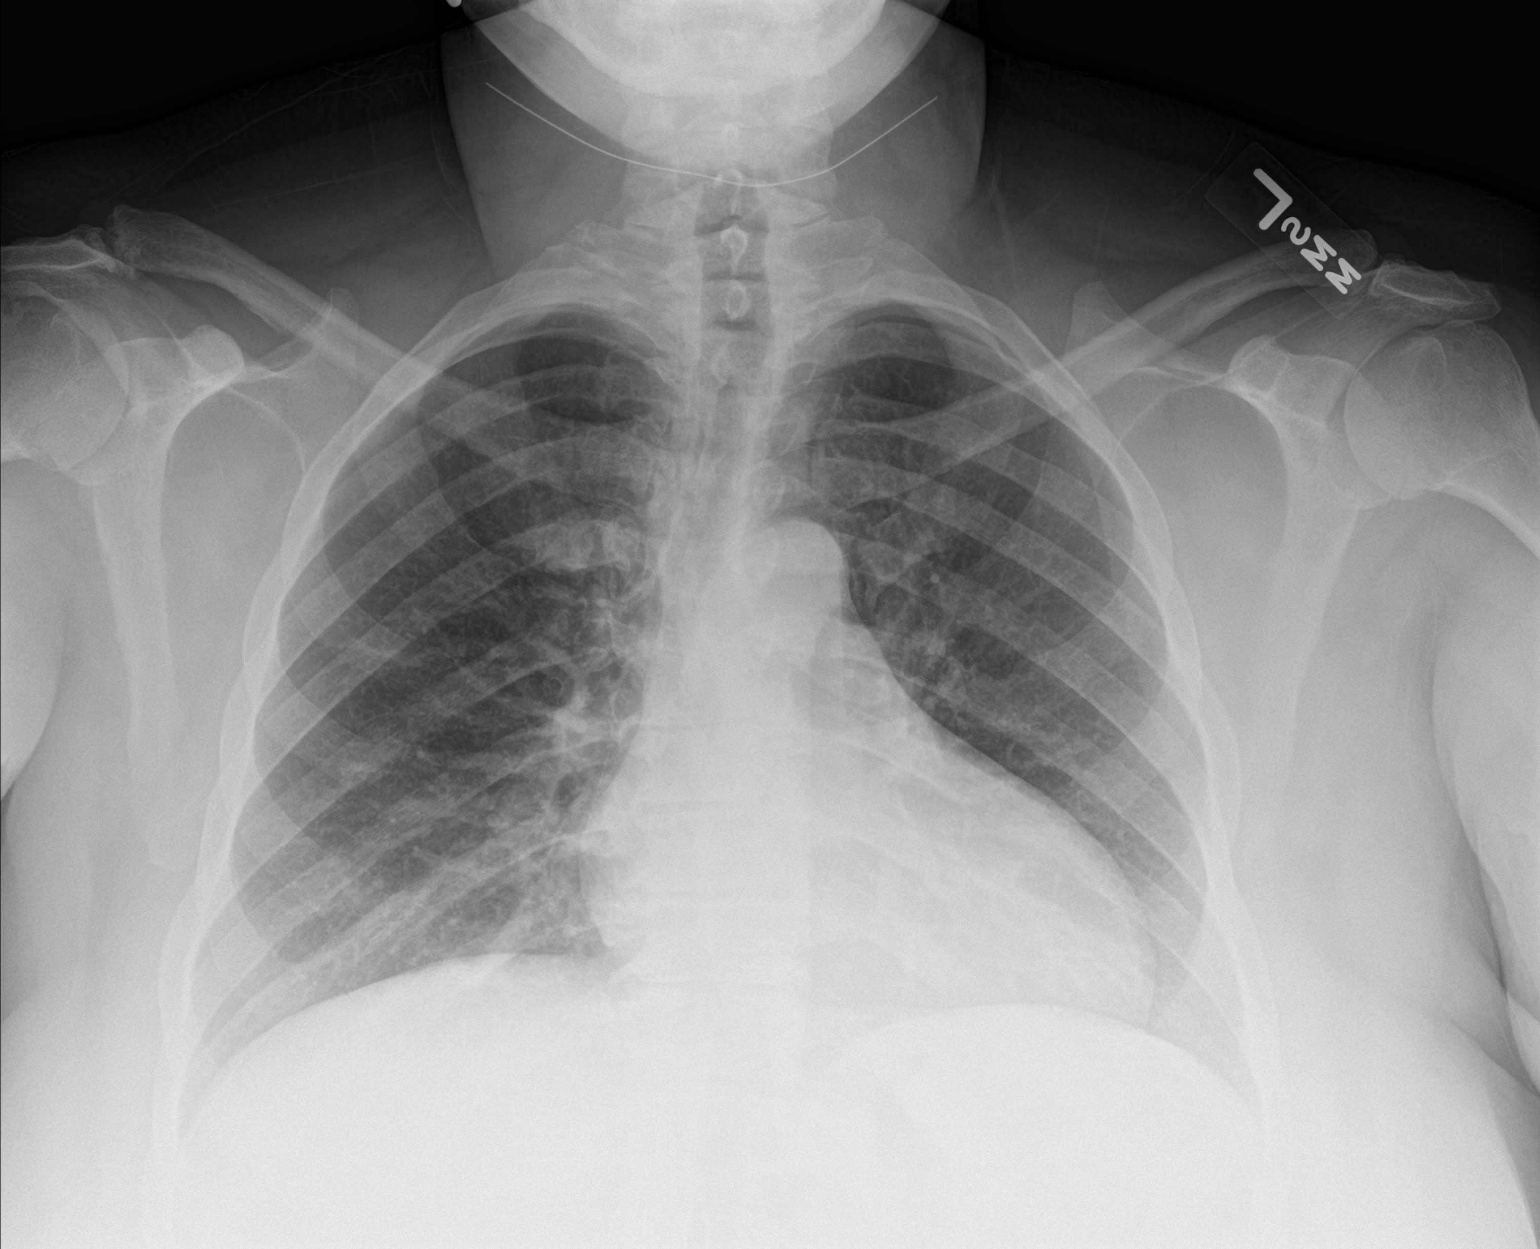

[chest lat]
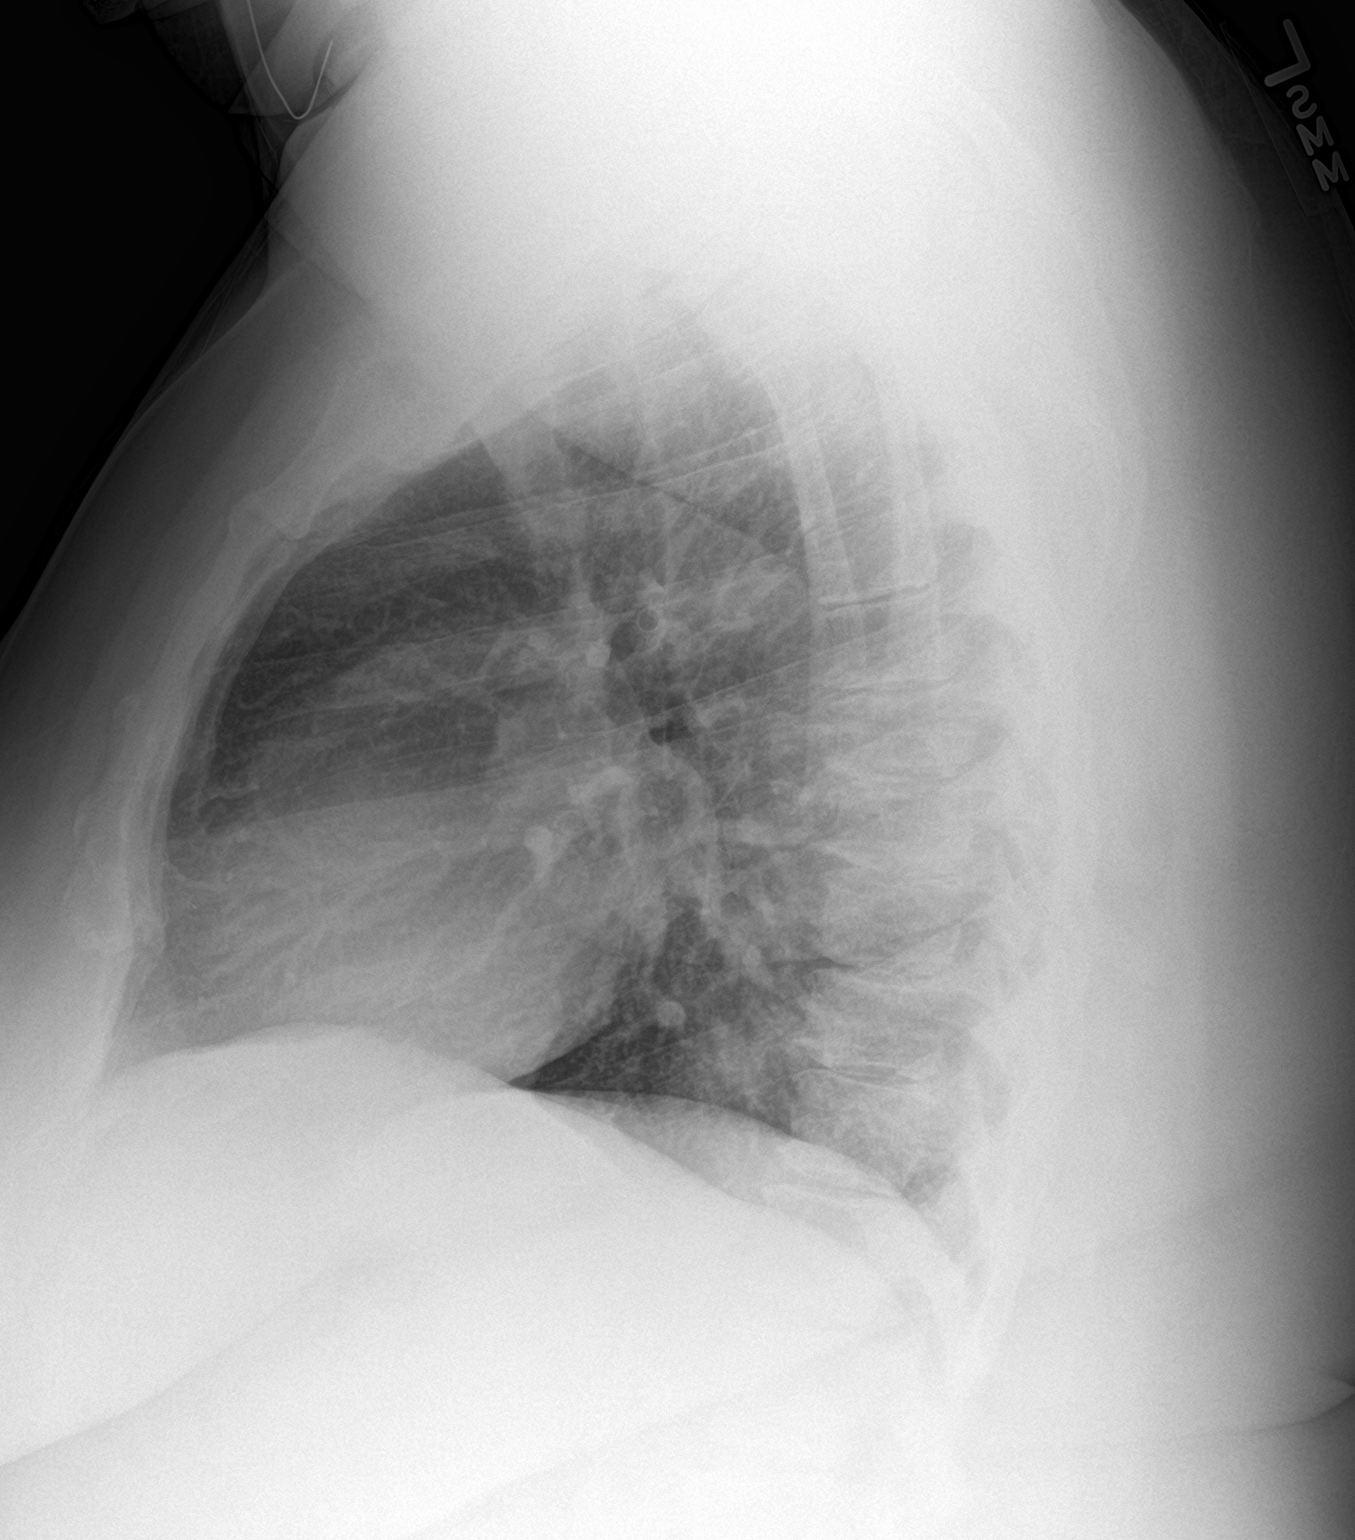

[2 of 2 positions shown; findings below may reference images not displayed]

FINDINGS: The heart size and mediastinal contours are within normal limits.
Both lungs are clear. No pleural effusion or pneumothorax. The
visualized skeletal structures are unremarkable.
IMPRESSION: No active cardiopulmonary disease.

## 2016-02-24 MED ORDER — IPRATROPIUM BROMIDE 0.06 % NA SOLN: 2.0000 | Freq: Four times a day (QID) | NASAL | 1 refills | Status: DC

## 2016-02-24 MED ORDER — GUAIFENESIN-CODEINE 100-10 MG/5ML PO SYRP: 10.0000 mL | ORAL_SOLUTION | Freq: Four times a day (QID) | ORAL | 0 refills | Status: DC | PRN

## 2016-02-24 NOTE — ED Provider Notes (Signed)
[place]    CSN: [id number] Arrival date & time: 02/24/16  1300  First Provider Contact:  First MD Initiated Contact with Patient 02/24/16 1432        History   Chief Complaint Chief Complaint  Patient presents with  . Cough    HPI Charlotte Meyer is a 50 y.o. female.   The history is provided by the patient.  Cough  Cough characteristics:  Productive Sputum characteristics:  Yellow Severity:  Moderate Onset quality:  Sudden Duration:  3 days Chronicity:  New Smoker: no   Relieved by:  None tried Worsened by:  Nothing Ineffective treatments:  None tried Associated symptoms: chills, myalgias and rhinorrhea   Associated symptoms: no fever, no rash and no wheezing     Past Medical History:  Diagnosis Date  . Allergic rhinitis   . Depression    treated with zoloft in past  . GERD (gastroesophageal reflux disease)   . Hyperlipemia    no treatment required  . Lactose intolerance   . OA (osteoarthritis) of knee    bilaterally   . Obesity     Patient Active Problem List   Diagnosis Date Noted  . Symptomatic cholelithiasis 09/12/2015  . Cystitis 09/29/2014  . Vitamin D deficiency 09/29/2014  . Pain in joint, shoulder region 08/13/2013  . Chronic venous insufficiency 11/06/2012  . Stasis dermatitis of both legs 11/06/2012  . LBP (low back pain) 11/06/2012  . Vitamin B 12 deficiency 08/05/2012  . Fatigue 08/05/2012  . Well adult exam 01/30/2011  . Anemia 01/30/2011  . ABDOMINAL PAIN-RUQ 12/14/2009  . ABNORMAL EXAM-BILIARY TRACT 12/14/2009  . LACTOSE INTOLERANCE 10/31/2009  . ABDOMINAL PAIN, EPIGASTRIC 10/31/2009  . ELBOW PAIN 03/31/2009  . TOBACCO USE, QUIT 03/31/2009  . SWELLING MASS OR LUMP IN HEAD AND NECK 01/29/2008  . SINUSITIS- ACUTE-NOS 01/08/2008  . KNEE PAIN 06/24/2007  . ANXIETY 03/11/2007  . PARESTHESIA 03/11/2007  . ALLERGIC RHINITIS 01/03/2007  . GERD 01/03/2007  . Dyslipidemia 06/25/2006  . Obesity 06/25/2006  .  DEPRESSION 06/25/2006  . OSTEOARTHRITIS 06/25/2006    Past Surgical History:  Procedure Laterality Date  . CHOLECYSTECTOMY N/A 09/12/2015   Procedure: LAPAROSCOPIC CHOLECYSTECTOMY WITH INTRAOPERATIVE CHOLANGIOGRAM;  Surgeon: Jackolyn Confer, MD;  Location: [facility];  Service: General;  Laterality: N/A;  . FOOT SURGERY      OB History    No data available       Home Medications    Prior to Admission medications   Medication Sig Start Date End Date Taking? Authorizing Provider  fexofenadine-pseudoephedrine (ALLEGRA-D 24) 180-240 MG per 24 hr tablet Take 1 tablet by mouth daily as needed. 09/28/14   Evie Lacks Plotnikov, MD  folic acid (FOLVITE) 1 MG tablet Take 1 mg by mouth daily. 08/22/15   Historical Provider, MD  gabapentin (NEURONTIN) 300 MG capsule Take 300 mg by mouth at bedtime. 07/25/15   Historical Provider, MD  hydrochlorothiazide (MICROZIDE) 12.5 MG capsule Take 12.5 mg by mouth daily as needed (fluid).  08/31/15   Historical Provider, MD  HYDROcodone-acetaminophen (NORCO/VICODIN) 5-325 MG tablet Take 1-2 tablets by mouth every 6 (six) hours as needed for moderate pain or severe pain. 09/12/15   Forde Dandy, MD  ibuprofen (ADVIL,MOTRIN) 200 MG tablet Take 400 mg by mouth every 6 (six) hours as needed for headache or moderate pain.    Historical Provider, MD  methocarbamol (ROBAXIN) 500 MG tablet Take 2 tablets (1,000 mg total) by mouth every 8 (eight) hours as  needed for muscle spasms. 09/13/15   Nat Christen, PA-C  oxyCODONE-acetaminophen (PERCOCET/ROXICET) 5-325 MG tablet Take 1-2 tablets by mouth every 6 (six) hours as needed for moderate pain or severe pain. 09/13/15   Nat Christen, PA-C  pantoprazole (PROTONIX) 40 MG tablet Take 1 tablet (40 mg total) by mouth daily. 09/28/14   Evie Lacks Plotnikov, MD  triamcinolone cream (KENALOG) 0.5 % Apply topically 3 (three) times daily. rash Patient taking differently: Apply 1 application topically 3 (three) times daily as needed (for rash).   09/28/14   Cassandria Anger, MD  Vitamin D, Ergocalciferol, (DRISDOL) 50000 UNITS CAPS capsule TAKE ONE CAPSULE BY MOUTH ONCE A WEEK 11/29/14   Cassandria Anger, MD    Family History Family History  Problem Relation Age of Onset  . Cancer Father     throat  . Diabetes Other   . Cholelithiasis Other     Social History Social History  Substance Use Topics  . Smoking status: Former Research scientist (life sciences)  . Smokeless tobacco: Not on file  . Alcohol use No     Allergies   Penicillins   Review of Systems Review of Systems  Constitutional: Positive for chills. Negative for fever.  HENT: Positive for rhinorrhea.   Respiratory: Positive for cough. Negative for wheezing.   Cardiovascular: Negative.   Gastrointestinal: Negative.   Genitourinary: Negative.   Musculoskeletal: Positive for myalgias.  Skin: Negative for rash.  All other systems reviewed and are negative.    Physical Exam Triage Vital Signs ED Triage Vitals  Enc Vitals Group     BP 02/24/16 1411 120/58     Pulse Rate 02/24/16 1411 74     Resp 02/24/16 1411 16     Temp 02/24/16 1411 98.6 F (37 C)     Temp Source 02/24/16 1411 Oral     SpO2 02/24/16 1411 100 %     Weight --      Height --      Head Circumference --      Peak Flow --      Pain Score 02/24/16 1428 8     Pain Loc --      Pain Edu? --      Excl. in [place]? --    No data found.   Updated Vital Signs BP 120/58 (BP Location: Left Arm)   Pulse 74   Temp 98.6 F (37 C) (Oral)   Resp 16   SpO2 100%   Visual Acuity Right Eye Distance:   Left Eye Distance:   Bilateral Distance:    Right Eye Near:   Left Eye Near:    Bilateral Near:     Physical Exam  Constitutional: She is oriented to person, place, and time. She appears well-developed and well-nourished. No distress.  HENT:  Right Ear: External ear normal.  Left Ear: External ear normal.  Mouth/Throat: Oropharynx is clear and moist.  Neck: Normal range of motion. Neck supple.    Cardiovascular: Normal rate, regular rhythm, normal heart sounds and intact distal pulses.   Pulmonary/Chest: Breath sounds normal. She is in respiratory distress.  Neurological: She is alert and oriented to person, place, and time.  Skin: Skin is warm and dry.  Nursing note and vitals reviewed.    UC Treatments / Results  Labs (all labs ordered are listed, but only abnormal results are displayed) Labs Reviewed - No data to display  EKG  EKG Interpretation None       Radiology No results  found.  Procedures Procedures (including critical care time)  Medications Ordered in UC Medications - No data to display   Initial Impression / Assessment and Plan / UC Course  I have reviewed the triage vital signs and the nursing notes.  Pertinent labs & imaging results that were available during my care of the patient were reviewed by me and considered in my medical decision making (see chart for details).  Clinical Course      Final Clinical Impressions(s) / UC Diagnoses   Final diagnoses:  None    New Prescriptions New Prescriptions   No medications on file     Billy Fischer, MD 02/24/16 2053

## 2016-02-24 NOTE — ED Triage Notes (Signed)
Pt  Reports  Symptoms  Of  Cough   /  Congested     X  3  Days     Pt  Reports  Body  Aches     As   Well pt  denys  Any  Wheezing       She  Is  Sitting  Upright on the  Exam  Table  Speaking in  Complete  sentances

## 2016-02-28 LAB — CULTURE, GROUP A STREP (THRC)

## 2016-03-07 ENCOUNTER — Encounter (HOSPITAL_COMMUNITY): Payer: Self-pay | Admitting: *Deleted

## 2016-03-07 NOTE — Progress Notes (Signed)
Spoke with pt for pre-op call, she denies cardiac history, chest pain or sob.

## 2016-03-08 ENCOUNTER — Encounter (HOSPITAL_COMMUNITY)
Admission: RE | Disposition: A | Discharge status: Home / Self Care | Payer: Self-pay | Source: Ambulatory Visit | Attending: Gastroenterology

## 2016-03-08 ENCOUNTER — Ambulatory Visit (HOSPITAL_COMMUNITY)
Admission: RE | Admit: 2016-03-08 | Discharge: 2016-03-08 | Disposition: A | Discharge status: Home / Self Care | Payer: Medicaid Other | Source: Ambulatory Visit | Attending: Gastroenterology | Admitting: Gastroenterology

## 2016-03-08 ENCOUNTER — Encounter (HOSPITAL_COMMUNITY): Payer: Self-pay

## 2016-03-08 ENCOUNTER — Ambulatory Visit (HOSPITAL_COMMUNITY): Payer: Medicaid Other | Admitting: Anesthesiology

## 2016-03-08 ENCOUNTER — Ambulatory Visit (HOSPITAL_COMMUNITY): Admit: 2016-03-08 | Payer: Medicaid Other

## 2016-03-08 DIAGNOSIS — Z800 Family history of malignant neoplasm of digestive organs: Secondary | ICD-10-CM | POA: Diagnosis not present

## 2016-03-08 DIAGNOSIS — Z1211 Encounter for screening for malignant neoplasm of colon: Secondary | ICD-10-CM | POA: Insufficient documentation

## 2016-03-08 DIAGNOSIS — K648 Other hemorrhoids: Secondary | ICD-10-CM | POA: Diagnosis not present

## 2016-03-08 DIAGNOSIS — K219 Gastro-esophageal reflux disease without esophagitis: Secondary | ICD-10-CM | POA: Insufficient documentation

## 2016-03-08 DIAGNOSIS — Z79899 Other long term (current) drug therapy: Secondary | ICD-10-CM | POA: Insufficient documentation

## 2016-03-08 DIAGNOSIS — K5730 Diverticulosis of large intestine without perforation or abscess without bleeding: Secondary | ICD-10-CM | POA: Diagnosis not present

## 2016-03-08 DIAGNOSIS — D122 Benign neoplasm of ascending colon: Secondary | ICD-10-CM | POA: Diagnosis not present

## 2016-03-08 DIAGNOSIS — Z87891 Personal history of nicotine dependence: Secondary | ICD-10-CM | POA: Insufficient documentation

## 2016-03-08 DIAGNOSIS — K635 Polyp of colon: Secondary | ICD-10-CM | POA: Insufficient documentation

## 2016-03-08 DIAGNOSIS — Z6841 Body Mass Index (BMI) 40.0 and over, adult: Secondary | ICD-10-CM | POA: Diagnosis not present

## 2016-03-08 HISTORY — DX: Personal history of urinary (tract) infections: Z87.440

## 2016-03-08 HISTORY — PX: COLONOSCOPY WITH PROPOFOL: SHX5780

## 2016-03-08 HISTORY — DX: Anemia, unspecified: D64.9

## 2016-03-08 SURGERY — COLONOSCOPY WITH PROPOFOL
Anesthesia: Monitor Anesthesia Care

## 2016-03-08 MED ORDER — PROPOFOL 10 MG/ML IV BOLUS
INTRAVENOUS | Status: DC | PRN
  Administered 2016-03-08 (×5): 20 mg via INTRAVENOUS

## 2016-03-08 MED ORDER — LACTATED RINGERS IV SOLN
INTRAVENOUS | Status: DC
  Administered 2016-03-08 (×2): via INTRAVENOUS

## 2016-03-08 MED ORDER — PROPOFOL 500 MG/50ML IV EMUL
INTRAVENOUS | Status: DC | PRN
  Administered 2016-03-08: 100 ug/kg/min via INTRAVENOUS

## 2016-03-08 NOTE — Anesthesia Procedure Notes (Signed)
Procedure Name: MAC Date/Time: 03/08/2016 2:04 PM Performed by: Carney Living Pre-anesthesia Checklist: Patient identified, Emergency Drugs available, Suction available, Patient being monitored and Timeout performed Patient Re-evaluated:Patient Re-evaluated prior to inductionOxygen Delivery Method: Simple face mask Preoxygenation: Pre-oxygenation with 100% oxygen

## 2016-03-08 NOTE — Transfer of Care (Signed)
Immediate Anesthesia Transfer of Care Note  Patient: Charlotte Meyer  Procedure(s) Performed: Procedure(s): COLONOSCOPY WITH PROPOFOL (N/A)  Patient Location: Endoscopy Unit  Anesthesia Type:MAC  Level of Consciousness: awake, alert , oriented and patient cooperative  Airway & Oxygen Therapy: Patient Spontanous Breathing and Patient connected to nasal cannula oxygen  Post-op Assessment: Report given to RN, Post -op Vital signs reviewed and stable and Patient moving all extremities X 4  Post vital signs: Reviewed and stable  Last Vitals:  Vitals:   03/08/16 1209 03/08/16 1450  BP: (!) 158/81 (!) 169/90  Pulse: 91 86  Resp: (!) 22 20  Temp: 36.6 C     Last Pain:  Vitals:   03/08/16 1209  TempSrc: Oral         Complications: No apparent anesthesia complications

## 2016-03-08 NOTE — Anesthesia Preprocedure Evaluation (Addendum)
Anesthesia Evaluation  Patient identified by MRN, date of birth, ID band Patient awake    Reviewed: Allergy & Precautions, NPO status , Patient's Chart, lab work & pertinent test results  Airway Mallampati: II  TM Distance: >3 FB Neck ROM: Full    Dental  (+) Teeth Intact, Dental Advisory Given   Pulmonary former smoker,    breath sounds clear to auscultation       Cardiovascular + Peripheral Vascular Disease   Rhythm:Regular Rate:Normal     Neuro/Psych Anxiety Depression    GI/Hepatic GERD  Medicated and Controlled,  Endo/Other  Morbid obesity  Renal/GU      Musculoskeletal  (+) Arthritis , Osteoarthritis,    Abdominal   Peds  Hematology  (+) anemia ,   Anesthesia Other Findings   Reproductive/Obstetrics                           Anesthesia Physical Anesthesia Plan  ASA: II  Anesthesia Plan: MAC   Post-op Pain Management:    Induction: Intravenous  Airway Management Planned: Mask  Additional Equipment:   Intra-op Plan:   Post-operative Plan:   Informed Consent: I have reviewed the patients History and Physical, chart, labs and discussed the procedure including the risks, benefits and alternatives for the proposed anesthesia with the patient or authorized representative who has indicated his/her understanding and acceptance.   Dental advisory given  Plan Discussed with: CRNA, Anesthesiologist and Surgeon  Anesthesia Plan Comments:         Anesthesia Quick Evaluation

## 2016-03-08 NOTE — H&P (Signed)
Charlotte Meyer is a 50 y.o. female has presented today for screening colonoscopy  The various methods of treatment have been discussed with the patient and family. After consideration of risks, benefits and other options for treatment, the patient has consented to  Procedure(s): Colonoscopy  as a surgical intervention .  The patient's history has been reviewed, patient examined, no change in status, stable for surgery.  I have reviewed the patient's chart and labs.  Questions were answered to the patient's satisfaction.

## 2016-03-08 NOTE — Op Note (Addendum)
[facility] Patient Name: Charlotte Meyer Procedure Date : 03/08/2016 MRN: [id number] Attending MD: Otis Brace , MD Date of Birth: 10/28/1965 CSN: [id number] Age: 50 Admit Type: Outpatient Procedure:                Colonoscopy Indications:              Screening in patient at increased risk: Family                            history of 1st-degree relative with colorectal                            cancer, This is the patient's first colonoscopy Providers:                Otis Brace, MD, Carolynn Comment, RN, Corliss Parish, Technician Referring MD:              Medicines:                See the Anesthesia note for documentation of the                            administered medications Complications:            No immediate complications. Estimated Blood Loss:     Estimated blood loss: none. Procedure:                Pre-Anesthesia Assessment:                           - Prior to the procedure, a History and Physical                            was performed, and patient medications and                            allergies were reviewed. The patient's tolerance of                            previous anesthesia was also reviewed. The risks                            and benefits of the procedure and the sedation                            options and risks were discussed with the patient.                            All questions were answered, and informed consent                            was obtained. Prior Anticoagulants: The patient has  taken no previous anticoagulant or antiplatelet                            agents. ASA Grade Assessment: III - A patient with                            severe systemic disease. After reviewing the risks                            and benefits, the patient was deemed in                            satisfactory condition to undergo the procedure.                            After obtaining informed consent, the colonoscope                            was passed under direct vision. Throughout the                            procedure, the patient's blood pressure, pulse, and                            oxygen saturations were monitored continuously. The                            EC-[id number] scope was introduced through                            the anus and advanced to the the terminal ileum,                            with identification of the appendiceal orifice and                            IC valve. The colonoscopy was performed with ease.                            The patient tolerated the procedure well. The                            quality of the bowel preparation was excellent. The                            terminal ileum, ileocecal valve, appendiceal                            orifice, and rectum were photographed. Scope In: 2:13:00 PM Scope Out: 2:37:00 PM Scope Withdrawal Time: 0 hours 17 minutes 0 seconds  Total Procedure Duration: 0 hours 24 minutes 0 seconds  Findings:      The perianal and digital rectal examinations were normal.      The terminal ileum appeared normal.  A 7 mm polyp was found in the ascending colon. The polyp was sessile.       The polyp was removed with a cold snare. Resection and retrieval were       complete.      Two flat polyps were found in the distal sigmoid colon. The polyps were       diminutive in size. These polyps were removed with a cold biopsy       forceps. Resection and retrieval were complete.      Multiple large-mouthed diverticula were found in the left colon.      Internal hemorrhoids were found during retroflexion. The hemorrhoids       were small. Impression:               - The examined portion of the ileum was normal.                           - One 7 mm polyp in the ascending colon, removed                            with a cold snare. Resected and retrieved.                            - Two diminutive polyps in the distal sigmoid                            colon, removed with a cold biopsy forceps. Resected                            and retrieved.                           - Diverticulosis in the left colon.                           - Internal hemorrhoids. Moderate Sedation:      Moderate (conscious) sedation was personally administered by an       anesthesia professional. The following parameters were monitored: oxygen       saturation, heart rate, blood pressure, and response to care. Recommendation:           - Discharge patient to home (ambulatory).                           - Resume previous diet.                           - Continue present medications.                           - Await pathology results.                           - Repeat colonoscopy in 3 - 5 years for                            surveillance.                           -  Return to GI office PRN. Procedure Code(s):        --- Professional ---                           [phone], Colonoscopy, flexible; with removal of                            tumor(s), polyp(s), or other lesion(s) by snare                            technique                           45380, 50, Colonoscopy, flexible; with biopsy,                            single or multiple Diagnosis Code(s):        --- Professional ---                           Z80.0, Family history of malignant neoplasm of                            digestive organs                           K64.8, Other hemorrhoids                           D12.2, Benign neoplasm of ascending colon                           D12.5, Benign neoplasm of sigmoid colon                           K57.30, Diverticulosis of large intestine without                            perforation or abscess without bleeding CPT copyright 2016 [employer]. All rights reserved. The codes documented in this report are preliminary and upon coder review may  be revised to meet  current compliance requirements. Otis Brace, MD Otis Brace, MD 03/08/2016 2:44:30 PM Number of Addenda: 0

## 2016-03-08 NOTE — Discharge Instructions (Signed)
YOU HAD AN ENDOSCOPIC PROCEDURE TODAY: Refer to the procedure report and other information in the discharge instructions given to you for any specific questions about what was found during the examination. If this information does not answer your questions, please call [facility] office at [phone] to clarify.   YOU SHOULD EXPECT: Some feelings of bloating in the abdomen. Passage of more gas than usual. Walking can help get rid of the air that was put into your GI tract during the procedure and reduce the bloating. If you had a lower endoscopy (such as a colonoscopy or flexible sigmoidoscopy) you may notice spotting of blood in your stool or on the toilet paper. Some abdominal soreness may be present for a day or two, also.  DIET: Your first meal following the procedure should be a light meal and then it is ok to progress to your normal diet. A half-sandwich or bowl of soup is an example of a good first meal. Heavy or fried foods are harder to digest and may make you feel nauseous or bloated. Drink plenty of fluids but you should avoid alcoholic beverages for 24 hours. If you had a esophageal dilation, please see attached instructions for diet.   ACTIVITY: Your care partner should take you home directly after the procedure. You should plan to take it easy, moving slowly for the rest of the day. You can resume normal activity the day after the procedure however YOU SHOULD NOT DRIVE, use power tools, machinery or perform tasks that involve climbing or major physical exertion for 24 hours (because of the sedation medicines used during the test).   SYMPTOMS TO REPORT IMMEDIATELY: A gastroenterologist can be reached at any hour. Please call [phone]  for any of the following symptoms:  Following lower endoscopy (colonoscopy, flexible sigmoidoscopy) Excessive amounts of blood in the stool  Significant tenderness, worsening of abdominal pains  Swelling of the abdomen that is new, acute  Fever of 100 or  higher  Following upper endoscopy (EGD, EUS, ERCP, esophageal dilation) Vomiting of blood or coffee ground material  New, significant abdominal pain  New, significant chest pain or pain under the shoulder blades  Painful or persistently difficult swallowing  New shortness of breath  Black, tarry-looking or red, bloody stools  FOLLOW UP:  If any biopsies were taken you will be contacted by phone or by letter within the next 1-3 weeks. Call [phone]  if you have not heard about the biopsies in 3 weeks.  Please also call with any specific questions about appointments or follow up tests. YOU HAD AN ENDOSCOPIC PROCEDURE TODAY: Refer to the procedure report and other information in the discharge instructions given to you for any specific questions about what was found during the examination. If this information does not answer your questions, please call [facility] office at [phone] to clarify.   YOU SHOULD EXPECT: Some feelings of bloating in the abdomen. Passage of more gas than usual. Walking can help get rid of the air that was put into your GI tract during the procedure and reduce the bloating. If you had a lower endoscopy (such as a colonoscopy or flexible sigmoidoscopy) you may notice spotting of blood in your stool or on the toilet paper. Some abdominal soreness may be present for a day or two, also.  DIET: Your first meal following the procedure should be a light meal and then it is ok to progress to your normal diet. A half-sandwich or bowl of soup is an example  of a good first meal. Heavy or fried foods are harder to digest and may make you feel nauseous or bloated. Drink plenty of fluids but you should avoid alcoholic beverages for 24 hours. If you had a esophageal dilation, please see attached instructions for diet.   ACTIVITY: Your care partner should take you home directly after the procedure. You should plan to take it easy, moving slowly for the rest of the day. You can resume  normal activity the day after the procedure however YOU SHOULD NOT DRIVE, use power tools, machinery or perform tasks that involve climbing or major physical exertion for 24 hours (because of the sedation medicines used during the test).   SYMPTOMS TO REPORT IMMEDIATELY: A gastroenterologist can be reached at any hour. Please call [phone]  for any of the following symptoms:  Following lower endoscopy (colonoscopy, flexible sigmoidoscopy) Excessive amounts of blood in the stool  Significant tenderness, worsening of abdominal pains  Swelling of the abdomen that is new, acute  Fever of 100 or higher  Following upper endoscopy (EGD, EUS, ERCP, esophageal dilation) Vomiting of blood or coffee ground material  New, significant abdominal pain  New, significant chest pain or pain under the shoulder blades  Painful or persistently difficult swallowing  New shortness of breath  Black, tarry-looking or red, bloody stools  FOLLOW UP:  If any biopsies were taken you will be contacted by phone or by letter within the next 1-3 weeks. Call [phone]  if you have not heard about the biopsies in 3 weeks.  Please also call with any specific questions about appointments or follow up tests. YOU HAD AN ENDOSCOPIC PROCEDURE TODAY: Refer to the procedure report and other information in the discharge instructions given to you for any specific questions about what was found during the examination. If this information does not answer your questions, please call [facility] office at [phone] to clarify.   YOU SHOULD EXPECT: Some feelings of bloating in the abdomen. Passage of more gas than usual. Walking can help get rid of the air that was put into your GI tract during the procedure and reduce the bloating. If you had a lower endoscopy (such as a colonoscopy or flexible sigmoidoscopy) you may notice spotting of blood in your stool or on the toilet paper. Some abdominal soreness may be present for a day or two,  also.  DIET: Your first meal following the procedure should be a light meal and then it is ok to progress to your normal diet. A half-sandwich or bowl of soup is an example of a good first meal. Heavy or fried foods are harder to digest and may make you feel nauseous or bloated. Drink plenty of fluids but you should avoid alcoholic beverages for 24 hours. If you had a esophageal dilation, please see attached instructions for diet.   ACTIVITY: Your care partner should take you home directly after the procedure. You should plan to take it easy, moving slowly for the rest of the day. You can resume normal activity the day after the procedure however YOU SHOULD NOT DRIVE, use power tools, machinery or perform tasks that involve climbing or major physical exertion for 24 hours (because of the sedation medicines used during the test).   SYMPTOMS TO REPORT IMMEDIATELY: A gastroenterologist can be reached at any hour. Please call [phone]  for any of the following symptoms:  Following lower endoscopy (colonoscopy, flexible sigmoidoscopy) Excessive amounts of blood in the stool  Significant tenderness, worsening of abdominal  pains  Swelling of the abdomen that is new, acute  Fever of 100 or higher  Following upper endoscopy (EGD, EUS, ERCP, esophageal dilation) Vomiting of blood or coffee ground material  New, significant abdominal pain  New, significant chest pain or pain under the shoulder blades  Painful or persistently difficult swallowing  New shortness of breath  Black, tarry-looking or red, bloody stools  FOLLOW UP:  If any biopsies were taken you will be contacted by phone or by letter within the next 1-3 weeks. Call [phone]  if you have not heard about the biopsies in 3 weeks.  Please also call with any specific questions about appointments or follow up tests. YOU HAD AN ENDOSCOPIC PROCEDURE TODAY: Refer to the procedure report and other information in the discharge instructions  given to you for any specific questions about what was found during the examination. If this information does not answer your questions, please call [facility] office at [phone] to clarify.   YOU SHOULD EXPECT: Some feelings of bloating in the abdomen. Passage of more gas than usual. Walking can help get rid of the air that was put into your GI tract during the procedure and reduce the bloating. If you had a lower endoscopy (such as a colonoscopy or flexible sigmoidoscopy) you may notice spotting of blood in your stool or on the toilet paper. Some abdominal soreness may be present for a day or two, also.  DIET: Your first meal following the procedure should be a light meal and then it is ok to progress to your normal diet. A half-sandwich or bowl of soup is an example of a good first meal. Heavy or fried foods are harder to digest and may make you feel nauseous or bloated. Drink plenty of fluids but you should avoid alcoholic beverages for 24 hours. If you had a esophageal dilation, please see attached instructions for diet.   ACTIVITY: Your care partner should take you home directly after the procedure. You should plan to take it easy, moving slowly for the rest of the day. You can resume normal activity the day after the procedure however YOU SHOULD NOT DRIVE, use power tools, machinery or perform tasks that involve climbing or major physical exertion for 24 hours (because of the sedation medicines used during the test).   SYMPTOMS TO REPORT IMMEDIATELY: A gastroenterologist can be reached at any hour. Please call [phone]  for any of the following symptoms:  Following lower endoscopy (colonoscopy, flexible sigmoidoscopy) Excessive amounts of blood in the stool  Significant tenderness, worsening of abdominal pains  Swelling of the abdomen that is new, acute  Fever of 100 or higher  Following upper endoscopy (EGD, EUS, ERCP, esophageal dilation) Vomiting of blood or coffee ground material    New, significant abdominal pain  New, significant chest pain or pain under the shoulder blades  Painful or persistently difficult swallowing  New shortness of breath  Black, tarry-looking or red, bloody stools  FOLLOW UP:  If any biopsies were taken you will be contacted by phone or by letter within the next 1-3 weeks. Call [phone]  if you have not heard about the biopsies in 3 weeks.  Please also call with any specific questions about appointments or follow up tests.

## 2016-03-08 NOTE — Anesthesia Postprocedure Evaluation (Signed)
Anesthesia Post Note  Patient: Charlotte Meyer  Procedure(s) Performed: Procedure(s) (LRB): COLONOSCOPY WITH PROPOFOL (N/A)  Patient location during evaluation: Endoscopy Anesthesia Type: MAC Level of consciousness: awake Pain management: pain level controlled Vital Signs Assessment: post-procedure vital signs reviewed and stable Respiratory status: spontaneous breathing Anesthetic complications: no    Last Vitals:  Vitals:   03/08/16 1510 03/08/16 1520  BP: (!) 113/55 129/65  Pulse: 78 87  Resp: (!) 22 (!) 27  Temp:      Last Pain:  Vitals:   03/08/16 1450  TempSrc: Oral                 EDWARDS,Mehlani Blankenburg

## 2016-03-10 ENCOUNTER — Encounter (HOSPITAL_COMMUNITY): Payer: Self-pay | Admitting: Gastroenterology

## 2016-03-14 NOTE — H&P (Signed)
Primary Care Physician:  Walker Kehr, MD Primary Gastroenterologist:  Dr. Alessandra Bevels   Reason for Visit   : Screening colonoscopy   HPI: Charlotte Meyer is a 50 y.o. female  has presented on 03/08/2016 for screening colonoscopy The various methods of treatment have been discussed with the patient and family. After consideration of risks, benefits and other options for treatment, the patient has consented to Procedure(s): Colonoscopy as a surgical intervention.The patient's history has been reviewed, patient examined, no change in status, stable for surgery. I have reviewed the patient's chart and labs. Questions were answeredto the patient's satisfaction.  Denied abdominal pain, nausea, vomiting. Denied fever, chills. Denied chest pain, SOB.   Past Medical History:  Diagnosis Date  . Allergic rhinitis   . Anemia   . Depression    treated with zoloft in past  . GERD (gastroesophageal reflux disease)   . History of UTI   . Hyperlipemia    no treatment required  . Lactose intolerance   . OA (osteoarthritis) of knee    bilaterally   . Obesity     Past Surgical History:  Procedure Laterality Date  . CHOLECYSTECTOMY N/A 09/12/2015   Procedure: LAPAROSCOPIC CHOLECYSTECTOMY WITH INTRAOPERATIVE CHOLANGIOGRAM;  Surgeon: Jackolyn Confer, MD;  Location: [facility];  Service: General;  Laterality: N/A;  . COLONOSCOPY WITH PROPOFOL N/A 03/08/2016   Procedure: COLONOSCOPY WITH PROPOFOL;  Surgeon: Otis Brace, MD;  Location: [place];  Service: Gastroenterology;  Laterality: N/A;  . FOOT SURGERY      Prior to Admission medications   Medication Sig Start Date End Date Taking? Authorizing Provider  fexofenadine-pseudoephedrine (ALLEGRA-D 24) 180-240 MG per 24 hr tablet Take 1 tablet by mouth daily as needed. 09/28/14  Yes Evie Lacks Plotnikov, MD  folic acid (FOLVITE) 1 MG tablet Take 1 mg by mouth daily. 08/22/15  Yes Historical Provider, MD  gabapentin (NEURONTIN) 300 MG capsule  Take 300 mg by mouth at bedtime. Takes as needed 07/25/15  Yes Historical Provider, MD  guaiFENesin-codeine (ROBITUSSIN AC) 100-10 MG/5ML syrup Take 10 mLs by mouth 4 (four) times daily as needed for cough. 02/24/16  Yes Billy Fischer, MD  hydrochlorothiazide (MICROZIDE) 12.5 MG capsule Take 12.5 mg by mouth daily as needed (fluid).  08/31/15  Yes Historical Provider, MD  HYDROcodone-acetaminophen (NORCO/VICODIN) 5-325 MG tablet Take 1-2 tablets by mouth every 6 (six) hours as needed for moderate pain or severe pain. 09/12/15  Yes Forde Dandy, MD  ibuprofen (ADVIL,MOTRIN) 200 MG tablet Take 400 mg by mouth every 6 (six) hours as needed for headache or moderate pain.   Yes Historical Provider, MD  ipratropium (ATROVENT) 0.06 % nasal spray Place 2 sprays into both nostrils 4 (four) times daily. 02/24/16  Yes Billy Fischer, MD  methocarbamol (ROBAXIN) 500 MG tablet Take 2 tablets (1,000 mg total) by mouth every 8 (eight) hours as needed for muscle spasms. 09/13/15  Yes Nat Christen, PA-C  oxyCODONE-acetaminophen (PERCOCET/ROXICET) 5-325 MG tablet Take 1-2 tablets by mouth every 6 (six) hours as needed for moderate pain or severe pain. 09/13/15  Yes Nat Christen, PA-C  pantoprazole (PROTONIX) 40 MG tablet Take 1 tablet (40 mg total) by mouth daily. 09/28/14  Yes Evie Lacks Plotnikov, MD  triamcinolone cream (KENALOG) 0.5 % Apply topically 3 (three) times daily. rash Patient taking differently: Apply 1 application topically 3 (three) times daily as needed (for rash).  09/28/14  Yes Cassandria Anger, MD  Vitamin D, Ergocalciferol, (DRISDOL) 50000 UNITS CAPS  capsule TAKE ONE CAPSULE BY MOUTH ONCE A WEEK 11/29/14  Yes Evie Lacks Plotnikov, MD    Scheduled Meds: Continuous Infusions: PRN Meds:.  Allergies as of 02/15/2016 - Review Complete 09/12/2015  Allergen Reaction Noted  . Penicillins      Family History  Problem Relation Age of Onset  . Cancer Father     throat  . Diabetes Other   . Cholelithiasis  Other     Social History   Social History  . Marital status: Single    Spouse name: N/A  . Number of children: 1  . Years of education: N/A   Occupational History  .  [employer] Doc   Social History Main Topics  . Smoking status: Former Research scientist (life sciences)  . Smokeless tobacco: Never Used     Comment: quit them "long time ago"  . Alcohol use No  . Drug use: No  . Sexual activity: Not on file   Other Topics Concern  . Not on file   Social History Narrative   Daily caffeine use    Review of Systems: All negative except as stated above in HPI.  Physical Exam: Vital signs: Vitals:   03/08/16 1510 03/08/16 1520  BP: (!) 113/55 129/65  Pulse: 78 87  Resp: (!) 22 (!) 27  Temp:       General:   Alert,  Well-developed, Obese, pleasant and cooperative in NAD Lungs:  Clear throughout to auscultation.   No wheezes, crackles, or rhonchi. No acute distress. Heart:  Regular rate and rhythm; no murmurs, clicks, rubs,  or gallops. Abdomen: soft, NT, ND, BS +  LE : no edema   Rectal:  Deferred  GI:  Lab Results: No results for input(s): WBC, HGB, HCT, PLT in the last 72 hours. BMET No results for input(s): NA, K, CL, CO2, GLUCOSE, BUN, CREATININE, CALCIUM in the last 72 hours. LFT No results for input(s): PROT, ALBUMIN, AST, ALT, ALKPHOS, BILITOT, BILIDIR, IBILI in the last 72 hours. PT/INR No results for input(s): LABPROT, INR in the last 72 hours.   Studies/Results: No results found.  Impression/Plan: Colon cancer screening   PLAN ------ - Colonoscopy. Risk, benefits and alternative D/W patient. Verbalized understanding.     LOS: 0 days   Otis Brace  MD, FACP 03/14/2016, 9:28 AM  Pager [phone] If no answer or after 5 PM call [phone]

## 2016-05-08 ENCOUNTER — Ambulatory Visit (INDEPENDENT_AMBULATORY_CARE_PROVIDER_SITE_OTHER): Payer: Medicaid Other | Admitting: Podiatry

## 2016-05-08 VITALS — BP 158/84 | HR 72

## 2016-05-08 DIAGNOSIS — M7662 Achilles tendinitis, left leg: Secondary | ICD-10-CM | POA: Diagnosis not present

## 2016-05-08 DIAGNOSIS — M7732 Calcaneal spur, left foot: Secondary | ICD-10-CM

## 2016-05-08 DIAGNOSIS — M79671 Pain in right foot: Secondary | ICD-10-CM

## 2016-05-08 DIAGNOSIS — M7731 Calcaneal spur, right foot: Secondary | ICD-10-CM | POA: Diagnosis not present

## 2016-05-08 DIAGNOSIS — M7752 Other enthesopathy of left foot: Secondary | ICD-10-CM | POA: Diagnosis not present

## 2016-05-08 DIAGNOSIS — M7661 Achilles tendinitis, right leg: Secondary | ICD-10-CM

## 2016-05-08 DIAGNOSIS — M7751 Other enthesopathy of right foot: Secondary | ICD-10-CM

## 2016-05-08 DIAGNOSIS — M6528 Calcific tendinitis, other site: Secondary | ICD-10-CM

## 2016-05-08 DIAGNOSIS — M79672 Pain in left foot: Principal | ICD-10-CM

## 2016-05-08 MED ORDER — BETAMETHASONE SOD PHOS & ACET 6 (3-3) MG/ML IJ SUSP: 3.0000 mg | Freq: Once | INTRAMUSCULAR | Status: DC

## 2016-05-08 MED ORDER — MELOXICAM 15 MG PO TABS: 15.0000 mg | ORAL_TABLET | Freq: Every day | ORAL | 1 refills | Status: AC

## 2016-05-08 MED ORDER — METHYLPREDNISOLONE 4 MG PO TBPK: ORAL_TABLET | ORAL | 0 refills | Status: DC

## 2016-05-08 NOTE — Progress Notes (Signed)
Patient ID: Charlotte Meyer, female   DOB: March 10, 1966, 50 y.o.   MRN: [id number] Subjective:  Patient comes to the office today as a new patient for evaluation of bilateral heel pain. Patient states that the pain has been going on to the back of her heels for approximate 6 months now. Patient presents today for further treatment and evaluation    Objective/Physical Exam General: The patient is alert and oriented x3 in no acute distress.  Dermatology: Skin is warm, dry and supple bilateral lower extremities. Negative for open lesions or macerations.  Vascular: Palpable pedal pulses bilaterally. No edema or erythema noted. Capillary refill within normal limits.  Neurological: Epicritic and protective threshold grossly intact bilaterally.   Musculoskeletal Exam: Significant pain on palpation noted to the posterior lateral aspect of the posterior tubercle calcaneus bilateral. There is also a large palpable Haglund's deformity noted to the bilateral posterior heels as well.  Pain on palpation also noted to the insertion of the Achilles tendon throughout the superior aspect of the posterior tubercle of the calcaneus bilateral.  Range of motion within normal limits to all pedal and ankle joints bilateral. Muscle strength 5/5 in all groups bilateral.   Radiographic Exam:  Calcific bodies noted within the Achilles tendon into the bilateral lower extremities. Osteoarthritis and mild DJD noted as well to the ankle joints bilateral  Assessment: #1 Haglund's deformity bilateral #2 insertional Achilles tendinitis bilateral #3 retrocalcaneal bursitis bilateral #4 posterior heel spurring with calcific bodies bilateral lower extremities   Plan of Care:  #1 Patient was evaluated. #2 injection of 0.5 mL Celestone Soluspan injected into the retrocalcaneal bursa of the bilateral lower extremities. Care was taken to avoid direct injection into the Achilles tendon's. Patient was informed that with injections  to the posterior aspect of the heel at the level of the insertion of the Achilles tendon there is a small increase in incident of tendon rupture. #3 prescription for Medrol Dosepak #4 prescription for meloxicam 15 mg #5 discussed conservative and surgical management of insertional Achilles tendinitis. #6 return to clinic in 4 weeks  Patient states that she gets new insurance beginning in January at which time we can provide topical anti-inflammatory pain cream through [facility]   Dr. Edrick Kins, [place]

## 2016-05-08 NOTE — Progress Notes (Signed)
   Subjective:    Patient ID: Charlotte Meyer, female    DOB: 1965-07-05, 50 y.o.   MRN: [id number]  HPI    Review of Systems  All other systems reviewed and are negative.      Objective:   Physical Exam        Assessment & Plan:

## 2016-05-08 NOTE — Patient Instructions (Signed)

## 2016-06-21 DIAGNOSIS — R05 Cough: Secondary | ICD-10-CM | POA: Diagnosis not present

## 2016-06-21 DIAGNOSIS — R632 Polyphagia: Secondary | ICD-10-CM | POA: Diagnosis not present

## 2016-06-21 DIAGNOSIS — F439 Reaction to severe stress, unspecified: Secondary | ICD-10-CM | POA: Diagnosis not present

## 2016-06-21 DIAGNOSIS — N926 Irregular menstruation, unspecified: Secondary | ICD-10-CM | POA: Diagnosis not present

## 2016-06-21 DIAGNOSIS — I10 Essential (primary) hypertension: Secondary | ICD-10-CM | POA: Diagnosis not present

## 2016-06-21 DIAGNOSIS — Z5181 Encounter for therapeutic drug level monitoring: Secondary | ICD-10-CM | POA: Diagnosis not present

## 2016-06-26 ENCOUNTER — Ambulatory Visit: Payer: Medicaid Other | Admitting: Podiatry

## 2016-06-28 DIAGNOSIS — I10 Essential (primary) hypertension: Secondary | ICD-10-CM | POA: Diagnosis not present

## 2016-06-28 DIAGNOSIS — F439 Reaction to severe stress, unspecified: Secondary | ICD-10-CM | POA: Diagnosis not present

## 2016-06-28 DIAGNOSIS — R632 Polyphagia: Secondary | ICD-10-CM | POA: Diagnosis not present

## 2016-07-08 ENCOUNTER — Ambulatory Visit (INDEPENDENT_AMBULATORY_CARE_PROVIDER_SITE_OTHER): Payer: BLUE CROSS/BLUE SHIELD | Admitting: Podiatry

## 2016-07-08 ENCOUNTER — Encounter: Payer: Self-pay | Admitting: Podiatry

## 2016-07-08 DIAGNOSIS — M7661 Achilles tendinitis, right leg: Secondary | ICD-10-CM | POA: Diagnosis not present

## 2016-07-08 DIAGNOSIS — M7732 Calcaneal spur, left foot: Secondary | ICD-10-CM | POA: Diagnosis not present

## 2016-07-08 DIAGNOSIS — M7731 Calcaneal spur, right foot: Secondary | ICD-10-CM | POA: Diagnosis not present

## 2016-07-08 DIAGNOSIS — M79671 Pain in right foot: Secondary | ICD-10-CM

## 2016-07-08 DIAGNOSIS — M7662 Achilles tendinitis, left leg: Secondary | ICD-10-CM

## 2016-07-08 DIAGNOSIS — M6528 Calcific tendinitis, other site: Secondary | ICD-10-CM

## 2016-07-08 DIAGNOSIS — M79672 Pain in left foot: Secondary | ICD-10-CM

## 2016-07-19 MED ORDER — BETAMETHASONE SOD PHOS & ACET 6 (3-3) MG/ML IJ SUSP: 3.0000 mg | Freq: Once | INTRAMUSCULAR | Status: DC

## 2016-07-19 NOTE — Progress Notes (Signed)
Patient ID: Charlotte Meyer, female   DOB: May 25, 1966, 51 y.o.   MRN: [id number] Subjective:  Patient presents today for follow-up evaluation of insertional Achilles tendinitis to the bilateral lower extremities. Patient states that she still has mild pain however she's feeling much better since her last visit. Patient is more symptomatic on the right versus the left lower extremity.    Objective/Physical Exam General: The patient is alert and oriented x3 in no acute distress.  Dermatology: Skin is warm, dry and supple bilateral lower extremities. Negative for open lesions or macerations.  Vascular: Palpable pedal pulses bilaterally. No edema or erythema noted. Capillary refill within normal limits.  Neurological: Epicritic and protective threshold grossly intact bilaterally.   Musculoskeletal Exam: Significant pain on palpation noted to the posterior lateral aspect of the posterior tubercle calcaneus bilateral. There is also a large palpable Haglund's deformity noted to the bilateral posterior heels as well.  Pain on palpation also noted to the insertion of the Achilles tendon throughout the superior aspect of the posterior tubercle of the calcaneus bilateral.  Range of motion within normal limits to all pedal and ankle joints bilateral. Muscle strength 5/5 in all groups bilateral.   Radiographic Exam:  Calcific bodies noted within the Achilles tendon into the bilateral lower extremities. Osteoarthritis and mild DJD noted as well to the ankle joints bilateral  Assessment: #1 Haglund's deformity bilateral #2 insertional Achilles tendinitis bilateral #3 retrocalcaneal bursitis bilateral #4 posterior heel spurring with calcific bodies bilateral lower extremities #5 bilateral heel pain   Plan of Care:  #1 Patient was evaluated. #2 injection of 0.5 mL Celestone Soluspan injected into the retrocalcaneal bursa of the right lower extremities. Care was taken to avoid direct injection into the  Achilles tendon's. Patient was informed that with injections to the posterior aspect of the heel at the level of the insertion of the Achilles tendon there is a small increase in incident of tendon rupture. #3 patient completed her Medrol Dosepak. Continue meloxicam 15 mg as needed. #4 today the patient was scanned for custom molded orthotics #5 return to clinic in 4 weeks for orthotic pickup #6 discussed anti-inflammatory pain cream through [facility] on next visit  Dr. Edrick Kins, [place]

## 2016-07-22 ENCOUNTER — Ambulatory Visit: Payer: BLUE CROSS/BLUE SHIELD | Admitting: Podiatry

## 2016-07-31 ENCOUNTER — Ambulatory Visit (INDEPENDENT_AMBULATORY_CARE_PROVIDER_SITE_OTHER): Payer: Self-pay | Admitting: Podiatry

## 2016-07-31 DIAGNOSIS — M7662 Achilles tendinitis, left leg: Secondary | ICD-10-CM

## 2016-07-31 DIAGNOSIS — M7661 Achilles tendinitis, right leg: Secondary | ICD-10-CM

## 2016-07-31 NOTE — Patient Instructions (Signed)

## 2016-08-01 NOTE — Progress Notes (Signed)
Patient presents for orthotic pick up.  Verbal and written break in and wear instructions given.  Patient will follow up in 4 weeks if symptoms worsen or fail to improve. 

## 2016-08-23 DIAGNOSIS — Z131 Encounter for screening for diabetes mellitus: Secondary | ICD-10-CM | POA: Diagnosis not present

## 2016-08-23 DIAGNOSIS — E559 Vitamin D deficiency, unspecified: Secondary | ICD-10-CM | POA: Diagnosis not present

## 2016-08-23 DIAGNOSIS — F439 Reaction to severe stress, unspecified: Secondary | ICD-10-CM | POA: Diagnosis not present

## 2016-08-23 DIAGNOSIS — E785 Hyperlipidemia, unspecified: Secondary | ICD-10-CM | POA: Diagnosis not present

## 2016-08-23 DIAGNOSIS — Z0000 Encounter for general adult medical examination without abnormal findings: Secondary | ICD-10-CM | POA: Diagnosis not present

## 2016-08-23 DIAGNOSIS — N926 Irregular menstruation, unspecified: Secondary | ICD-10-CM | POA: Diagnosis not present

## 2016-08-23 DIAGNOSIS — Z01118 Encounter for examination of ears and hearing with other abnormal findings: Secondary | ICD-10-CM | POA: Diagnosis not present

## 2016-08-23 DIAGNOSIS — Z136 Encounter for screening for cardiovascular disorders: Secondary | ICD-10-CM | POA: Diagnosis not present

## 2016-08-23 DIAGNOSIS — F988 Other specified behavioral and emotional disorders with onset usually occurring in childhood and adolescence: Secondary | ICD-10-CM | POA: Diagnosis not present

## 2016-08-23 DIAGNOSIS — I10 Essential (primary) hypertension: Secondary | ICD-10-CM | POA: Diagnosis not present

## 2016-09-09 ENCOUNTER — Ambulatory Visit: Payer: BLUE CROSS/BLUE SHIELD | Admitting: Podiatry

## 2016-09-20 DIAGNOSIS — I10 Essential (primary) hypertension: Secondary | ICD-10-CM | POA: Diagnosis not present

## 2016-09-20 DIAGNOSIS — M778 Other enthesopathies, not elsewhere classified: Secondary | ICD-10-CM | POA: Diagnosis not present

## 2016-09-20 DIAGNOSIS — F439 Reaction to severe stress, unspecified: Secondary | ICD-10-CM | POA: Diagnosis not present

## 2016-10-31 ENCOUNTER — Ambulatory Visit (INDEPENDENT_AMBULATORY_CARE_PROVIDER_SITE_OTHER): Payer: BLUE CROSS/BLUE SHIELD | Admitting: Internal Medicine

## 2016-10-31 ENCOUNTER — Encounter: Payer: Self-pay | Admitting: Internal Medicine

## 2016-10-31 VITALS — BP 138/90 | HR 100 | Ht 66.0 in | Wt 327.0 lb

## 2016-10-31 DIAGNOSIS — M25562 Pain in left knee: Secondary | ICD-10-CM | POA: Diagnosis not present

## 2016-10-31 DIAGNOSIS — M25561 Pain in right knee: Secondary | ICD-10-CM | POA: Diagnosis not present

## 2016-10-31 DIAGNOSIS — R6889 Other general symptoms and signs: Secondary | ICD-10-CM

## 2016-10-31 DIAGNOSIS — G8929 Other chronic pain: Secondary | ICD-10-CM

## 2016-10-31 DIAGNOSIS — J309 Allergic rhinitis, unspecified: Secondary | ICD-10-CM

## 2016-10-31 MED ORDER — METHYLPREDNISOLONE ACETATE 80 MG/ML IJ SUSP
80.0000 mg | Freq: Once | INTRAMUSCULAR | Status: AC
  Administered 2016-10-31: 80 mg via INTRAMUSCULAR

## 2016-10-31 MED ORDER — PREDNISONE 10 MG PO TABS: ORAL_TABLET | ORAL | 0 refills | Status: DC

## 2016-10-31 NOTE — Assessment & Plan Note (Signed)
I am unable to determine an etiology except for her morbid obesity, suggested f/u with PCP regarding CPX with labs as exam is o/w benign today

## 2016-10-31 NOTE — Assessment & Plan Note (Signed)
Mild to mod, for depomedrol IM 80, predpac asd, to f/u any worsening symptoms or concerns 

## 2016-10-31 NOTE — Progress Notes (Signed)
Subjective:    Patient ID: Charlotte Meyer, female    DOB: [birth year], 51 y.o.   MRN: [id number]  HPI  Here with CC of "my insides are warm" and pointing to her upper torso diffusely.  Sensation is worse with exertion but o/w Pt denies chest pain, increased sob or doe, wheezing, orthopnea, PND, increased LE swelling, palpitations, dizziness or syncope.  Pt denies fever, wt loss, night sweats, loss of appetite, or other constitutional symptoms  LMP is now and denies flushing or hot flashes.  Had recent TFT's in the past yr WNL.  Does have several wks ongoing nasal allergy symptoms with clearish congestion, itch and sneezing, without fever, pain, ST, cough, swelling or wheezing.  Also c/o bilat knee pain with occas swelling worse to walk, better to sit, mild to mod intermittent without giveaways or falls Past Medical History:  Diagnosis Date  . Allergic rhinitis   . Anemia   . Depression    treated with zoloft in past  . GERD (gastroesophageal reflux disease)   . History of UTI   . Hyperlipemia    no treatment required  . Lactose intolerance   . OA (osteoarthritis) of knee    bilaterally   . Obesity    Past Surgical History:  Procedure Laterality Date  . CHOLECYSTECTOMY N/A 09/12/2015   Procedure: LAPAROSCOPIC CHOLECYSTECTOMY WITH INTRAOPERATIVE CHOLANGIOGRAM;  Surgeon: Jackolyn Confer, MD;  Location: [facility];  Service: General;  Laterality: N/A;  . COLONOSCOPY WITH PROPOFOL N/A 03/08/2016   Procedure: COLONOSCOPY WITH PROPOFOL;  Surgeon: Otis Brace, MD;  Location: [place];  Service: Gastroenterology;  Laterality: N/A;  . FOOT SURGERY      reports that she has quit smoking. She has never used smokeless tobacco. She reports that she does not drink alcohol or use drugs. family history includes Cancer in her father; Cholelithiasis in her other; Diabetes in her other. Allergies  Allergen Reactions  . Penicillins     Yeast infections Has patient had a PCN reaction causing  immediate rash, facial/tongue/throat swelling, SOB or lightheadedness with hypotension: No Has patient had a PCN reaction causing severe rash involving mucus membranes or skin necrosis: No Has patient had a PCN reaction that required hospitalization No Has patient had a PCN reaction occurring within the last 10 years: No If all of the above answers are "NO", then may proceed with Cephalosporin use.    Current Outpatient Prescriptions on File Prior to Visit  Medication Sig Dispense Refill  . fexofenadine-pseudoephedrine (ALLEGRA-D 24) 180-240 MG per 24 hr tablet Take 1 tablet by mouth daily as needed. 30 tablet 1  . folic acid (FOLVITE) 1 MG tablet Take 1 mg by mouth daily.  5  . gabapentin (NEURONTIN) 300 MG capsule Take 300 mg by mouth at bedtime. Takes as needed  1  . hydrochlorothiazide (MICROZIDE) 12.5 MG capsule Take 12.5 mg by mouth daily as needed (fluid).   5  . HYDROcodone-acetaminophen (NORCO/VICODIN) 5-325 MG tablet Take 1-2 tablets by mouth every 6 (six) hours as needed for moderate pain or severe pain. 8 tablet 0  . ibuprofen (ADVIL,MOTRIN) 200 MG tablet Take 400 mg by mouth every 6 (six) hours as needed for headache or moderate pain.    Marland Kitchen ipratropium (ATROVENT) 0.06 % nasal spray Place 2 sprays into both nostrils 4 (four) times daily. 15 mL 1  . methocarbamol (ROBAXIN) 500 MG tablet Take 2 tablets (1,000 mg total) by mouth every 8 (eight) hours as needed for muscle spasms. [place]  tablet 0  . oxyCODONE-acetaminophen (PERCOCET/ROXICET) 5-325 MG tablet Take 1-2 tablets by mouth every 6 (six) hours as needed for moderate pain or severe pain. 40 tablet 0  . pantoprazole (PROTONIX) 40 MG tablet Take 1 tablet (40 mg total) by mouth daily. 90 tablet 0  . triamcinolone cream (KENALOG) 0.5 % Apply topically 3 (three) times daily. rash (Patient taking differently: Apply 1 application topically 3 (three) times daily as needed (for rash). ) 60 g 3  . Vitamin D, Ergocalciferol, (DRISDOL) 50000 UNITS  CAPS capsule TAKE ONE CAPSULE BY MOUTH ONCE A WEEK 10 capsule 0   Current Facility-Administered Medications on File Prior to Visit  Medication Dose Route Frequency Provider Last Rate Last Dose  . betamethasone acetate-betamethasone sodium phosphate (CELESTONE) injection 3 mg  3 mg Intramuscular Once Daylene Katayama M, DPM      . betamethasone acetate-betamethasone sodium phosphate (CELESTONE) injection 3 mg  3 mg Intramuscular Once Edrick Kins, DPM       Review of Systems  Constitutional: Negative for other unusual diaphoresis or sweats HENT: Negative for ear discharge or swelling Eyes: Negative for other worsening visual disturbances Respiratory: Negative for stridor or other swelling  Gastrointestinal: Negative for worsening distension or other blood Genitourinary: Negative for retention or other urinary change Musculoskeletal: Negative for other MSK pain or swelling Skin: Negative for color change or other new lesions Neurological: Negative for worsening tremors and other numbness  Psychiatric/Behavioral: Negative for worsening agitation or other fatigue All other system neg per pt    Objective:   Physical Exam BP 138/90   Pulse 100   Ht 5\' 6"  (1.676 m)   Wt (!) 327 lb (148.3 kg)   SpO2 99%   BMI 52.78 kg/m  VS noted,  Constitutional: Pt appears in NAD HENT: Head: NCAT.  Right Ear: External ear normal.  Left Ear: External ear normal.  Eyes: . Pupils are equal, round, and reactive to light. Conjunctivae and EOM are normal Bilat tm's with mild erythema.  Max sinus areas non tender.  Pharynx with mild erythema, no exudate Nose: without d/c or deformity Neck: Neck supple. Gross normal ROM Cardiovascular: Normal rate and regular rhythm.   Pulmonary/Chest: Effort normal and breath sounds without rales or wheezing.  Abd:  Soft, NT, ND, + BS, no organomegaly Bilat knees with crepitus and small efusions Neurological: Pt is alert. At baseline orientation, motor grossly  intact Skin: Skin is warm. No rashes, other new lesions, no LE edema Psychiatric: Pt behavior is normal without agitation  No other exam findings Lab Results  Component Value Date   WBC 8.1 09/12/2015   HGB 11.5 (L) 09/12/2015   HCT 34.3 (L) 09/12/2015   PLT 223 09/12/2015   GLUCOSE 101 (H) 09/12/2015   CHOL 194 09/28/2014   TRIG 79.0 09/28/2014   HDL 68.80 09/28/2014   LDLDIRECT 144.0 06/14/2010   LDLCALC 109 (H) 09/28/2014   ALT 44 09/12/2015   AST 92 (H) 09/12/2015   NA 139 09/12/2015   K 3.5 09/12/2015   CL 108 09/12/2015   CREATININE 0.66 09/12/2015   BUN 9 09/12/2015   CO2 24 09/12/2015   TSH 2.91 09/28/2014   HGBA1C 5.4 09/28/2014       Assessment & Plan:

## 2016-10-31 NOTE — Assessment & Plan Note (Addendum)
Suspect DJD related, for volt gel prn Pt delcines) , and refer sport med in this office

## 2016-10-31 NOTE — Patient Instructions (Signed)
You had the steroid shot today  Please take all new medication as prescribed - the prednisone  Please continue all other medications as before, and refills have been done if requested.  Please have the pharmacy call with any other refills you may need.  Please keep your appointments with your specialists as you may have planned  Please see Dr Alain Marion if not improved in 1-2 wks or if any worsening symptoms  Please consider seeing Dr Tamala Julian (sports medicine) in this office for the knees by making an appt at the desk as you checkout

## 2016-11-14 ENCOUNTER — Encounter: Payer: Self-pay | Admitting: Internal Medicine

## 2016-11-22 DIAGNOSIS — M545 Low back pain: Secondary | ICD-10-CM | POA: Diagnosis not present

## 2016-11-23 ENCOUNTER — Encounter (HOSPITAL_COMMUNITY): Payer: Self-pay | Admitting: Emergency Medicine

## 2016-11-23 ENCOUNTER — Ambulatory Visit (HOSPITAL_COMMUNITY)
Admission: EM | Admit: 2016-11-23 | Discharge: 2016-11-23 | Disposition: A | Discharge status: Home / Self Care | Payer: BLUE CROSS/BLUE SHIELD | Attending: Family Medicine | Admitting: Family Medicine

## 2016-11-23 DIAGNOSIS — R109 Unspecified abdominal pain: Secondary | ICD-10-CM

## 2016-11-23 DIAGNOSIS — R1033 Periumbilical pain: Secondary | ICD-10-CM | POA: Diagnosis not present

## 2016-11-23 MED ORDER — SULFAMETHOXAZOLE-TRIMETHOPRIM 800-160 MG PO TABS: 1.0000 | ORAL_TABLET | Freq: Two times a day (BID) | ORAL | 0 refills | Status: AC

## 2016-11-23 NOTE — ED Triage Notes (Signed)
Pt c/o intermittent RUQ onset this am associated w/naval bleeding and discharge that has a smell to it, HAD  Hx of Cholecystectomy   Denies fevers, chills  A&O x4... NAD... Ambulatory

## 2016-11-25 NOTE — ED Provider Notes (Signed)
[id number] 11/23/16 Arrival Time: 1201  ASSESSMENT & PLAN:  Abdominal discomfort Bleeding from navel  No abscess appreciated in or around navel. But question early skin infection. Some dried blood. Benign abdominal exam except for slight tenderness around navel. Will treat with Bactrim. Monitor very closely.   Reviewed expectations re: course of current medical issues.  Discussed self-management of symptoms.  Outlined signs and symptoms indicating need for more acute intervention. Follow up here or Emergency Department if worsening.  Patient verbalized understanding. Questions answered.  After Visit Summary given.  Meds ordered this encounter  Medications  . sulfamethoxazole-trimethoprim (BACTRIM DS,SEPTRA DS) 800-160 MG tablet    Sig: Take 1 tablet by mouth 2 (two) times daily.    Dispense:  20 tablet    Refill:  0     SUBJECTIVE:  Charlotte Meyer is a 51 y.o. female who presents with complaint of finding blood in navel when bathing today. Positive odor and slight discharge initially. Afebrile. Mild abdominal discomfort around navel. No injury. No active bleeding reported. Symptoms not worsening. No self tx.  ROS: As per HPI.   OBJECTIVE:  Vitals:   11/23/16 1243  BP: 135/80  Pulse: 78  Resp: 20  Temp: 98.5 F (36.9 C)  TempSrc: Oral  SpO2: 100%     General appearance: alert, cooperative, appears stated age and no distress Back: symmetric, no curvature. ROM normal. No CVA tenderness. Lungs: clear to auscultation bilaterally Heart: regular rate and rhythm Abdomen: soft, bowel sounds normal; no masses,  no organomegaly; small amount of dried blood in navel with tenderness reported during exam; no guarding or rebound tenderness; no erythema surrounding navel Lymph nodes: no lymphadenopathy Neurologic: Alert and oriented X 3   Labs Reviewed - No data to display    Allergies  Allergen Reactions  . Penicillins     Yeast infections Has patient had a PCN  reaction causing immediate rash, facial/tongue/throat swelling, SOB or lightheadedness with hypotension: No Has patient had a PCN reaction causing severe rash involving mucus membranes or skin necrosis: No Has patient had a PCN reaction that required hospitalization No Has patient had a PCN reaction occurring within the last 10 years: No If all of the above answers are "NO", then may proceed with Cephalosporin use.     PMHx, SurgHx, SocialHx, Medications, and Allergies were reviewed in the Visit Navigator and updated as appropriate.       Vanessa Kick, MD 11/25/16 [phone]

## 2016-11-27 ENCOUNTER — Ambulatory Visit (INDEPENDENT_AMBULATORY_CARE_PROVIDER_SITE_OTHER): Payer: BLUE CROSS/BLUE SHIELD | Admitting: Internal Medicine

## 2016-11-27 ENCOUNTER — Encounter: Payer: Self-pay | Admitting: Internal Medicine

## 2016-11-27 DIAGNOSIS — L03316 Cellulitis of umbilicus: Secondary | ICD-10-CM | POA: Insufficient documentation

## 2016-11-27 MED ORDER — FLUCONAZOLE 100 MG PO TABS: 100.0000 mg | ORAL_TABLET | Freq: Every day | ORAL | 0 refills | Status: DC

## 2016-11-27 NOTE — Progress Notes (Signed)
Subjective:    Patient ID: Charlotte Meyer, female    DOB: 01-25-1966, 51 y.o.   MRN: [id number]  HPI  Here with c/o bad smell, oozing and small blood and discomfort to umbilicus; started x 5 days, seemed to come on quickly and was seen at [facility] - tx with bactrim for cellulitis.  Since then has improved somewhat with less redness and swelling, but still with bad smell and d/c with mild periumbilical discomfort.  She is concerned that maybe somehow this is related to her [place] in April 2017.  Denies worsening reflux, abd pain, dysphagia, n/v, bowel change or blood. Denies urinary symptoms such as dysuria, frequency, urgency, flank pain, hematuria or n/v, fever, chills.   Past Medical History:  Diagnosis Date  . Allergic rhinitis   . Anemia   . Depression    treated with zoloft in past  . GERD (gastroesophageal reflux disease)   . History of UTI   . Hyperlipemia    no treatment required  . Lactose intolerance   . OA (osteoarthritis) of knee    bilaterally   . Obesity    Past Surgical History:  Procedure Laterality Date  . CHOLECYSTECTOMY N/A 09/12/2015   Procedure: LAPAROSCOPIC CHOLECYSTECTOMY WITH INTRAOPERATIVE CHOLANGIOGRAM;  Surgeon: Jackolyn Confer, MD;  Location: [facility];  Service: General;  Laterality: N/A;  . COLONOSCOPY WITH PROPOFOL N/A 03/08/2016   Procedure: COLONOSCOPY WITH PROPOFOL;  Surgeon: Otis Brace, MD;  Location: [place];  Service: Gastroenterology;  Laterality: N/A;  . FOOT SURGERY      reports that she has quit smoking. She has never used smokeless tobacco. She reports that she does not drink alcohol or use drugs. family history includes Cancer in her father; Cholelithiasis in her other; Diabetes in her other. Allergies  Allergen Reactions  . Penicillins     Yeast infections Has patient had a PCN reaction causing immediate rash, facial/tongue/throat swelling, SOB or lightheadedness with hypotension: No Has patient had a PCN reaction causing severe rash  involving mucus membranes or skin necrosis: No Has patient had a PCN reaction that required hospitalization No Has patient had a PCN reaction occurring within the last 10 years: No If all of the above answers are "NO", then may proceed with Cephalosporin use.    Current Outpatient Prescriptions on File Prior to Visit  Medication Sig Dispense Refill  . fexofenadine-pseudoephedrine (ALLEGRA-D 24) 180-240 MG per 24 hr tablet Take 1 tablet by mouth daily as needed. 30 tablet 1  . folic acid (FOLVITE) 1 MG tablet Take 1 mg by mouth daily.  5  . gabapentin (NEURONTIN) 300 MG capsule Take 300 mg by mouth at bedtime. Takes as needed  1  . hydrochlorothiazide (MICROZIDE) 12.5 MG capsule Take 12.5 mg by mouth daily as needed (fluid).   5  . HYDROcodone-acetaminophen (NORCO/VICODIN) 5-325 MG tablet Take 1-2 tablets by mouth every 6 (six) hours as needed for moderate pain or severe pain. 8 tablet 0  . ibuprofen (ADVIL,MOTRIN) 200 MG tablet Take 400 mg by mouth every 6 (six) hours as needed for headache or moderate pain.    Marland Kitchen ipratropium (ATROVENT) 0.06 % nasal spray Place 2 sprays into both nostrils 4 (four) times daily. 15 mL 1  . methocarbamol (ROBAXIN) 500 MG tablet Take 2 tablets (1,000 mg total) by mouth every 8 (eight) hours as needed for muscle spasms. 40 tablet 0  . oxyCODONE-acetaminophen (PERCOCET/ROXICET) 5-325 MG tablet Take 1-2 tablets by mouth every 6 (six) hours as needed  for moderate pain or severe pain. 40 tablet 0  . pantoprazole (PROTONIX) 40 MG tablet Take 1 tablet (40 mg total) by mouth daily. 90 tablet 0  . sulfamethoxazole-trimethoprim (BACTRIM DS,SEPTRA DS) 800-160 MG tablet Take 1 tablet by mouth 2 (two) times daily. 20 tablet 0  . triamcinolone cream (KENALOG) 0.5 % Apply topically 3 (three) times daily. rash (Patient taking differently: Apply 1 application topically 3 (three) times daily as needed (for rash). ) 60 g 3  . Vitamin D, Ergocalciferol, (DRISDOL) 50000 UNITS CAPS capsule  TAKE ONE CAPSULE BY MOUTH ONCE A WEEK 10 capsule 0   Current Facility-Administered Medications on File Prior to Visit  Medication Dose Route Frequency Provider Last Rate Last Dose  . betamethasone acetate-betamethasone sodium phosphate (CELESTONE) injection 3 mg  3 mg Intramuscular Once Daylene Katayama M, DPM      . betamethasone acetate-betamethasone sodium phosphate (CELESTONE) injection 3 mg  3 mg Intramuscular Once Edrick Kins, DPM       Review of Systems All otherwise neg per pt    Objective:   Physical Exam BP 126/88   Pulse 88   Ht 5\' 6"  (1.676 m)   Wt (!) 326 lb (147.9 kg)   LMP 11/23/2016   SpO2 100%   BMI 52.62 kg/m  VS noted,  Constitutional: Pt appears in NAD HENT: Head: NCAT.  Right Ear: External ear normal.  Left Ear: External ear normal.  Eyes: . Pupils are equal, round, and reactive to light. Conjunctivae and EOM are normal Nose: without d/c or deformity Neck: Neck supple. Gross normal ROM Cardiovascular: Normal rate and regular rhythm.   Pulmonary/Chest: Effort normal and breath sounds without rales or wheezing.  Abd:  Soft, NT, ND, + BS, no organomegaly except for umbilicus with cloudy near watery d/c at the base with mild erythema with no other significant swelling or tenderness Neurological: Pt is alert. At baseline orientation, motor grossly intact Skin: Skin is warm. No rashes, other new lesions, no LE edema Psychiatric: Pt behavior is normal without agitation  No other exam findings    Assessment & Plan:

## 2016-11-27 NOTE — Assessment & Plan Note (Signed)
Mild, ? Fungal involvement, to start diflucan and topical lotrimin asd, to cont bactrim as already rx

## 2016-11-27 NOTE — Patient Instructions (Signed)
Please take all new medication as prescribed - the diflucan 100 mg daily for 7 days  OK to also try the OTC Lotrimin cream twice per day as well  Please continue all other medications as before, and refills have been done if requested.  Please have the pharmacy call with any other refills you may need.  Please keep your appointments with your specialists as you may have planned  Please see you PCP if not improved, as you may need CT scan

## 2016-12-06 ENCOUNTER — Other Ambulatory Visit: Payer: BLUE CROSS/BLUE SHIELD

## 2016-12-06 ENCOUNTER — Encounter: Payer: Self-pay | Admitting: Internal Medicine

## 2016-12-06 ENCOUNTER — Ambulatory Visit (INDEPENDENT_AMBULATORY_CARE_PROVIDER_SITE_OTHER): Payer: BLUE CROSS/BLUE SHIELD | Admitting: Internal Medicine

## 2016-12-06 VITALS — BP 122/78 | HR 88 | Temp 98.4°F | Ht 66.0 in | Wt 328.0 lb

## 2016-12-06 DIAGNOSIS — E538 Deficiency of other specified B group vitamins: Secondary | ICD-10-CM | POA: Diagnosis not present

## 2016-12-06 DIAGNOSIS — G5700 Lesion of sciatic nerve, unspecified lower limb: Secondary | ICD-10-CM | POA: Insufficient documentation

## 2016-12-06 DIAGNOSIS — Z23 Encounter for immunization: Secondary | ICD-10-CM | POA: Diagnosis not present

## 2016-12-06 DIAGNOSIS — E559 Vitamin D deficiency, unspecified: Secondary | ICD-10-CM

## 2016-12-06 DIAGNOSIS — G5702 Lesion of sciatic nerve, left lower limb: Secondary | ICD-10-CM | POA: Diagnosis not present

## 2016-12-06 DIAGNOSIS — R252 Cramp and spasm: Secondary | ICD-10-CM | POA: Insufficient documentation

## 2016-12-06 DIAGNOSIS — Z6841 Body Mass Index (BMI) 40.0 and over, adult: Secondary | ICD-10-CM

## 2016-12-06 DIAGNOSIS — Z0000 Encounter for general adult medical examination without abnormal findings: Secondary | ICD-10-CM

## 2016-12-06 DIAGNOSIS — M25562 Pain in left knee: Secondary | ICD-10-CM | POA: Diagnosis not present

## 2016-12-06 MED ORDER — HYDROCODONE-ACETAMINOPHEN 5-325 MG PO TABS
1.0000 | ORAL_TABLET | Freq: Three times a day (TID) | ORAL | 0 refills | Status: DC | PRN
Start: 2016-12-06 — End: 2017-01-31

## 2016-12-06 MED ORDER — TRIAMCINOLONE ACETONIDE 0.5 % EX CREA: 1.0000 "application " | TOPICAL_CREAM | Freq: Three times a day (TID) | CUTANEOUS | 1 refills | Status: DC | PRN

## 2016-12-06 NOTE — Addendum Note (Signed)
Addended by: Karren Cobble on: 12/06/2016 04:54 PM   Modules accepted: Orders

## 2016-12-06 NOTE — Assessment & Plan Note (Signed)
Labs

## 2016-12-06 NOTE — Assessment & Plan Note (Addendum)
We discussed age appropriate health related issues, including available/recomended screening tests and vaccinations. We discussed a need for adhering to healthy diet and exercise. Labs were ordered to be later reviewed . All questions were answered. Colon due 2020-2022, last 2017 - polyp PAP Eye exam

## 2016-12-06 NOTE — Patient Instructions (Addendum)
Try Tylenol PM 1-2 tabs for cramps Tonic water   Piriformis Syndrome Rehab Ask your health care provider which exercises are safe for you. Do exercises exactly as told by your health care provider and adjust them as directed. It is normal to feel mild stretching, pulling, tightness, or discomfort as you do these exercises, but you should stop right away if you feel sudden pain or your pain gets worse.Do not begin these exercises until told by your health care provider. Stretching and range of motion exercises These exercises warm up your muscles and joints and improve the movement and flexibility of your hip and pelvis. These exercises also help to relieve pain, numbness, and tingling. Exercise A: Hip rotators  1. Lie on your back on a firm surface. 2. Pull your left / right knee toward your same shoulder with your left / right hand until your knee is pointing toward the ceiling. Hold your left / right ankle with your other hand. 3. Keeping your knee steady, gently pull your left / right ankle toward your other shoulder until you feel a stretch in your buttocks. 4. Hold this position for __________ seconds. Repeat __________ times. Complete this stretch __________ times a day. Exercise B: Hip extensors 1. Lie on your back on a firm surface. Both of your legs should be straight. 2. Pull your left / right knee to your chest. Hold your leg in this position by holding onto the back of your thigh or the front of your knee. 3. Hold this position for __________ seconds. 4. Slowly return to the starting position. Repeat __________ times. Complete this stretch __________ times a day. Strengthening exercises These exercises build strength and endurance in your hip and thigh muscles. Endurance is the ability to use your muscles for a long time, even after they get tired. Exercise C: Straight leg raises ( hip abductors) 1. Lie on your side with your left / right leg in the top position. Lie so your  head, shoulder, knee, and hip line up. Bend your bottom knee to help you balance. 2. Lift your top leg up 4-6 inches (10-15 cm), keeping your toes pointed straight ahead. 3. Hold this position for __________ seconds. 4. Slowly lower your leg to the starting position. Let your muscles relax completely. Repeat __________ times. Complete this exercise__________ times a day. Exercise D: Hip abductors and rotators, quadruped  1. Get on your hands and knees on a firm, lightly padded surface. Your hands should be directly below your shoulders, and your knees should be directly below your hips. 2. Lift your left / right knee out to the side. Keep your knee bent. Do not twist your body. 3. Hold this position for __________ seconds. 4. Slowly lower your leg. Repeat __________ times. Complete this exercise__________ times a day. Exercise E: Straight leg raises ( hip extensors) 1. Lie on your abdomen on a bed or a firm surface with a pillow under your hips. 2. Squeeze your buttock muscles and lift your left / right thigh off the bed. Do not let your back arch. 3. Hold this position for __________ seconds. 4. Slowly return to the starting position. Let your muscles relax completely before doing another repetition. Repeat __________ times. Complete this exercise__________ times a day. This information is not intended to replace advice given to you by your health care provider. Make sure you discuss any questions you have with your health care provider. Document Released: 05/27/2005 Document Revised: 01/30/2016 Document Reviewed: 05/09/2015 Elsevier Interactive Patient  Education  2018 [employer]  

## 2016-12-06 NOTE — Assessment & Plan Note (Signed)
Stretch  

## 2016-12-06 NOTE — Assessment & Plan Note (Signed)
Tylenol PM Labs
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
## 2016-12-06 NOTE — Assessment & Plan Note (Signed)
Ref to Dr Leafy Ro discussed

## 2016-12-12 ENCOUNTER — Other Ambulatory Visit (INDEPENDENT_AMBULATORY_CARE_PROVIDER_SITE_OTHER): Payer: BLUE CROSS/BLUE SHIELD

## 2016-12-12 DIAGNOSIS — Z0000 Encounter for general adult medical examination without abnormal findings: Secondary | ICD-10-CM | POA: Diagnosis not present

## 2016-12-12 DIAGNOSIS — E538 Deficiency of other specified B group vitamins: Secondary | ICD-10-CM

## 2016-12-12 DIAGNOSIS — E559 Vitamin D deficiency, unspecified: Secondary | ICD-10-CM

## 2016-12-12 LAB — URINALYSIS, ROUTINE W REFLEX MICROSCOPIC
BILIRUBIN URINE: NEGATIVE
Hgb urine dipstick: NEGATIVE
KETONES UR: NEGATIVE
LEUKOCYTES UA: NEGATIVE
NITRITE: NEGATIVE
PH: 7 (ref 5.0–8.0)
Specific Gravity, Urine: 1.02 (ref 1.000–1.030)
Total Protein, Urine: NEGATIVE
URINE GLUCOSE: NEGATIVE
UROBILINOGEN UA: 1 (ref 0.0–1.0)

## 2016-12-12 LAB — CBC WITH DIFFERENTIAL/PLATELET
BASOS PCT: 0.3 % (ref 0.0–3.0)
Basophils Absolute: 0 10*3/uL (ref 0.0–0.1)
EOS PCT: 2.3 % (ref 0.0–5.0)
Eosinophils Absolute: 0.1 10*3/uL (ref 0.0–0.7)
HCT: 34.7 % — ABNORMAL LOW (ref 36.0–46.0)
Hemoglobin: 11.5 g/dL — ABNORMAL LOW (ref 12.0–15.0)
Lymphocytes Relative: 38.7 % (ref 12.0–46.0)
Lymphs Abs: 2.4 10*3/uL (ref 0.7–4.0)
MCHC: 33.2 g/dL (ref 30.0–36.0)
MCV: 87.3 fl (ref 78.0–100.0)
MONO ABS: 0.5 10*3/uL (ref 0.1–1.0)
MONOS PCT: 8.9 % (ref 3.0–12.0)
NEUTROS ABS: 3 10*3/uL (ref 1.4–7.7)
NEUTROS PCT: 49.8 % (ref 43.0–77.0)
PLATELETS: 255 10*3/uL (ref 150.0–400.0)
RBC: 3.98 Mil/uL (ref 3.87–5.11)
RDW: 15.9 % — AB (ref 11.5–15.5)
WBC: 6.1 10*3/uL (ref 4.0–10.5)

## 2016-12-12 LAB — HEPATIC FUNCTION PANEL
ALT: 8 U/L (ref 0–35)
AST: 9 U/L (ref 0–37)
Albumin: 3.9 g/dL (ref 3.5–5.2)
Alkaline Phosphatase: 74 U/L (ref 39–117)
BILIRUBIN DIRECT: 0.1 mg/dL (ref 0.0–0.3)
BILIRUBIN TOTAL: 0.7 mg/dL (ref 0.2–1.2)
Total Protein: 7 g/dL (ref 6.0–8.3)

## 2016-12-12 LAB — LIPID PANEL
CHOL/HDL RATIO: 3
Cholesterol: 184 mg/dL (ref 0–200)
HDL: 72.6 mg/dL (ref >39.00)
LDL Cholesterol: 100 mg/dL — ABNORMAL HIGH (ref 0–99)
NONHDL: 111.19
Triglycerides: 56 mg/dL (ref 0.0–149.0)
VLDL: 11.2 mg/dL (ref 0.0–40.0)

## 2016-12-12 LAB — BASIC METABOLIC PANEL
BUN: 11 mg/dL (ref 6–23)
CALCIUM: 8.9 mg/dL (ref 8.4–10.5)
CO2: 26 meq/L (ref 19–32)
CREATININE: 0.71 mg/dL (ref 0.40–1.20)
Chloride: 105 mEq/L (ref 96–112)
GFR: 111.48 mL/min (ref >60.00)
GLUCOSE: 88 mg/dL (ref 70–99)
Potassium: 3.7 mEq/L (ref 3.5–5.1)
Sodium: 138 mEq/L (ref 135–145)

## 2016-12-12 LAB — VITAMIN B12: Vitamin B-12: 317 pg/mL (ref 211–911)

## 2016-12-12 LAB — TSH: TSH: 2.34 u[IU]/mL (ref 0.35–4.50)

## 2016-12-12 LAB — VITAMIN D 25 HYDROXY (VIT D DEFICIENCY, FRACTURES): VITD: 36.84 ng/mL (ref 30.00–100.00)

## 2016-12-27 DIAGNOSIS — E785 Hyperlipidemia, unspecified: Secondary | ICD-10-CM | POA: Diagnosis not present

## 2016-12-27 DIAGNOSIS — F439 Reaction to severe stress, unspecified: Secondary | ICD-10-CM | POA: Diagnosis not present

## 2016-12-27 DIAGNOSIS — I10 Essential (primary) hypertension: Secondary | ICD-10-CM | POA: Diagnosis not present

## 2017-01-31 ENCOUNTER — Encounter: Payer: Self-pay | Admitting: Internal Medicine

## 2017-01-31 ENCOUNTER — Ambulatory Visit (INDEPENDENT_AMBULATORY_CARE_PROVIDER_SITE_OTHER): Payer: BLUE CROSS/BLUE SHIELD | Admitting: Internal Medicine

## 2017-01-31 VITALS — BP 138/78 | HR 99 | Temp 98.8°F | Resp 16 | Ht 66.0 in | Wt 328.0 lb

## 2017-01-31 DIAGNOSIS — M545 Low back pain, unspecified: Secondary | ICD-10-CM | POA: Insufficient documentation

## 2017-01-31 DIAGNOSIS — M542 Cervicalgia: Secondary | ICD-10-CM

## 2017-01-31 DIAGNOSIS — M25561 Pain in right knee: Secondary | ICD-10-CM | POA: Diagnosis not present

## 2017-01-31 DIAGNOSIS — N760 Acute vaginitis: Secondary | ICD-10-CM | POA: Insufficient documentation

## 2017-01-31 MED ORDER — HYDROCODONE-ACETAMINOPHEN 7.5-325 MG PO TABS: 1.0000 | ORAL_TABLET | Freq: Four times a day (QID) | ORAL | 0 refills | Status: DC | PRN

## 2017-01-31 MED ORDER — PREDNISONE 10 MG PO TABS: ORAL_TABLET | ORAL | 0 refills | Status: DC

## 2017-01-31 MED ORDER — KETOROLAC TROMETHAMINE 30 MG/ML IJ SOLN
30.0000 mg | Freq: Once | INTRAMUSCULAR | Status: AC
  Administered 2017-01-31: 30 mg via INTRAMUSCULAR

## 2017-01-31 MED ORDER — FLUCONAZOLE 150 MG PO TABS: ORAL_TABLET | ORAL | 1 refills | Status: DC

## 2017-01-31 MED ORDER — METHYLPREDNISOLONE ACETATE 80 MG/ML IJ SUSP
80.0000 mg | Freq: Once | INTRAMUSCULAR | Status: AC
  Administered 2017-01-31: 80 mg via INTRAMUSCULAR

## 2017-01-31 MED ORDER — CYCLOBENZAPRINE HCL 5 MG PO TABS: 5.0000 mg | ORAL_TABLET | Freq: Three times a day (TID) | ORAL | 1 refills | Status: DC | PRN

## 2017-01-31 NOTE — Progress Notes (Signed)
Subjective:    Patient ID: Charlotte Meyer, female    DOB: [birth year], 51 y.o.   MRN: [id number]  HPI  Here with c/o 1 wk neck, lower back and right knee pain.  Neck pain is low midline posterior, mild, constant, worst to extend and somewhat to put chin to chest, with some radiation to bilat upper back, but not assoc with UE pain, weakness or numbness.  Right knee pain is sharp, anteromedial, constant, mild to mod, worse to walk, better to sit, no falls but has sort of mildly given way several times with walking.  Her worst pain is to the lower back, sharp and pressure like across the lower back but maybe worse in the midline with some radiation to both buttocks only, without LE radicular pain, numbness or weakness. Nothing else seems to make better or worse.  No fever, rash.  Denies urinary symptoms such as dysuria, frequency, urgency, flank pain, hematuria or n/v, fever, chills.  Denies worsening reflux, abd pain, dysphagia, n/v, bowel change or blood. Past Medical History:  Diagnosis Date  . Allergic rhinitis   . Anemia   . Depression    treated with zoloft in past  . GERD (gastroesophageal reflux disease)   . History of UTI   . Hyperlipemia    no treatment required  . Lactose intolerance   . OA (osteoarthritis) of knee    bilaterally   . Obesity    Past Surgical History:  Procedure Laterality Date  . CHOLECYSTECTOMY N/A 09/12/2015   Procedure: LAPAROSCOPIC CHOLECYSTECTOMY WITH INTRAOPERATIVE CHOLANGIOGRAM;  Surgeon: Jackolyn Confer, MD;  Location: [facility];  Service: General;  Laterality: N/A;  . COLONOSCOPY WITH PROPOFOL N/A 03/08/2016   Procedure: COLONOSCOPY WITH PROPOFOL;  Surgeon: Otis Brace, MD;  Location: [facility];  Service: Gastroenterology;  Laterality: N/A;  . FOOT SURGERY      reports that she has quit smoking. She has never used smokeless tobacco. She reports that she does not drink alcohol or use drugs. family history includes Cancer in her father; Cholelithiasis  in her other; Diabetes in her other. Allergies  Allergen Reactions  . Penicillins     Yeast infections Has patient had a PCN reaction causing immediate rash, facial/tongue/throat swelling, SOB or lightheadedness with hypotension: No Has patient had a PCN reaction causing severe rash involving mucus membranes or skin necrosis: No Has patient had a PCN reaction that required hospitalization No Has patient had a PCN reaction occurring within the last 10 years: No If all of the above answers are "NO", then may proceed with Cephalosporin use.   . Tramadol Other (See Comments)    Loopy and confused   Current Outpatient Prescriptions on File Prior to Visit  Medication Sig Dispense Refill  . fexofenadine-pseudoephedrine (ALLEGRA-D 24) 180-240 MG per 24 hr tablet Take 1 tablet by mouth daily as needed. 30 tablet 1  . folic acid (FOLVITE) 1 MG tablet Take 1 mg by mouth daily.  5  . gabapentin (NEURONTIN) 300 MG capsule Take 300 mg by mouth at bedtime. Takes as needed  1  . ibuprofen (ADVIL,MOTRIN) 200 MG tablet Take 400 mg by mouth every 6 (six) hours as needed for headache or moderate pain.    Marland Kitchen ipratropium (ATROVENT) 0.06 % nasal spray Place 2 sprays into both nostrils 4 (four) times daily. 15 mL 1  . methocarbamol (ROBAXIN) 500 MG tablet Take 2 tablets (1,000 mg total) by mouth every 8 (eight) hours as needed for muscle spasms. 40 tablet  0  . pantoprazole (PROTONIX) 40 MG tablet Take 1 tablet (40 mg total) by mouth daily. 90 tablet 0  . triamcinolone cream (KENALOG) 0.5 % Apply 1 application topically 3 (three) times daily as needed (for rash). 60 g 1  . Vitamin D, Ergocalciferol, (DRISDOL) 50000 UNITS CAPS capsule TAKE ONE CAPSULE BY MOUTH ONCE A WEEK 10 capsule 0   Current Facility-Administered Medications on File Prior to Visit  Medication Dose Route Frequency Provider Last Rate Last Dose  . betamethasone acetate-betamethasone sodium phosphate (CELESTONE) injection 3 mg  3 mg Intramuscular  Once Daylene Katayama M, DPM      . betamethasone acetate-betamethasone sodium phosphate (CELESTONE) injection 3 mg  3 mg Intramuscular Once Edrick Kins, DPM       Review of Systems  Constitutional: Negative for other unusual diaphoresis or sweats HENT: Negative for ear discharge or swelling Eyes: Negative for other worsening visual disturbances Respiratory: Negative for stridor or other swelling  Gastrointestinal: Negative for worsening distension or other blood Genitourinary: Negative for retention or other urinary change Musculoskeletal: Negative for other MSK pain or swelling Skin: Negative for color change or other new lesions Neurological: Negative for worsening tremors and other numbness  Psychiatric/Behavioral: Negative for worsening agitation or other fatigue All other system neg per pt    Objective:   Physical Exam BP 138/78 (BP Location: Left Arm, Patient Position: Sitting, Cuff Size: Large)   Pulse 99   Temp 98.8 F (37.1 C) (Oral)   Resp 16   Ht 5\' 6"  (1.676 m)   Wt (!) 328 lb (148.8 kg)   SpO2 97%   BMI 52.94 kg/m  VS noted,  Constitutional: Pt appears in NAD HENT: Head: NCAT.  Right Ear: External ear normal.  Left Ear: External ear normal.  Eyes: . Pupils are equal, round, and reactive to light. Conjunctivae and EOM are normal Nose: without d/c or deformity Neck: Neck supple. Gross normal ROM; + midline tender at lowest cervical mild with bilat upper back/trapezoid tender and spasn Cardiovascular: Normal rate and regular rhythm.   Pulmonary/Chest: Effort normal and breath sounds without rales or wheezing.  Abd:  Soft, NT, ND, + BS, no organomegaly Spine: lumbar with marked mod to severe tender diffusely possible worse at the lower aspect and includes marked discomfort to the bilat lumbar paravertebral areas Right knee with mild warmth to anteromedial knee with mild tenderness at the medial joint line, FROM, no effusion Neurological: Pt is alert. At baseline  orientation, motor 5/5 intact, sens intact Skin: Skin is warm. No rashes, other new lesions, no LE edema Psychiatric: Pt behavior is normal without agitation  No other exam findings Lab Results  Component Value Date   WBC 6.1 12/12/2016   HGB 11.5 (L) 12/12/2016   HCT 34.7 (L) 12/12/2016   PLT 255.0 12/12/2016   GLUCOSE 88 12/12/2016   CHOL 184 12/12/2016   TRIG 56.0 12/12/2016   HDL 72.60 12/12/2016   LDLDIRECT 144.0 06/14/2010   LDLCALC 100 (H) 12/12/2016   ALT 8 12/12/2016   AST 9 12/12/2016   NA 138 12/12/2016   K 3.7 12/12/2016   CL 105 12/12/2016   CREATININE 0.71 12/12/2016   BUN 11 12/12/2016   CO2 26 12/12/2016   TSH 2.34 12/12/2016   HGBA1C 5.4 09/28/2014      Assessment & Plan:

## 2017-01-31 NOTE — Assessment & Plan Note (Addendum)
Severe x 1 wk without neuro change, for pain control, predpac asd, depomedrol IM 80, toradol 30 IM, and muscle relaxer as above  Note:  Total time for pt hx, exam, review of record with pt in the room, determination of diagnoses and plan for further eval and tx is > 40 min, with over 50% spent in coordination and counseling of patient, including differential dx, tx, further evaluation and management of neck pain, lower back pain, and right knee pain

## 2017-01-31 NOTE — Assessment & Plan Note (Signed)
Exam c/w DJD flare +/- meniscal tear medially, for pain control, cont to monitor, consider sport medicine if not improved

## 2017-01-31 NOTE — Assessment & Plan Note (Signed)
I suspect underlying c spine djd/ddd with mild to mod bilat paravertebral spasm, for pain control, muscle relaxer prn,  to f/u any worsening symptoms or concerns

## 2017-01-31 NOTE — Patient Instructions (Addendum)
You had the steroid shot and pain shot today  Please take all new medication as prescribed - the pain medication, muscle relaxer as needed, and the prednisone, and the diflucan  Please continue all other medications as before, and refills have been done if requested.  Please have the pharmacy call with any other refills you may need.  Please keep your appointments with your specialists as you may have planned

## 2017-06-13 ENCOUNTER — Encounter: Payer: Self-pay | Admitting: Family Medicine

## 2017-06-13 ENCOUNTER — Ambulatory Visit: Payer: BLUE CROSS/BLUE SHIELD | Admitting: Nurse Practitioner

## 2017-06-13 ENCOUNTER — Ambulatory Visit: Payer: BLUE CROSS/BLUE SHIELD | Admitting: Family Medicine

## 2017-06-13 VITALS — BP 118/80 | HR 102 | Ht 66.0 in | Wt 338.5 lb

## 2017-06-13 DIAGNOSIS — M25561 Pain in right knee: Secondary | ICD-10-CM | POA: Diagnosis not present

## 2017-06-13 DIAGNOSIS — M545 Low back pain, unspecified: Secondary | ICD-10-CM

## 2017-06-13 DIAGNOSIS — M549 Dorsalgia, unspecified: Secondary | ICD-10-CM | POA: Insufficient documentation

## 2017-06-13 DIAGNOSIS — M25562 Pain in left knee: Secondary | ICD-10-CM

## 2017-06-13 MED ORDER — CELECOXIB 200 MG PO CAPS
200.0000 mg | ORAL_CAPSULE | Freq: Every day | ORAL | 0 refills | Status: DC
Start: 2017-06-13 — End: 2017-12-21

## 2017-06-13 MED ORDER — KETOROLAC TROMETHAMINE 60 MG/2ML IM SOLN
60.0000 mg | Freq: Once | INTRAMUSCULAR | Status: AC
  Administered 2017-06-13: 60 mg via INTRAMUSCULAR

## 2017-06-13 NOTE — Patient Instructions (Addendum)
Back Exercises If you have pain in your back, do these exercises 2-3 times each day or as told by your doctor. When the pain goes away, do the exercises once each day, but repeat the steps more times for each exercise (do more repetitions). If you do not have pain in your back, do these exercises once each day or as told by your doctor. Exercises Single Knee to Chest  Do these steps 3-5 times in a row for each leg: 1. Lie on your back on a firm bed or the floor with your legs stretched out. 2. Bring one knee to your chest. 3. Hold your knee to your chest by grabbing your knee or thigh. 4. Pull on your knee until you feel a gentle stretch in your lower back. 5. Keep doing the stretch for 10-30 seconds. 6. Slowly let go of your leg and straighten it.  Pelvic Tilt  Do these steps 5-10 times in a row: 1. Lie on your back on a firm bed or the floor with your legs stretched out. 2. Bend your knees so they point up to the ceiling. Your feet should be flat on the floor. 3. Tighten your lower belly (abdomen) muscles to press your lower back against the floor. This will make your tailbone point up to the ceiling instead of pointing down to your feet or the floor. 4. Stay in this position for 5-10 seconds while you gently tighten your muscles and breathe evenly.  Cat-Cow  Do these steps until your lower back bends more easily: 1. Get on your hands and knees on a firm surface. Keep your hands under your shoulders, and keep your knees under your hips. You may put padding under your knees. 2. Let your head hang down, and make your tailbone point down to the floor so your lower back is round like the back of a cat. 3. Stay in this position for 5 seconds. 4. Slowly lift your head and make your tailbone point up to the ceiling so your back hangs low (sags) like the back of a cow. 5. Stay in this position for 5 seconds.  Press-Ups  Do these steps 5-10 times in a row: 1. Lie on your belly (face-down)  on the floor. 2. Place your hands near your head, about shoulder-width apart. 3. While you keep your back relaxed and keep your hips on the floor, slowly straighten your arms to raise the top half of your body and lift your shoulders. Do not use your back muscles. To make yourself more comfortable, you may change where you place your hands. 4. Stay in this position for 5 seconds. 5. Slowly return to lying flat on the floor.  Bridges  Do these steps 10 times in a row: 1. Lie on your back on a firm surface. 2. Bend your knees so they point up to the ceiling. Your feet should be flat on the floor. 3. Tighten your butt muscles and lift your butt off of the floor until your waist is almost as high as your knees. If you do not feel the muscles working in your butt and the back of your thighs, slide your feet 1-2 inches farther away from your butt. 4. Stay in this position for 3-5 seconds. 5. Slowly lower your butt to the floor, and let your butt muscles relax.  If this exercise is too easy, try doing it with your arms crossed over your chest. Belly Crunches  Do these steps 5-10 times in   a row: 1. Lie on your back on a firm bed or the floor with your legs stretched out. 2. Bend your knees so they point up to the ceiling. Your feet should be flat on the floor. 3. Cross your arms over your chest. 4. Tip your chin a little bit toward your chest but do not bend your neck. 5. Tighten your belly muscles and slowly raise your chest just enough to lift your shoulder blades a tiny bit off of the floor. 6. Slowly lower your chest and your head to the floor.  Back Lifts Do these steps 5-10 times in a row: 1. Lie on your belly (face-down) with your arms at your sides, and rest your forehead on the floor. 2. Tighten the muscles in your legs and your butt. 3. Slowly lift your chest off of the floor while you keep your hips on the floor. Keep the back of your head in line with the curve in your back. Look at  the floor while you do this. 4. Stay in this position for 3-5 seconds. 5. Slowly lower your chest and your face to the floor.  Contact a doctor if:  Your back pain gets a lot worse when you do an exercise.  Your back pain does not lessen 2 hours after you exercise. If you have any of these problems, stop doing the exercises. Do not do them again unless your doctor says it is okay. Get help right away if:  You have sudden, very bad back pain. If this happens, stop doing the exercises. Do not do them again unless your doctor says it is okay. This information is not intended to replace advice given to you by your health care provider. Make sure you discuss any questions you have with your health care provider. Document Released: 06/29/2010 Document Revised: 11/02/2015 Document Reviewed: 07/21/2014 Elsevier Interactive Patient Education  2018 [place].  Knee Pain, Adult Many things can cause knee pain. The pain often goes away on its own with time and rest. If the pain does not go away, tests may be done to find out what is causing the pain. Follow these instructions at home: Activity  Rest your knee.  Do not do things that cause pain.  Avoid activities where both feet leave the ground at the same time (high-impact activities). Examples are running, jumping rope, and doing jumping jacks. General instructions  Take medicines only as told by your doctor.  Raise (elevate) your knee when you are resting. Make sure your knee is higher than your heart.  Sleep with a pillow under your knee.  If told, put ice on the knee: ? Put ice in a plastic bag. ? Place a towel between your skin and the bag. ? Leave the ice on for 20 minutes, 2-3 times a day.  Ask your doctor if you should wear an elastic knee support.  Lose weight if you are overweight. Being overweight can make your knee hurt more.  Do not use any tobacco products. These include cigarettes, chewing tobacco, or electronic  cigarettes. If you need help quitting, ask your doctor. Smoking may slow down healing. Contact a doctor if:  The pain does not stop.  The pain changes or gets worse.  You have a fever along with knee pain.  Your knee gives out or locks up.  Your knee swells, and becomes worse. Get help right away if:  Your knee feels warm.  You cannot move your knee.  You have very  bad knee pain.  You have chest pain.  You have trouble breathing. Summary  Many things can cause knee pain. The pain often goes away on its own with time and rest.  Avoid activities that put stress on your knee. These include running and jumping rope.  Get help right away if you cannot move your knee, or if your knee feels warm, or if you have trouble breathing. This information is not intended to replace advice given to you by your health care provider. Make sure you discuss any questions you have with your health care provider. Document Released: 08/23/2008 Document Revised: 05/21/2016 Document Reviewed: 05/21/2016 Elsevier Interactive Patient Education  2017 [employer].

## 2017-06-13 NOTE — Progress Notes (Signed)
Subjective:  Patient ID: Charlotte Meyer, female    DOB: 07-08-1965  Age: 52 y.o. MRN: [id number]  CC: Back Pain and Leg Pain   HPI Charlotte Meyer presents for evaluation of a 2-3-week history of pain in her upper back, pain across her lower back, pain in her legs and bilateral knee pain.  She gives no injury history.  She gives a history of sciatica.  With this episode there is been no movement of the pain from her back into the back of her legs.  She does feel occasional muscle spasms.  She denies saddle paresthesias.  She tells of a standing history of urinary urgency but denies incontinence of urine or stool.  She has taken prednisone in the past.  She is currently taking Aleve only with some relief.  She has a standing prescription for Flexeril given to her by her primary doctor.  She has not been to physical therapy.  She was referred for weight management but says that the visit never materialized.  History Charlotte Meyer has a past medical history of Allergic rhinitis, Anemia, Depression, GERD (gastroesophageal reflux disease), History of UTI, Hyperlipemia, Lactose intolerance, OA (osteoarthritis) of knee, and Obesity.   She has a past surgical history that includes Foot surgery; Cholecystectomy (N/A, 09/12/2015); and Colonoscopy with propofol (N/A, 03/08/2016).   Her family history includes Cancer in her father; Cholelithiasis in her other; Diabetes in her other.She reports that she has quit smoking. she has never used smokeless tobacco. She reports that she does not drink alcohol or use drugs.  Outpatient Medications Prior to Visit  Medication Sig Dispense Refill  . cyclobenzaprine (FLEXERIL) 5 MG tablet Take 1 tablet (5 mg total) by mouth 3 (three) times daily as needed for muscle spasms. 30 tablet 1  . fexofenadine-pseudoephedrine (ALLEGRA-D 24) 180-240 MG per 24 hr tablet Take 1 tablet by mouth daily as needed. 30 tablet 1  . fluconazole (DIFLUCAN) 150 MG tablet 1 by mouth every 3 days as  needed 2 tablet 1  . folic acid (FOLVITE) 1 MG tablet Take 1 mg by mouth daily.  5  . gabapentin (NEURONTIN) 300 MG capsule Take 300 mg by mouth at bedtime. Takes as needed  1  . ibuprofen (ADVIL,MOTRIN) 200 MG tablet Take 400 mg by mouth every 6 (six) hours as needed for headache or moderate pain.    Marland Kitchen ipratropium (ATROVENT) 0.06 % nasal spray Place 2 sprays into both nostrils 4 (four) times daily. 15 mL 1  . pantoprazole (PROTONIX) 40 MG tablet Take 1 tablet (40 mg total) by mouth daily. 90 tablet 0  . predniSONE (DELTASONE) 10 MG tablet 3 tabs by mouth per day for 3 days,2tabs per day for 3 days,1tab per day for 3 days 18 tablet 0  . triamcinolone cream (KENALOG) 0.5 % Apply 1 application topically 3 (three) times daily as needed (for rash). 60 g 1  . Vitamin D, Ergocalciferol, (DRISDOL) 50000 UNITS CAPS capsule TAKE ONE CAPSULE BY MOUTH ONCE A WEEK 10 capsule 0  . HYDROcodone-acetaminophen (NORCO) 7.5-325 MG tablet Take 1 tablet by mouth every 6 (six) hours as needed for moderate pain. 30 tablet 0  . methocarbamol (ROBAXIN) 500 MG tablet Take 2 tablets (1,000 mg total) by mouth every 8 (eight) hours as needed for muscle spasms. 40 tablet 0   Facility-Administered Medications Prior to Visit  Medication Dose Route Frequency Provider Last Rate Last Dose  . betamethasone acetate-betamethasone sodium phosphate (CELESTONE) injection 3 mg  3 mg  Intramuscular Once Daylene Katayama M, DPM      . betamethasone acetate-betamethasone sodium phosphate (CELESTONE) injection 3 mg  3 mg Intramuscular Once Edrick Kins, DPM        ROS Review of Systems  Constitutional: Negative for diaphoresis, fatigue, fever and unexpected weight change.  Gastrointestinal: Negative.   Endocrine: Negative for polyphagia and polyuria.  Genitourinary: Negative.   Musculoskeletal: Positive for arthralgias, back pain, joint swelling and myalgias.  Neurological: Negative for weakness and numbness.  Hematological: Does not  bruise/bleed easily.  Psychiatric/Behavioral: Negative.     Objective:  BP 118/80 (BP Location: Left Arm, Patient Position: Sitting, Cuff Size: Large)   Pulse (!) 102   Ht 5\' 6"  (1.676 m)   Wt (!) 338 lb 8 oz (153.5 kg)   SpO2 99%   BMI 54.64 kg/m   Physical Exam  Constitutional: She appears well-developed and well-nourished. No distress.  HENT:  Head: Normocephalic and atraumatic.  Right Ear: External ear normal.  Left Ear: External ear normal.  Eyes: Conjunctivae are normal. Right eye exhibits no discharge. Left eye exhibits no discharge. No scleral icterus.  Pulmonary/Chest: Effort normal.  Musculoskeletal:       Thoracic back: She exhibits no bony tenderness and no swelling.       Lumbar back: She exhibits decreased range of motion (flexion to 12"), tenderness and bony tenderness. She exhibits no spasm.       Back:  Neurological: She has normal strength.  Reflex Scores:      Patellar reflexes are 1+ on the right side and 1+ on the left side.      Achilles reflexes are 1+ on the right side and 1+ on the left side. Negative dural tension signs.   Skin: She is not diaphoretic.      Assessment & Plan:   Charlotte Meyer was seen today for back pain and leg pain.  Diagnoses and all orders for this visit:  Arthralgia of both lower legs -     celecoxib (CELEBREX) 200 MG capsule; Take 1 capsule (200 mg total) by mouth daily. -     Ambulatory referral to Sports Medicine  Acute midline low back pain without sciatica -     celecoxib (CELEBREX) 200 MG capsule; Take 1 capsule (200 mg total) by mouth daily. -     Ambulatory referral to Sports Medicine  Acute upper back pain -     celecoxib (CELEBREX) 200 MG capsule; Take 1 capsule (200 mg total) by mouth daily. -     Ambulatory referral to Sports Medicine   I have discontinued Charlotte Meyer's methocarbamol and HYDROcodone-acetaminophen. I am also having her start on celecoxib. Additionally, I am having her maintain her  fexofenadine-pseudoephedrine, pantoprazole, Vitamin D (Ergocalciferol), folic acid, gabapentin, ibuprofen, ipratropium, triamcinolone cream, predniSONE, cyclobenzaprine, and fluconazole. We will continue to administer betamethasone acetate-betamethasone sodium phosphate and betamethasone acetate-betamethasone sodium phosphate.  Meds ordered this encounter  Medications  . celecoxib (CELEBREX) 200 MG capsule    Sig: Take 1 capsule (200 mg total) by mouth daily.    Dispense:  30 capsule    Refill:  0   I have recommended referral for sports medicine evaluation and treatment.  Recommended follow-up with her primary MD to revisit weight loss strategies.  She requested a muscle relaxer for daytime use but said that Robaxin did not work for her.  She has a standing prescription for Flexeril 10 mg but said that that causes drowsiness.  I suggested that she use  that at night.  Follow-up: No Follow-up on file.  Libby Maw, MD

## 2017-06-16 DIAGNOSIS — R198 Other specified symptoms and signs involving the digestive system and abdomen: Secondary | ICD-10-CM | POA: Diagnosis not present

## 2017-06-19 ENCOUNTER — Other Ambulatory Visit: Payer: Self-pay | Admitting: Surgery

## 2017-06-19 DIAGNOSIS — R198 Other specified symptoms and signs involving the digestive system and abdomen: Secondary | ICD-10-CM

## 2017-06-20 ENCOUNTER — Ambulatory Visit: Payer: BLUE CROSS/BLUE SHIELD | Admitting: Family Medicine

## 2017-06-20 DIAGNOSIS — Z0289 Encounter for other administrative examinations: Secondary | ICD-10-CM

## 2017-06-26 ENCOUNTER — Encounter: Payer: Self-pay | Admitting: Radiology

## 2017-06-27 ENCOUNTER — Ambulatory Visit
Admission: RE | Admit: 2017-06-27 | Discharge: 2017-06-27 | Disposition: A | Discharge status: Home / Self Care | Payer: BLUE CROSS/BLUE SHIELD | Source: Ambulatory Visit | Attending: Surgery | Admitting: Surgery

## 2017-06-27 DIAGNOSIS — R1084 Generalized abdominal pain: Secondary | ICD-10-CM | POA: Diagnosis not present

## 2017-06-27 DIAGNOSIS — R198 Other specified symptoms and signs involving the digestive system and abdomen: Secondary | ICD-10-CM

## 2017-06-27 MED ORDER — IOPAMIDOL (ISOVUE-300) INJECTION 61%
125.0000 mL | Freq: Once | INTRAVENOUS | Status: AC | PRN
  Administered 2017-06-27: 125 mL via INTRAVENOUS

## 2017-06-28 IMAGING — CT CT ABD-PELV W/ CM
1 of 4 series · 11 of 32 positions shown, 17 images · IV contrast (APPLIED)
Comparison: None.

CLINICAL DATA: Generalized abdominal pain, umbilical leakage.

EXAM:
CT ABDOMEN AND PELVIS WITH CONTRAST
TECHNIQUE: Multidetector CT imaging of the abdomen and pelvis was performed
using the standard protocol following bolus administration of
intravenous contrast.
CONTRAST:  125mL [O5] IOPAMIDOL ([O5]) INJECTION 61%

[Series 3: abd/pelvis w/cm · axial · 0.98mm/px · z∈[-468,-48]mm · 11 of 102 slices shown, 17 images]
[im 9/102  soft-tissue]
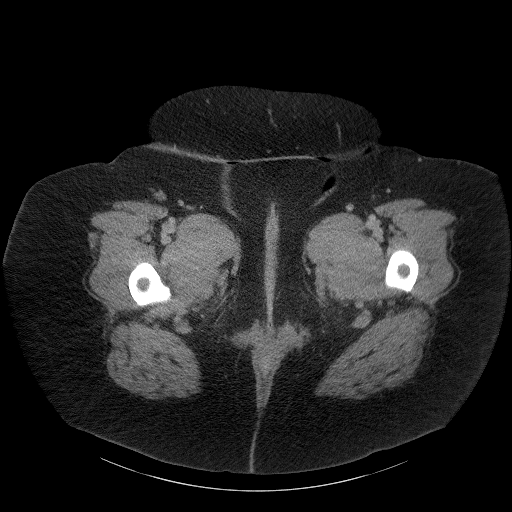
[im 9/102  bone]
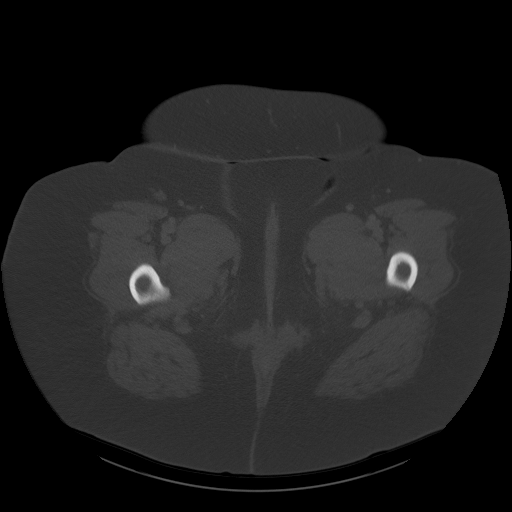
[im 17/102  soft-tissue]
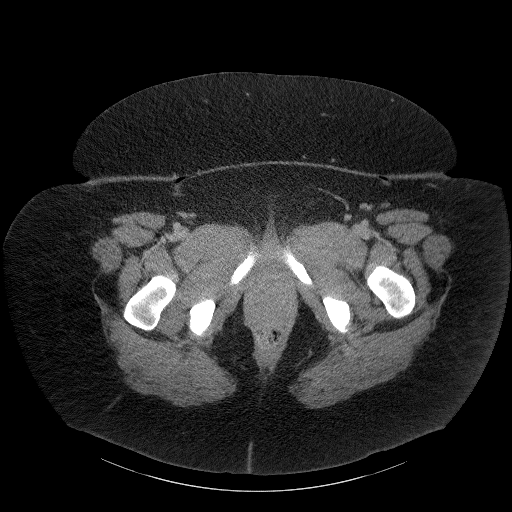
[im 26/102  soft-tissue]
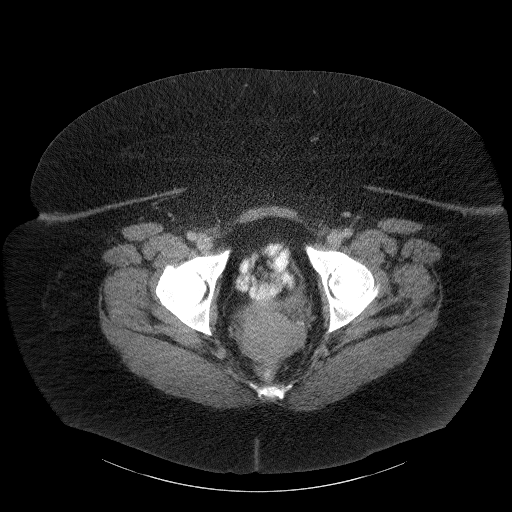
[im 34/102  soft-tissue]
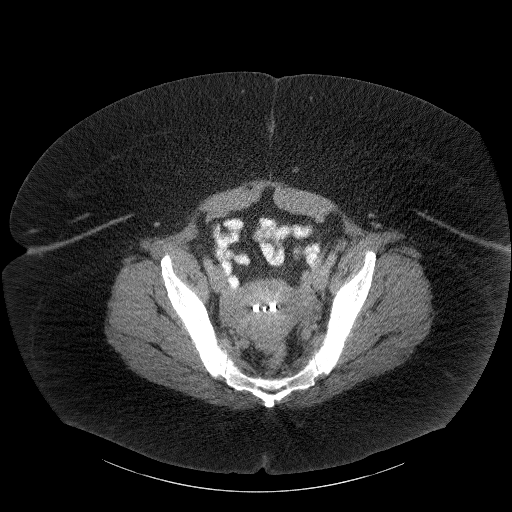
[im 43/102  soft-tissue]
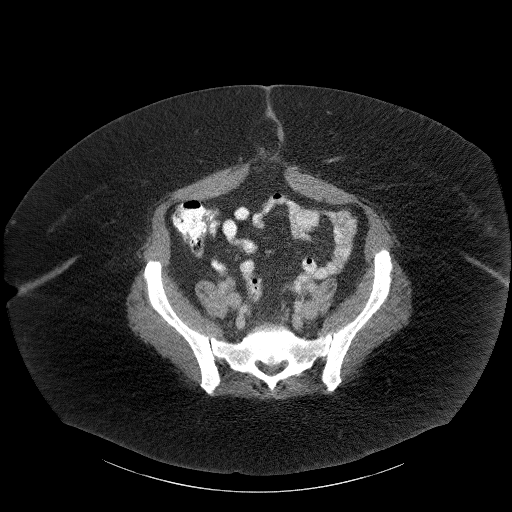
[im 51/102  soft-tissue]
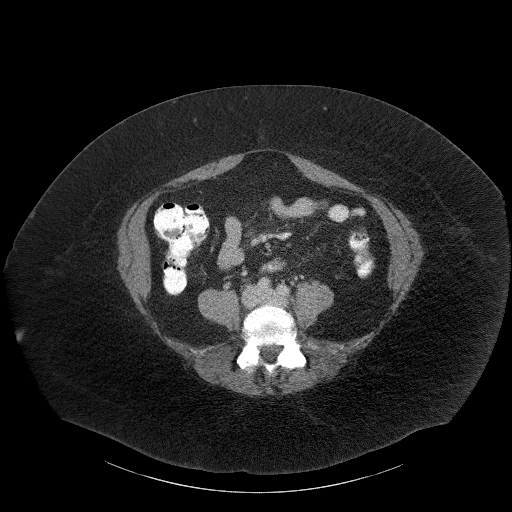
[im 59/102  soft-tissue]
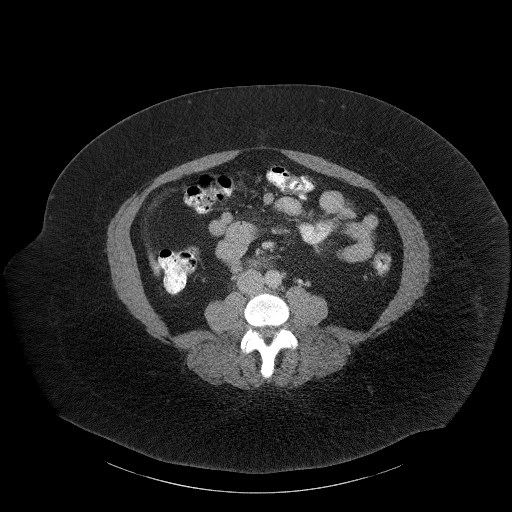
[im 68/102  soft-tissue]
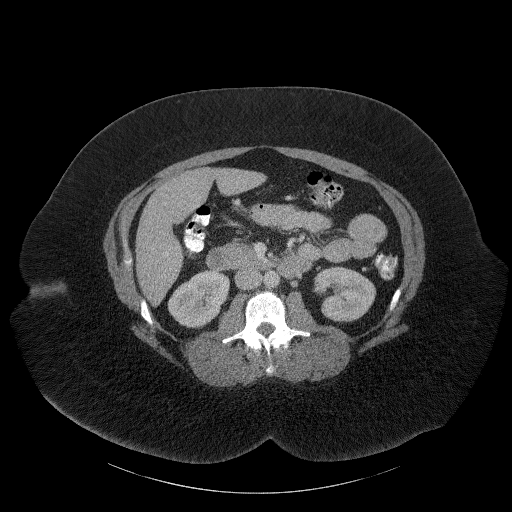
[im 68/102  lung]
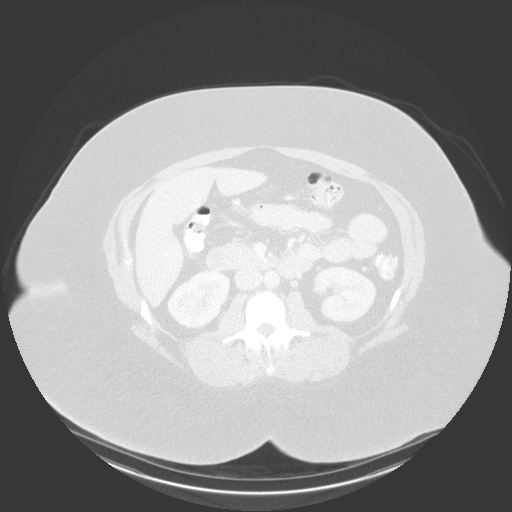
[im 76/102  soft-tissue]
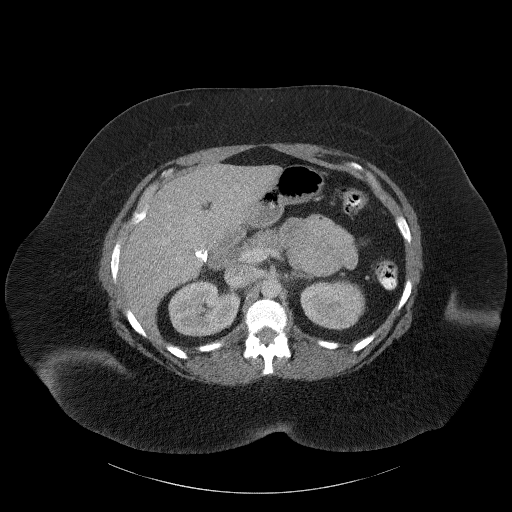
[im 76/102  lung]
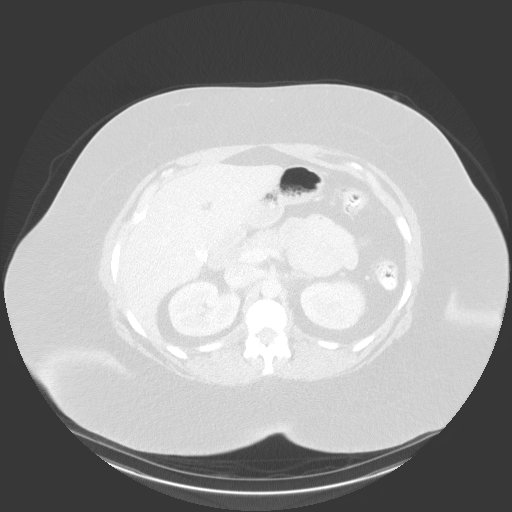
[im 76/102  bone]
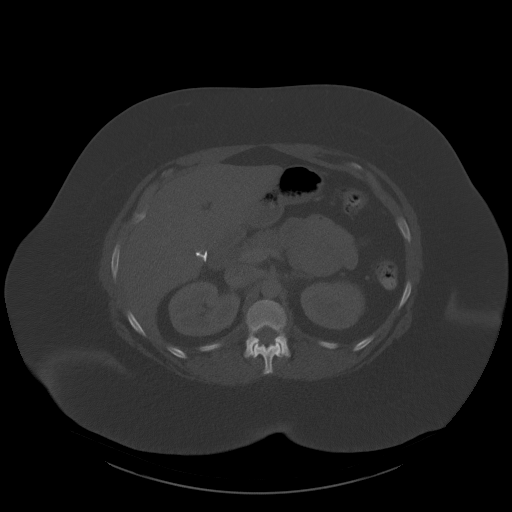
[im 85/102  soft-tissue]
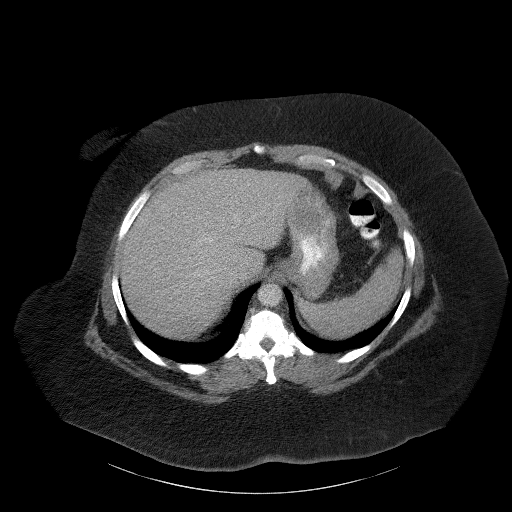
[im 85/102  lung]
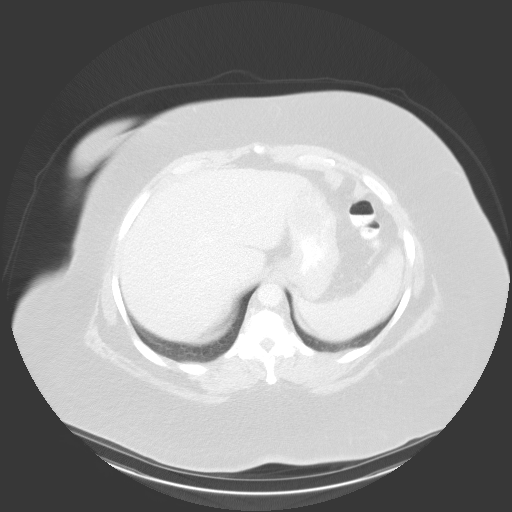
[im 93/102  soft-tissue]
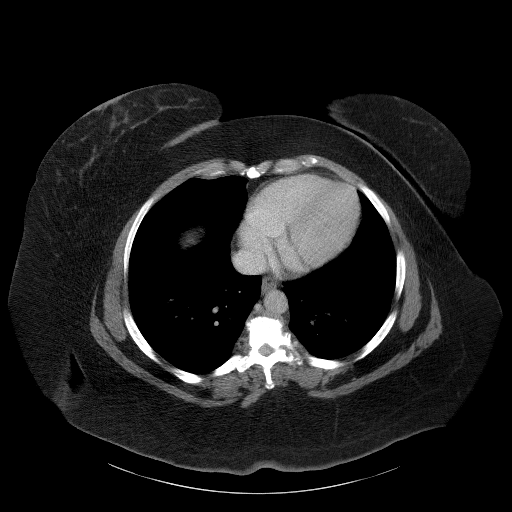
[im 93/102  lung]
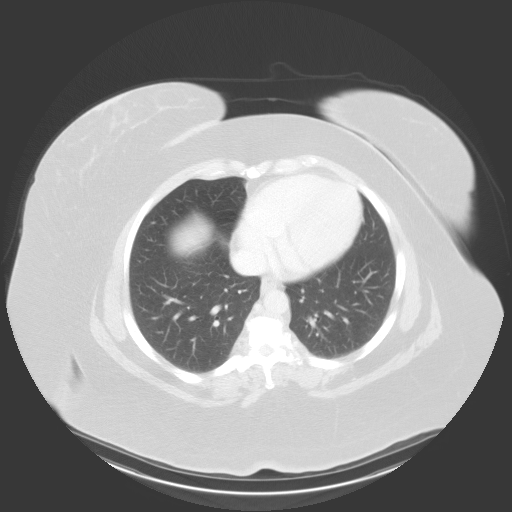

[11 of 32 positions shown; findings below may reference images not displayed]

FINDINGS: Lower chest: No acute abnormality.

Hepatobiliary: No focal liver abnormality is seen. Status post
cholecystectomy. No biliary dilatation.

Pancreas: Unremarkable. No pancreatic ductal dilatation or
surrounding inflammatory changes.

Spleen: Normal in size without focal abnormality.

Adrenals/Urinary Tract: Adrenal glands are unremarkable. Kidneys are
normal, without renal calculi, focal lesion, or hydronephrosis.
Bladder is unremarkable.

Stomach/Bowel: Stomach is within normal limits. Appendix appears
normal. No evidence of bowel wall thickening, distention, or
inflammatory changes.

Vascular/Lymphatic: No significant vascular findings are present. No
enlarged abdominal or pelvic lymph nodes.

Reproductive: Uterus and bilateral adnexa are unremarkable. T-type
IUD in satisfactory position.

Other: Small fat containing umbilical hernia. No abdominopelvic
ascites.

Musculoskeletal: No acute osseous abnormality. No aggressive osseous
lesion. Minimal grade 1 anterolisthesis of L4 on L5 secondary to
severe bilateral facet arthropathy. Severe bilateral facet
arthropathy at L5-S1. Degenerative disc disease disc height loss at
L5-S1. mild osteoarthritis of bilateral sacroiliac joints.
IMPRESSION: 1. No acute abdominal or pelvic pathology.
2. Lower lumbar spine spondylosis.

## 2017-07-05 ENCOUNTER — Ambulatory Visit: Payer: BLUE CROSS/BLUE SHIELD | Admitting: Adult Health

## 2017-07-05 ENCOUNTER — Encounter: Payer: Self-pay | Admitting: Adult Health

## 2017-07-05 VITALS — BP 134/86 | HR 86 | Temp 98.4°F | Resp 16 | Wt 337.0 lb

## 2017-07-05 DIAGNOSIS — M5432 Sciatica, left side: Secondary | ICD-10-CM | POA: Diagnosis not present

## 2017-07-05 MED ORDER — PREDNISONE 10 MG PO TABS: ORAL_TABLET | ORAL | 0 refills | Status: DC

## 2017-07-05 NOTE — Progress Notes (Signed)
Subjective:    Patient ID: Charlotte Meyer, female    DOB: 12-27-1965, 52 y.o.   MRN: [id number]  HPI  52 year old female who  has a past medical history of Allergic rhinitis, Anemia, Depression, GERD (gastroesophageal reflux disease), History of UTI, Hyperlipemia, Lactose intolerance, OA (osteoarthritis) of knee, and Obesity.  She presents to Saturday clinic today for the complaint of left lower back pain and pain that radiates down the outside of her left leg. Pain is described as "sharp and stabbing"   She has a history of sciatica and feels as though this is consistent with pain she has experienced with other episodes of sciatica. She does endorse feeling as though her legs are going to give out on her. Denies any falls to trauma   She was seen last week by Dr. Ethelene Hal and received a toradol injection, she reports that this worked for about 1 day   She has been using Aleve. Tylenol and Flexeril which has not helped with her discomfort   Denies any issues with bowel or bladder  Review of Systems See HPI   Past Medical History:  Diagnosis Date  . Allergic rhinitis   . Anemia   . Depression    treated with zoloft in past  . GERD (gastroesophageal reflux disease)   . History of UTI   . Hyperlipemia    no treatment required  . Lactose intolerance   . OA (osteoarthritis) of knee    bilaterally   . Obesity     Social History   Socioeconomic History  . Marital status: Single    Spouse name: Not on file  . Number of children: 1  . Years of education: Not on file  . Highest education level: Not on file  Social Needs  . Financial resource strain: Not on file  . Food insecurity - worry: Not on file  . Food insecurity - inability: Not on file  . Transportation needs - medical: Not on file  . Transportation needs - non-medical: Not on file  Occupational History    Employer: [employer]  Tobacco Use  . Smoking status: Former Research scientist (life sciences)  . Smokeless tobacco:  Never Used  . Tobacco comment: quit them "long time ago"  Substance and Sexual Activity  . Alcohol use: No  . Drug use: No  . Sexual activity: Not on file  Other Topics Concern  . Not on file  Social History Narrative   Daily caffeine use    Past Surgical History:  Procedure Laterality Date  . CHOLECYSTECTOMY N/A 09/12/2015   Procedure: LAPAROSCOPIC CHOLECYSTECTOMY WITH INTRAOPERATIVE CHOLANGIOGRAM;  Surgeon: Jackolyn Confer, MD;  Location: [facility];  Service: General;  Laterality: N/A;  . COLONOSCOPY WITH PROPOFOL N/A 03/08/2016   Procedure: COLONOSCOPY WITH PROPOFOL;  Surgeon: Otis Brace, MD;  Location: [place];  Service: Gastroenterology;  Laterality: N/A;  . FOOT SURGERY      Family History  Problem Relation Age of Onset  . Cancer Father        throat  . Diabetes Other   . Cholelithiasis Other     Allergies  Allergen Reactions  . Penicillins     Yeast infections Has patient had a PCN reaction causing immediate rash, facial/tongue/throat swelling, SOB or lightheadedness with hypotension: No Has patient had a PCN reaction causing severe rash involving mucus membranes or skin necrosis: No Has patient had a PCN reaction that required hospitalization No Has patient had a PCN reaction occurring within the  last 10 years: No If all of the above answers are "NO", then may proceed with Cephalosporin use.   . Tramadol Other (See Comments)    Loopy and confused    Current Outpatient Medications on File Prior to Visit  Medication Sig Dispense Refill  . celecoxib (CELEBREX) 200 MG capsule Take 1 capsule (200 mg total) by mouth daily. 30 capsule 0  . cyclobenzaprine (FLEXERIL) 5 MG tablet Take 1 tablet (5 mg total) by mouth 3 (three) times daily as needed for muscle spasms. 30 tablet 1  . fexofenadine-pseudoephedrine (ALLEGRA-D 24) 180-240 MG per 24 hr tablet Take 1 tablet by mouth daily as needed. 30 tablet 1  . fluconazole (DIFLUCAN) 150 MG tablet 1 by mouth every 3 days  as needed 2 tablet 1  . folic acid (FOLVITE) 1 MG tablet Take 1 mg by mouth daily.  5  . gabapentin (NEURONTIN) 300 MG capsule Take 300 mg by mouth at bedtime. Takes as needed  1  . ibuprofen (ADVIL,MOTRIN) 200 MG tablet Take 400 mg by mouth every 6 (six) hours as needed for headache or moderate pain.    Marland Kitchen ipratropium (ATROVENT) 0.06 % nasal spray Place 2 sprays into both nostrils 4 (four) times daily. 15 mL 1  . pantoprazole (PROTONIX) 40 MG tablet Take 1 tablet (40 mg total) by mouth daily. 90 tablet 0  . predniSONE (DELTASONE) 10 MG tablet 3 tabs by mouth per day for 3 days,2tabs per day for 3 days,1tab per day for 3 days 18 tablet 0  . triamcinolone cream (KENALOG) 0.5 % Apply 1 application topically 3 (three) times daily as needed (for rash). 60 g 1  . Vitamin D, Ergocalciferol, (DRISDOL) 50000 UNITS CAPS capsule TAKE ONE CAPSULE BY MOUTH ONCE A WEEK 10 capsule 0   Current Facility-Administered Medications on File Prior to Visit  Medication Dose Route Frequency Provider Last Rate Last Dose  . betamethasone acetate-betamethasone sodium phosphate (CELESTONE) injection 3 mg  3 mg Intramuscular Once Daylene Katayama M, DPM      . betamethasone acetate-betamethasone sodium phosphate (CELESTONE) injection 3 mg  3 mg Intramuscular Once Evans, Brent M, DPM        BP 134/86   Pulse 86   Temp 98.4 F (36.9 C) (Oral)   Resp 16   Wt (!) 337 lb (152.9 kg)   SpO2 96%   BMI 54.39 kg/m       Objective:   Physical Exam  Constitutional: She is oriented to person, place, and time. She appears well-developed and well-nourished. No distress.  Cardiovascular: Normal rate, regular rhythm, normal heart sounds and intact distal pulses. Exam reveals no gallop and no friction rub.  No murmur heard. Pulmonary/Chest: Effort normal and breath sounds normal. No respiratory distress. She has no wheezes. She has no rales. She exhibits no tenderness.  Musculoskeletal: She exhibits tenderness.        Legs: Neurological: She is alert and oriented to person, place, and time.  Skin: Skin is warm and dry. No rash noted. She is not diaphoretic. No erythema. No pallor.  Psychiatric: She has a normal mood and affect. Her behavior is normal. Judgment and thought content normal.  Nursing note and vitals reviewed.     Assessment & Plan:  1. Sciatica of left side - predniSONE (DELTASONE) 10 MG tablet; 40 mg x 3 days, 20 mg x 3 days, 10 mg x 3 days  Dispense: 21 tablet; Refill: 0 - Continue with Flexeril and NSAIDS - Can use  heating pad  - Follow up with PCP if no improvement   Dorothyann Peng, NP

## 2017-07-07 ENCOUNTER — Ambulatory Visit: Payer: Self-pay | Admitting: *Deleted

## 2017-07-07 NOTE — Telephone Encounter (Signed)
Please schedule an office with a visit with Dr. Raeford Razor this week.  Thank you

## 2017-07-07 NOTE — Telephone Encounter (Signed)
Please advise 

## 2017-07-07 NOTE — Telephone Encounter (Signed)
Pt. States prednisone and Flexeril are not helping. Stll having low back and leg pain. Seen at [facility] on Saturday. Requests something else be called in.

## 2017-07-08 ENCOUNTER — Encounter: Payer: Self-pay | Admitting: Family Medicine

## 2017-07-08 ENCOUNTER — Ambulatory Visit: Payer: BLUE CROSS/BLUE SHIELD | Admitting: Family Medicine

## 2017-07-08 VITALS — BP 138/76 | HR 98 | Temp 98.6°F | Ht 66.0 in | Wt 339.0 lb

## 2017-07-08 DIAGNOSIS — M545 Low back pain, unspecified: Secondary | ICD-10-CM

## 2017-07-08 DIAGNOSIS — Z6841 Body Mass Index (BMI) 40.0 and over, adult: Secondary | ICD-10-CM | POA: Diagnosis not present

## 2017-07-08 MED ORDER — MELOXICAM 15 MG PO TABS: 15.0000 mg | ORAL_TABLET | Freq: Every day | ORAL | 0 refills | Status: DC

## 2017-07-08 NOTE — Assessment & Plan Note (Signed)
Discussed with her today - counseled on different weight loss options.

## 2017-07-08 NOTE — Patient Instructions (Addendum)
Please try the exercises  Please try Aspercreme with lidocaine  Please try physical therapy to see if you get benefit from it.  Please follow up with me in 4-6 weeks if your symptoms do not improve.  Please take the mobic for 14 days straight and then as needed.

## 2017-07-08 NOTE — Assessment & Plan Note (Signed)
Has findings on CT to explain the low back pain. Historically has similar symptoms and findings on MRI in 2003 that show reason for some form of sciatica. 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  - mobic  - continue muscle relaxer  - counseled on HEP  - referral to PT  - if no improvement consider MRI and could try facet injections vs epidural  - could consider referral to surgery for bariatric

## 2017-07-08 NOTE — Progress Notes (Signed)
Charlotte Meyer - 52 y.o. female MRN [id number]  Date of birth: [birth year]  SUBJECTIVE:  Including CC & ROS.  Chief Complaint  Patient presents with  . Back Pain    Charlotte Meyer is a 52 y.o. female that is presenting with low back pain with radicular symptoms down her left leg. Pain has been increasing over the past two weeks. Pain is an intermittent sharp pain. Sitting, standing or walking do not trigger the pain. Patient was seen 07/05/17 prescribed Flexeril and Prednisone with some improvement. Denies injury or surgeries. Denies tingling or numbness. She describes having these types of flares on different occasions. Has not tried PT.   Review of her Ct abdomen pelvis from 06/27/17 shows Minimal grade 1 anterolisthesis of L4 on L5 secondary to severe bilateral facet arthropathy. Severe bilateral facet arthropathy at L5-S1. Degenerative disc disease disc height loss at L5-S1. mild osteoarthritis of bilateral sacroiliac joints.  Review of the MRI lumbar spine from 2003  Shows mild annula bulging at L3-L4 and L4-5. Mild lower lumbar facet arthropathy without lateral recess encroachment or foraminal narrowing.    Review of Systems  Constitutional: Negative for fever.  HENT: Negative for sinus pain.   Respiratory: Negative for cough.   Cardiovascular: Negative for chest pain.  Gastrointestinal: Negative for abdominal pain.  Musculoskeletal: Positive for back pain.  Skin: Negative for color change.  Neurological: Negative for weakness.  Hematological: Negative for adenopathy.  Psychiatric/Behavioral: Negative for agitation.    HISTORY: Past Medical, Surgical, Social, and Family History Reviewed & Updated per EMR.   Pertinent Historical Findings include:  Past Medical History:  Diagnosis Date  . Allergic rhinitis   . Anemia   . Depression    treated with zoloft in past  . GERD (gastroesophageal reflux disease)   . History of UTI   . Hyperlipemia    no treatment required  .  Lactose intolerance   . OA (osteoarthritis) of knee    bilaterally   . Obesity     Past Surgical History:  Procedure Laterality Date  . CHOLECYSTECTOMY N/A 09/12/2015   Procedure: LAPAROSCOPIC CHOLECYSTECTOMY WITH INTRAOPERATIVE CHOLANGIOGRAM;  Surgeon: Jackolyn Confer, MD;  Location: [facility];  Service: General;  Laterality: N/A;  . COLONOSCOPY WITH PROPOFOL N/A 03/08/2016   Procedure: COLONOSCOPY WITH PROPOFOL;  Surgeon: Otis Brace, MD;  Location: [place];  Service: Gastroenterology;  Laterality: N/A;  . FOOT SURGERY      Allergies  Allergen Reactions  . Penicillins     Yeast infections Has patient had a PCN reaction causing immediate rash, facial/tongue/throat swelling, SOB or lightheadedness with hypotension: No Has patient had a PCN reaction causing severe rash involving mucus membranes or skin necrosis: No Has patient had a PCN reaction that required hospitalization No Has patient had a PCN reaction occurring within the last 10 years: No If all of the above answers are "NO", then may proceed with Cephalosporin use.   . Tramadol Other (See Comments)    Loopy and confused    Family History  Problem Relation Age of Onset  . Cancer Father        throat  . Diabetes Other   . Cholelithiasis Other      Social History   Socioeconomic History  . Marital status: Single    Spouse name: Not on file  . Number of children: 1  . Years of education: Not on file  . Highest education level: Not on file  Social Needs  . Emergency planning/management officer  strain: Not on file  . Food insecurity - worry: Not on file  . Food insecurity - inability: Not on file  . Transportation needs - medical: Not on file  . Transportation needs - non-medical: Not on file  Occupational History    Employer: [employer]  Tobacco Use  . Smoking status: Former Research scientist (life sciences)  . Smokeless tobacco: Never Used  . Tobacco comment: quit them "long time ago"  Substance and Sexual Activity  . Alcohol use:  No  . Drug use: No  . Sexual activity: Not on file  Other Topics Concern  . Not on file  Social History Narrative   Daily caffeine use     PHYSICAL EXAM:  VS: BP 138/76 (BP Location: Left Arm, Patient Position: Sitting, Cuff Size: Large)   Pulse 98   Temp 98.6 F (37 C) (Oral)   Ht 5\' 6"  (1.676 m)   Wt (!) 339 lb (153.8 kg)   SpO2 98%   BMI 54.72 kg/m  Physical Exam Gen: NAD, alert, cooperative with exam, obese,  ENT: normal lips, normal nasal mucosa,  Eye: normal EOM, normal conjunctiva and lids CV:  no edema, +2 pedal pulses   Resp: no accessory muscle use, non-labored,  Skin: no rashes, no areas of induration  Neuro: normal tone, normal sensation to touch Psych:  normal insight, alert and oriented MSK:  Back:  TTP of the paraspinal muscles  No TTP of the midline lumbar spine, GT  Normal IR and ER  Normal strength to resistance  Negative SLR b/l  Normal gait  Neurovascularly intact      ASSESSMENT & PLAN:   Acute low back pain without sciatica Has findings on CT to explain the low back pain. Historically has similar symptoms and findings on MRI in 2003 that show reason for some form of sciatica. 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  - mobic  - continue muscle relaxer  - counseled on HEP  - referral to PT  - if no improvement consider MRI and could try facet injections vs epidural  - could consider referral to surgery for bariatric   Obesity Discussed with her today - counseled on different weight loss options.

## 2017-07-08 NOTE — Telephone Encounter (Signed)
pt notified, appt scheduled

## 2017-07-10 DIAGNOSIS — Z6841 Body Mass Index (BMI) 40.0 and over, adult: Secondary | ICD-10-CM | POA: Diagnosis not present

## 2017-07-10 DIAGNOSIS — Z01419 Encounter for gynecological examination (general) (routine) without abnormal findings: Secondary | ICD-10-CM | POA: Diagnosis not present

## 2017-07-10 DIAGNOSIS — Z124 Encounter for screening for malignant neoplasm of cervix: Secondary | ICD-10-CM | POA: Diagnosis not present

## 2017-07-18 DIAGNOSIS — E785 Hyperlipidemia, unspecified: Secondary | ICD-10-CM | POA: Diagnosis not present

## 2017-07-18 DIAGNOSIS — F439 Reaction to severe stress, unspecified: Secondary | ICD-10-CM | POA: Diagnosis not present

## 2017-07-18 DIAGNOSIS — I10 Essential (primary) hypertension: Secondary | ICD-10-CM | POA: Diagnosis not present

## 2017-07-18 DIAGNOSIS — Z5181 Encounter for therapeutic drug level monitoring: Secondary | ICD-10-CM | POA: Diagnosis not present

## 2017-08-29 DIAGNOSIS — M549 Dorsalgia, unspecified: Secondary | ICD-10-CM | POA: Diagnosis not present

## 2017-08-29 DIAGNOSIS — I10 Essential (primary) hypertension: Secondary | ICD-10-CM | POA: Diagnosis not present

## 2017-08-29 DIAGNOSIS — J069 Acute upper respiratory infection, unspecified: Secondary | ICD-10-CM | POA: Diagnosis not present

## 2017-08-29 DIAGNOSIS — R609 Edema, unspecified: Secondary | ICD-10-CM | POA: Diagnosis not present

## 2017-09-01 ENCOUNTER — Encounter: Payer: Self-pay | Admitting: Family Medicine

## 2017-09-11 ENCOUNTER — Other Ambulatory Visit: Payer: Self-pay | Admitting: Physician Assistant

## 2017-09-11 DIAGNOSIS — Z139 Encounter for screening, unspecified: Secondary | ICD-10-CM

## 2017-09-12 ENCOUNTER — Ambulatory Visit
Admission: RE | Admit: 2017-09-12 | Discharge: 2017-09-12 | Disposition: A | Discharge status: Home / Self Care | Payer: BLUE CROSS/BLUE SHIELD | Source: Ambulatory Visit | Attending: Physician Assistant | Admitting: Physician Assistant

## 2017-09-12 DIAGNOSIS — Z1231 Encounter for screening mammogram for malignant neoplasm of breast: Secondary | ICD-10-CM | POA: Diagnosis not present

## 2017-09-12 DIAGNOSIS — Z139 Encounter for screening, unspecified: Secondary | ICD-10-CM

## 2017-09-12 IMAGING — MG DIGITAL SCREENING BILATERAL MAMMOGRAM WITH TOMO AND CAD
6 of 12 series · 6 of 36 positions shown · non-contrast
Comparison: Previous exam(s).

CLINICAL DATA: Screening.

EXAM:
DIGITAL SCREENING BILATERAL MAMMOGRAM WITH TOMO AND CAD

[L MLO synth-2D]
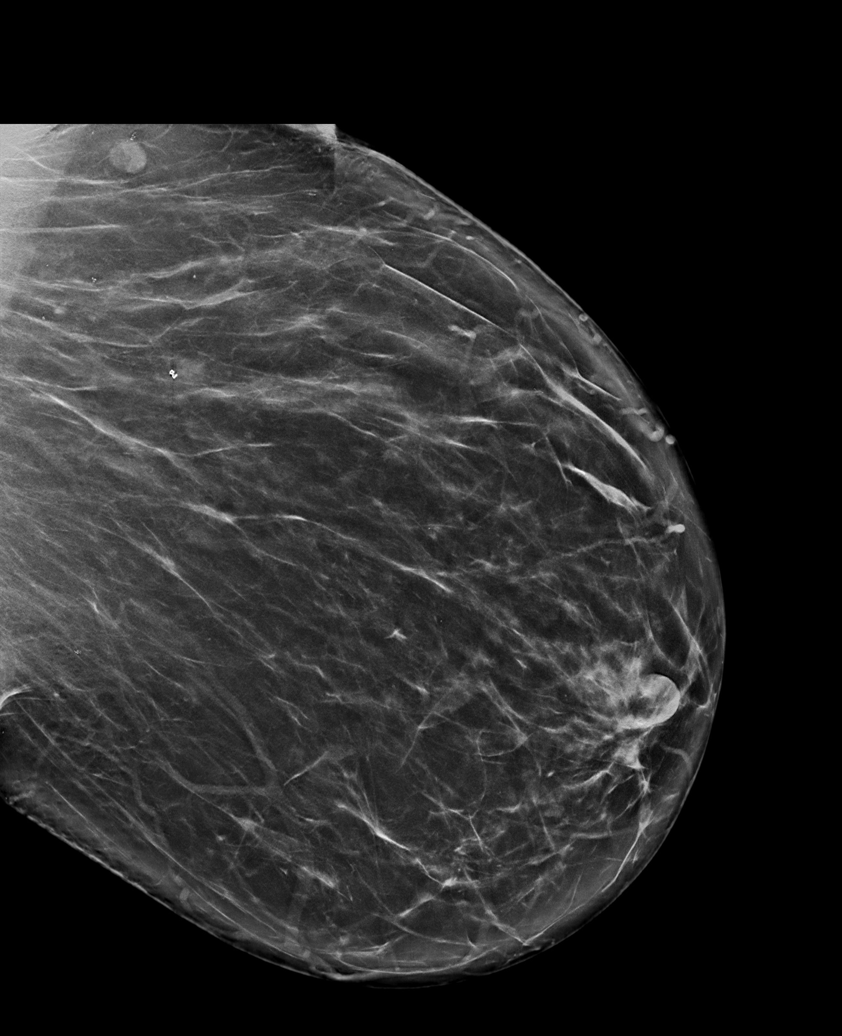

[L CC synth-2D (1 of 2)]
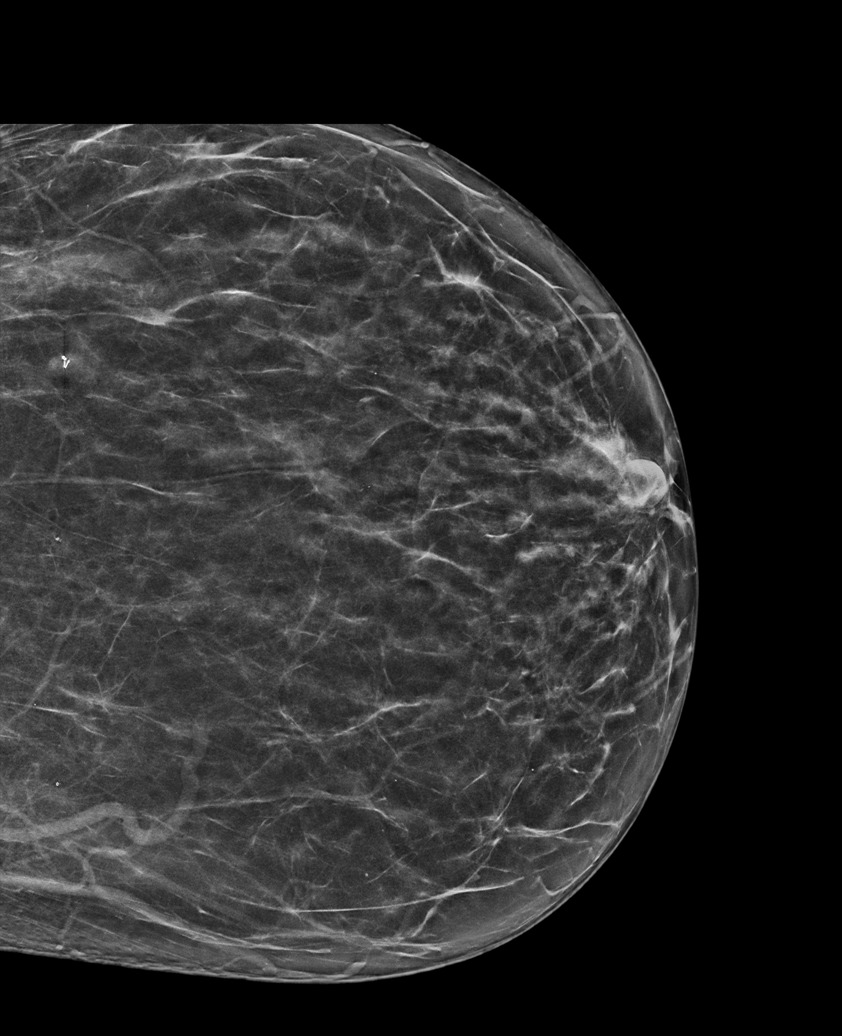

[R CC synth-2D (1 of 2)]
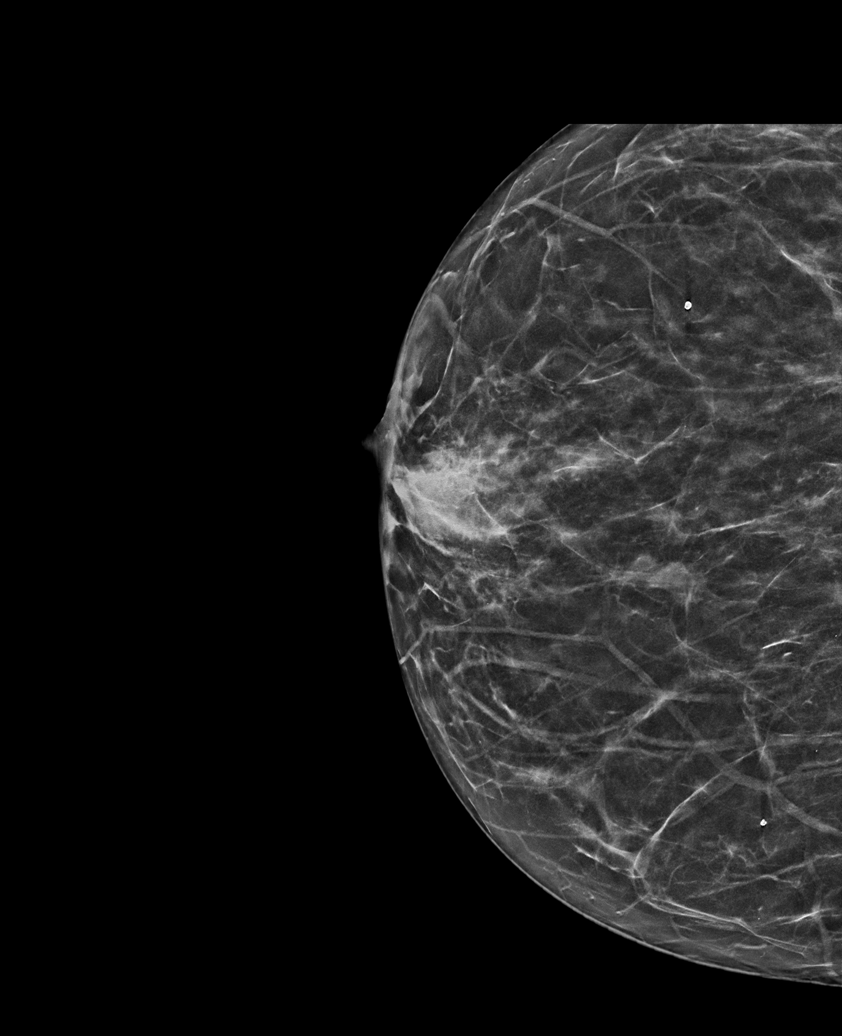

[L CC synth-2D (2 of 2)]
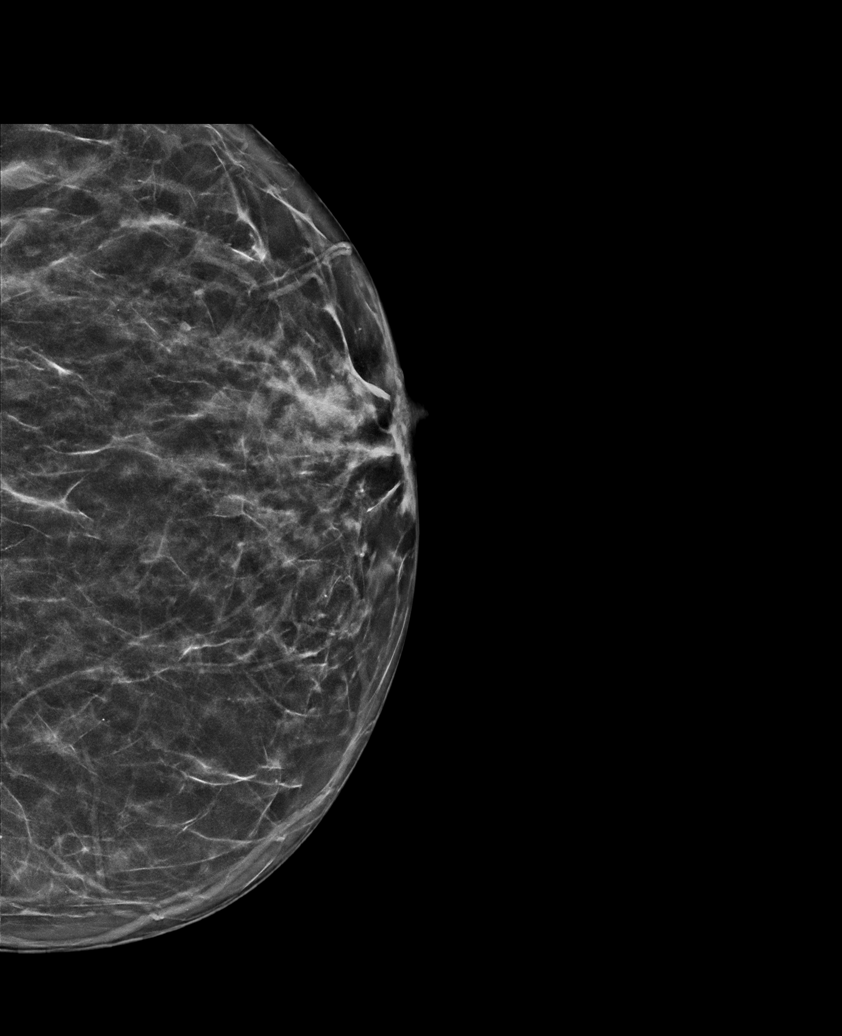

[R CC synth-2D (2 of 2)]
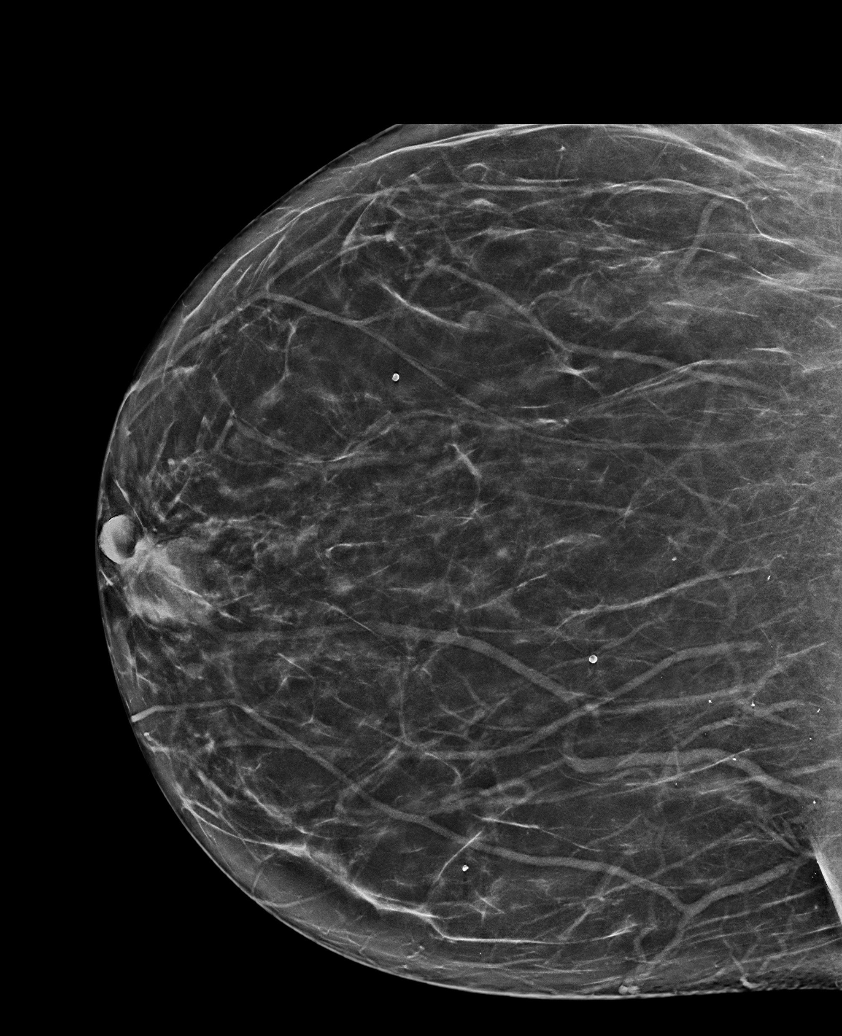

[R MLO synth-2D]
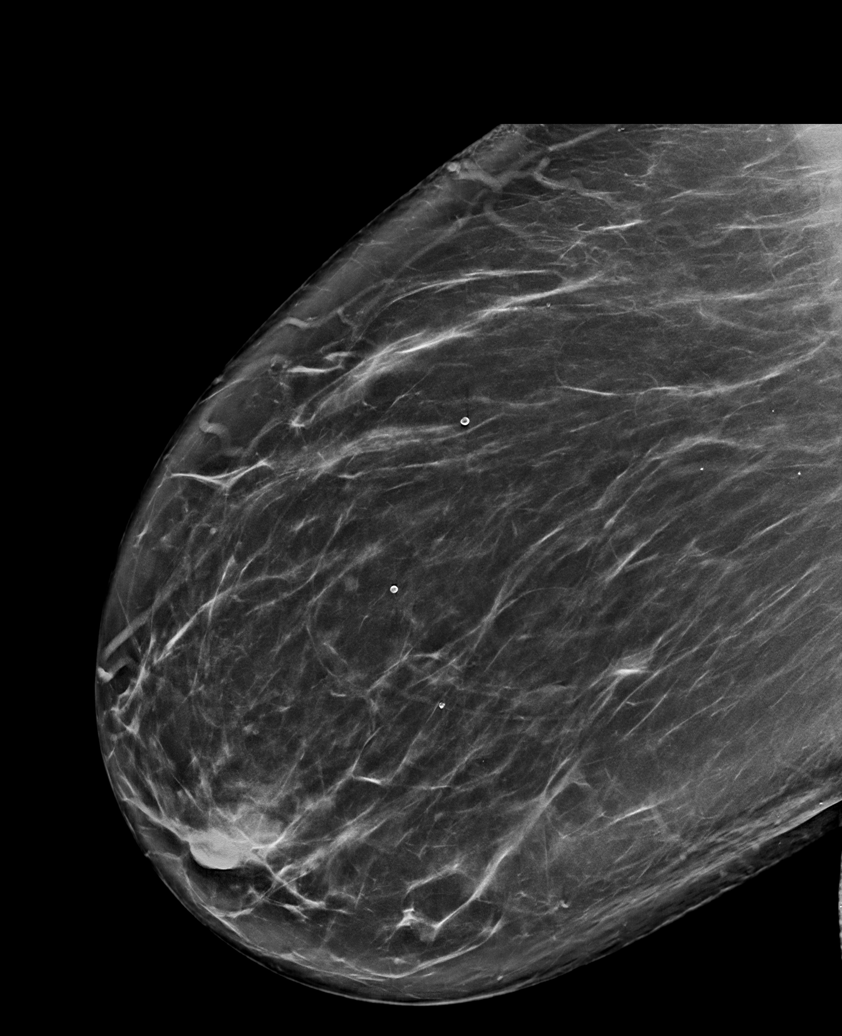

[6 of 36 positions shown; findings below may reference images not displayed]

ACR Breast Density Category b: There are scattered areas of
fibroglandular density.
FINDINGS: There are no findings suspicious for malignancy. Images were
processed with CAD.
IMPRESSION: No mammographic evidence of malignancy. A result letter of this
screening mammogram will be mailed directly to the patient.

RECOMMENDATION:
Screening mammogram in one year. (Code:[TQ])

BI-RADS CATEGORY  1: Negative.

## 2017-09-19 DIAGNOSIS — F439 Reaction to severe stress, unspecified: Secondary | ICD-10-CM | POA: Diagnosis not present

## 2017-09-19 DIAGNOSIS — R609 Edema, unspecified: Secondary | ICD-10-CM | POA: Diagnosis not present

## 2017-09-19 DIAGNOSIS — I10 Essential (primary) hypertension: Secondary | ICD-10-CM | POA: Diagnosis not present

## 2017-10-17 DIAGNOSIS — Q149 Congenital malformation of posterior segment of eye, unspecified: Secondary | ICD-10-CM | POA: Diagnosis not present

## 2017-10-17 DIAGNOSIS — H18413 Arcus senilis, bilateral: Secondary | ICD-10-CM | POA: Diagnosis not present

## 2017-10-17 DIAGNOSIS — H40033 Anatomical narrow angle, bilateral: Secondary | ICD-10-CM | POA: Diagnosis not present

## 2017-10-17 DIAGNOSIS — H40023 Open angle with borderline findings, high risk, bilateral: Secondary | ICD-10-CM | POA: Diagnosis not present

## 2017-10-31 DIAGNOSIS — I10 Essential (primary) hypertension: Secondary | ICD-10-CM | POA: Diagnosis not present

## 2017-10-31 DIAGNOSIS — F439 Reaction to severe stress, unspecified: Secondary | ICD-10-CM | POA: Diagnosis not present

## 2017-10-31 DIAGNOSIS — N926 Irregular menstruation, unspecified: Secondary | ICD-10-CM | POA: Diagnosis not present

## 2017-10-31 DIAGNOSIS — E785 Hyperlipidemia, unspecified: Secondary | ICD-10-CM | POA: Diagnosis not present

## 2017-10-31 DIAGNOSIS — R609 Edema, unspecified: Secondary | ICD-10-CM | POA: Diagnosis not present

## 2017-10-31 DIAGNOSIS — M25561 Pain in right knee: Secondary | ICD-10-CM | POA: Diagnosis not present

## 2017-12-19 DIAGNOSIS — R609 Edema, unspecified: Secondary | ICD-10-CM | POA: Diagnosis not present

## 2017-12-19 DIAGNOSIS — I10 Essential (primary) hypertension: Secondary | ICD-10-CM | POA: Diagnosis not present

## 2017-12-19 DIAGNOSIS — F439 Reaction to severe stress, unspecified: Secondary | ICD-10-CM | POA: Diagnosis not present

## 2017-12-21 ENCOUNTER — Ambulatory Visit (HOSPITAL_COMMUNITY)
Admission: EM | Admit: 2017-12-21 | Discharge: 2017-12-21 | Disposition: A | Discharge status: Home / Self Care | Payer: BLUE CROSS/BLUE SHIELD | Attending: Internal Medicine | Admitting: Internal Medicine

## 2017-12-21 ENCOUNTER — Other Ambulatory Visit: Payer: Self-pay

## 2017-12-21 ENCOUNTER — Ambulatory Visit (INDEPENDENT_AMBULATORY_CARE_PROVIDER_SITE_OTHER): Payer: BLUE CROSS/BLUE SHIELD

## 2017-12-21 ENCOUNTER — Encounter (HOSPITAL_COMMUNITY): Payer: Self-pay | Admitting: Emergency Medicine

## 2017-12-21 DIAGNOSIS — M79642 Pain in left hand: Secondary | ICD-10-CM | POA: Diagnosis not present

## 2017-12-21 IMAGING — DX DG HAND COMPLETE 3+V*L*
3 series · 3 of 3 positions shown · non-contrast
Comparison: No priors.

CLINICAL DATA: 52-year-old female with injury to the left hand
complaining of left hand pain.

EXAM:
LEFT HAND - COMPLETE 3+ VIEW

[hand pa]
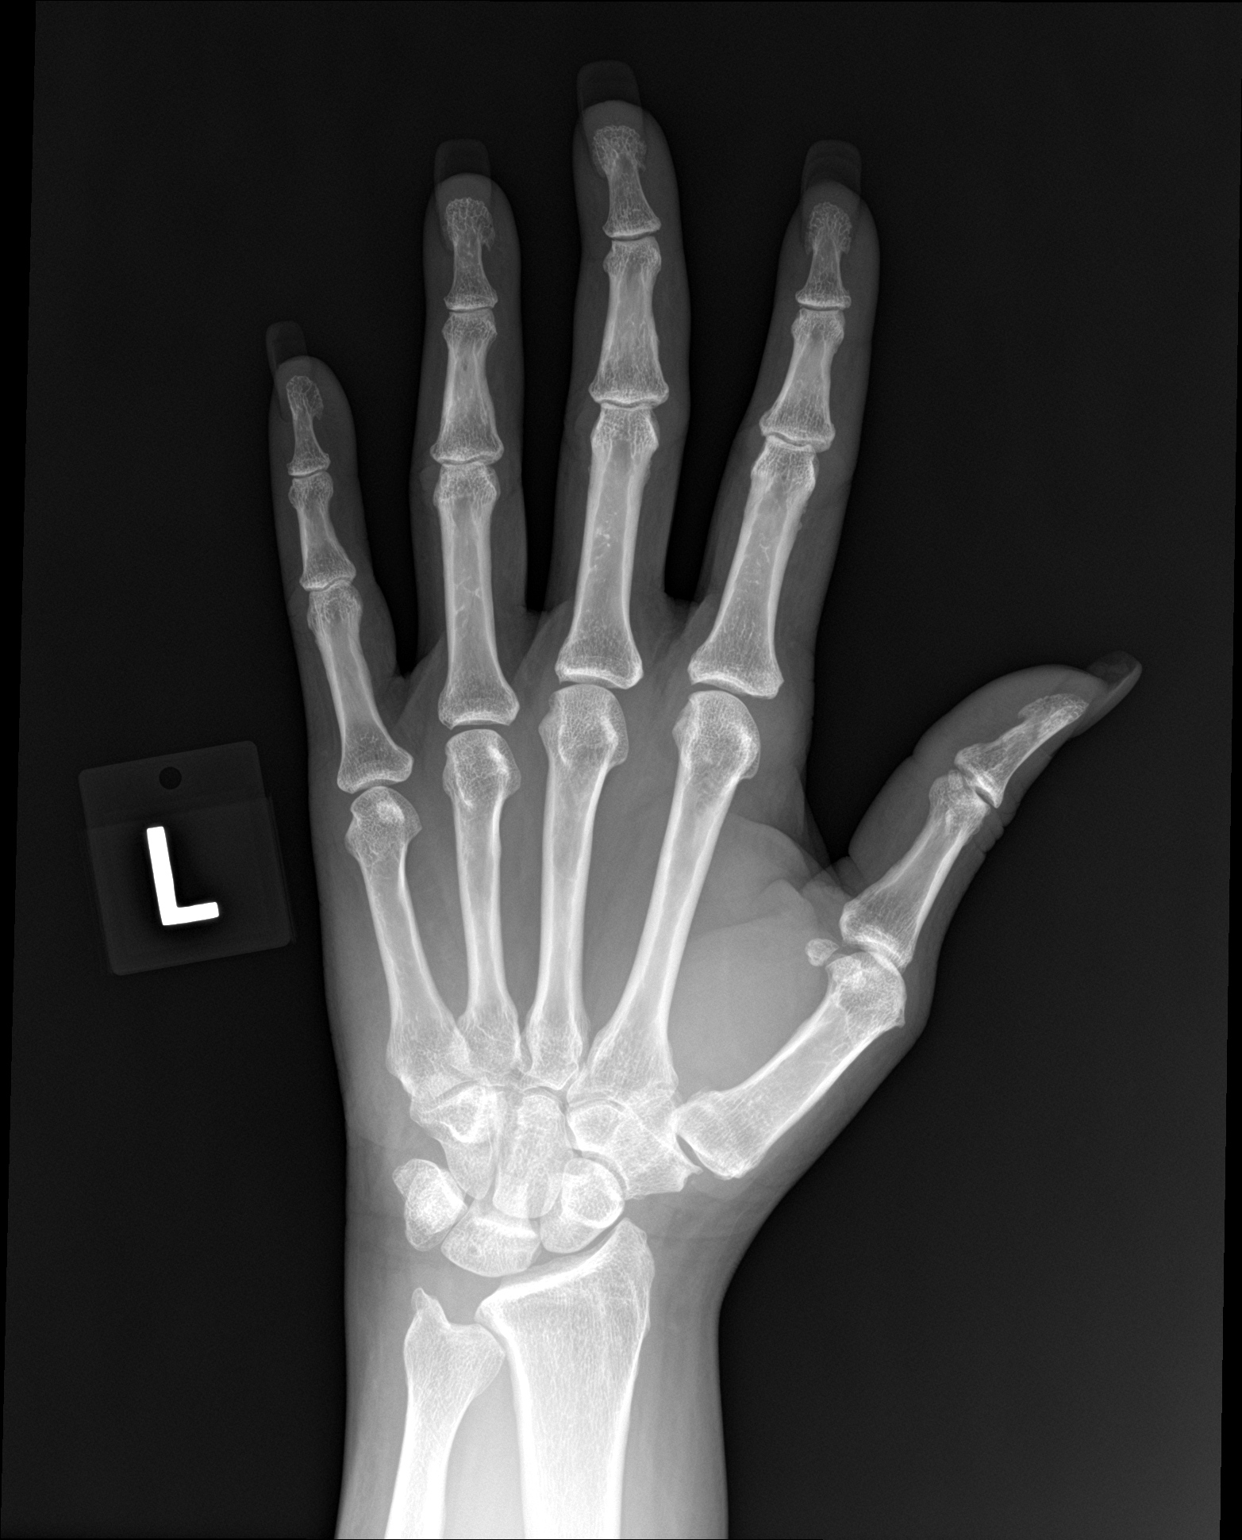

[hand obl]
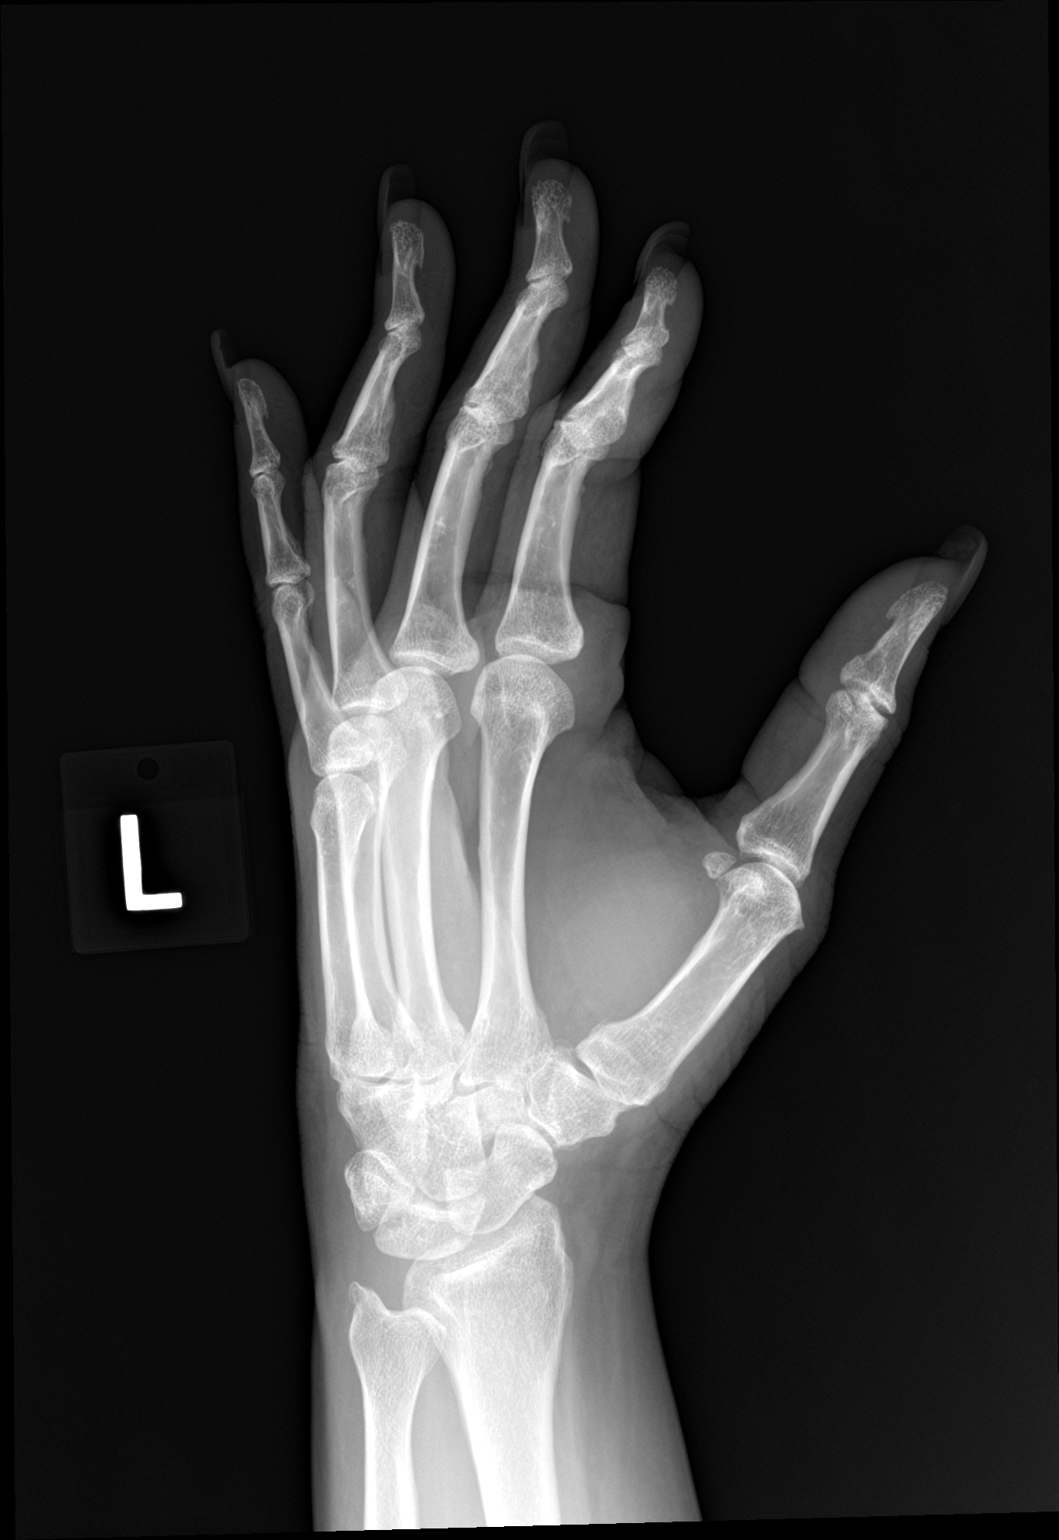

[hand lat]
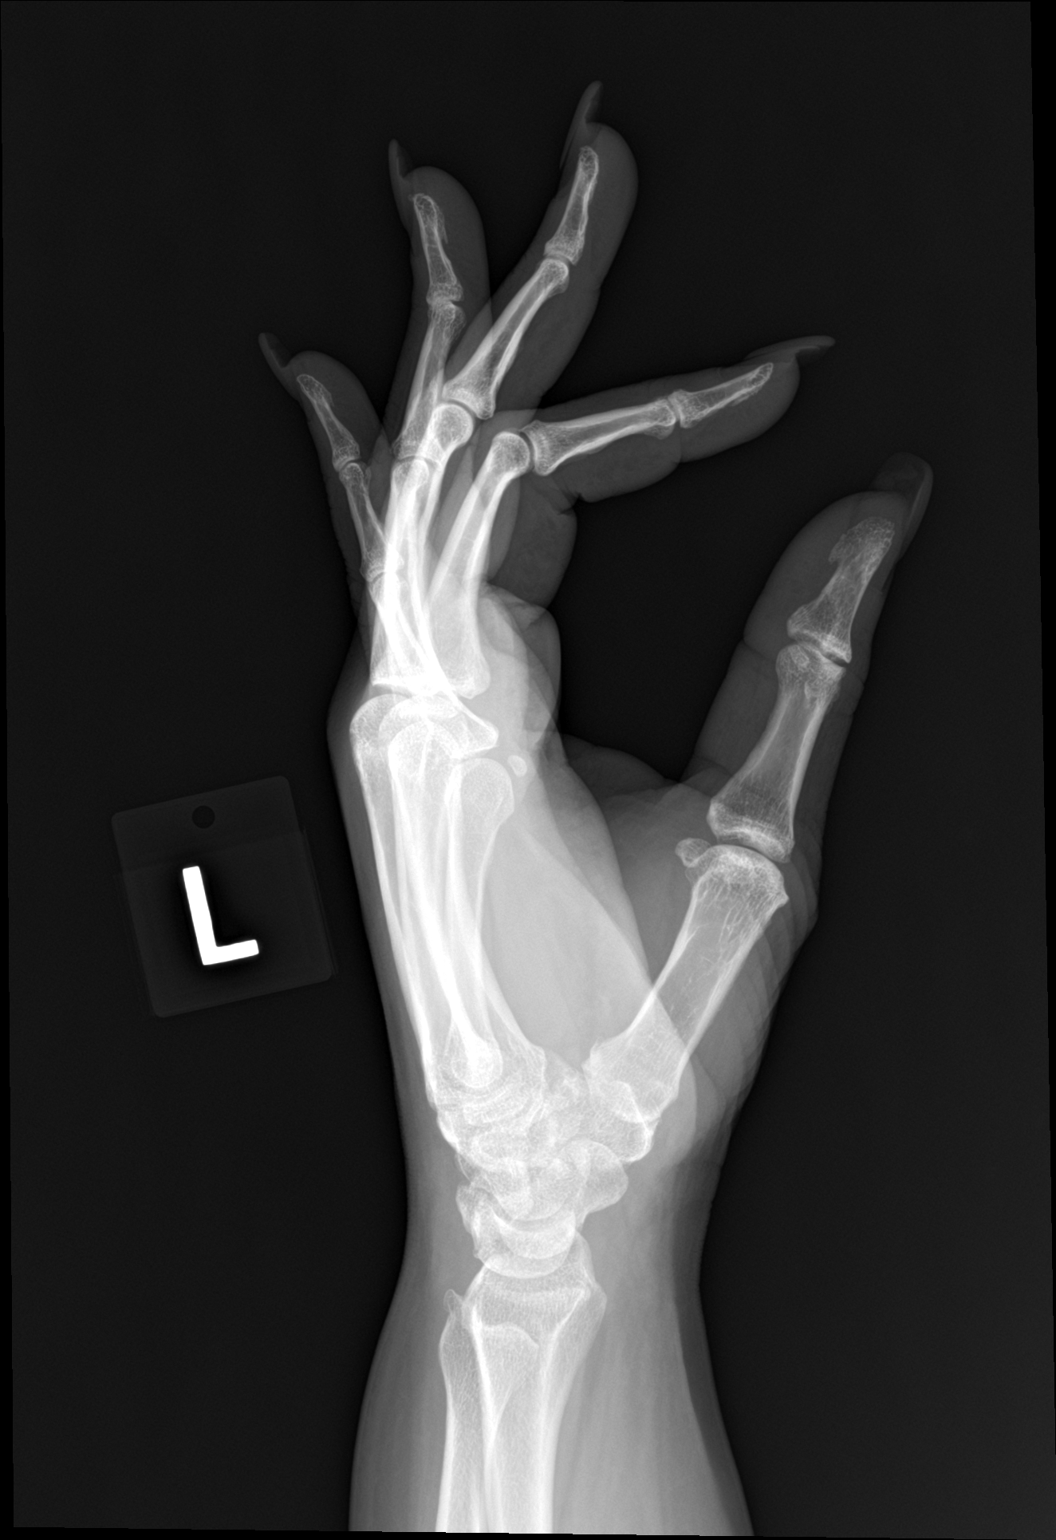

[3 of 3 positions shown; findings below may reference images not displayed]

FINDINGS: There is no evidence of fracture or dislocation. There is no
evidence of arthropathy or other focal bone abnormality. Soft
tissues are unremarkable.
IMPRESSION: Negative.

## 2017-12-21 MED ORDER — IBUPROFEN 800 MG PO TABS: 800.0000 mg | ORAL_TABLET | Freq: Three times a day (TID) | ORAL | 0 refills | Status: DC

## 2017-12-21 NOTE — ED Triage Notes (Signed)
Patient reports playing with a family member , had left hand positioned on a bed, but family member rolled over on her hand and had significant pain on left hand at thumb.  Continues to have pain in thumb today.  States unable to make a fist due to thumb pain

## 2017-12-21 NOTE — ED Provider Notes (Signed)
[facility]    CSN: [id number] Arrival date & time: 12/21/17  1042     History   Chief Complaint Chief Complaint  Patient presents with  . Hand Injury    HPI Charlotte Meyer is a 52 y.o. female no contributing past medical history presenting today for evaluation of left hand injury.  Patient states that last night she was holding her hand in a flexed position, her daughter rolled onto her hand.  Since she has had significant pain with bending or using her left thumb, difficulty squeezing things.  Pain mainly on the palmar aspect.  She has not taken any medicines for her symptoms.  Denies numbness or tingling.  HPI  Past Medical History:  Diagnosis Date  . Allergic rhinitis   . Anemia   . Depression    treated with zoloft in past  . GERD (gastroesophageal reflux disease)   . History of UTI   . Hyperlipemia    no treatment required  . Lactose intolerance   . OA (osteoarthritis) of knee    bilaterally   . Obesity     Patient Active Problem List   Diagnosis Date Noted  . Acute upper back pain 06/13/2017  . Vaginitis 01/31/2017  . Acute low back pain without sciatica 01/31/2017  . Posterior neck pain 01/31/2017  . Right knee pain 01/31/2017  . Cramps, extremity 12/06/2016  . Piriformis syndrome 12/06/2016  . Cellulitis, umbilical 40/98/1191  . Heat intolerance 10/31/2016  . Allergic rhinitis 10/31/2016  . Chronic pain of both knees 10/31/2016  . Symptomatic cholelithiasis 09/12/2015  . Cystitis 09/29/2014  . Vitamin D deficiency 09/29/2014  . Pain in joint, shoulder region 08/13/2013  . Chronic venous insufficiency 11/06/2012  . Stasis dermatitis of both legs 11/06/2012  . Lumbago 11/06/2012  . Vitamin B 12 deficiency 08/05/2012  . Fatigue 08/05/2012  . Well adult exam 01/30/2011  . Anemia 01/30/2011  . ABDOMINAL PAIN-RUQ 12/14/2009  . ABNORMAL EXAM-BILIARY TRACT 12/14/2009  . LACTOSE INTOLERANCE 10/31/2009  . Abdominal pain, epigastric  10/31/2009  . ELBOW PAIN 03/31/2009  . TOBACCO USE, QUIT 03/31/2009  . SWELLING MASS OR LUMP IN HEAD AND NECK 01/29/2008  . SINUSITIS- ACUTE-NOS 01/08/2008  . KNEE PAIN 06/24/2007  . ANXIETY 03/11/2007  . PARESTHESIA 03/11/2007  . ALLERGIC RHINITIS 01/03/2007  . GERD 01/03/2007  . Dyslipidemia 06/25/2006  . Obesity 06/25/2006  . DEPRESSION 06/25/2006  . OSTEOARTHRITIS 06/25/2006    Past Surgical History:  Procedure Laterality Date  . CHOLECYSTECTOMY N/A 09/12/2015   Procedure: LAPAROSCOPIC CHOLECYSTECTOMY WITH INTRAOPERATIVE CHOLANGIOGRAM;  Surgeon: Jackolyn Confer, MD;  Location: [facility];  Service: General;  Laterality: N/A;  . COLONOSCOPY WITH PROPOFOL N/A 03/08/2016   Procedure: COLONOSCOPY WITH PROPOFOL;  Surgeon: Otis Brace, MD;  Location: [facility];  Service: Gastroenterology;  Laterality: N/A;  . FOOT SURGERY      OB History    Gravida  1   Para      Term      Preterm      AB      Living        SAB      TAB      Ectopic      Multiple      Live Births               Home Medications    Prior to Admission medications   Medication Sig Start Date End Date Taking? Authorizing Provider  fluconazole (DIFLUCAN) 150 MG tablet  1 by mouth every 3 days as needed 01/31/17   Biagio Borg, MD  folic acid (FOLVITE) 1 MG tablet Take 1 mg by mouth daily. 08/22/15   [provider]  ibuprofen (ADVIL,MOTRIN) 800 MG tablet Take 1 tablet (800 mg total) by mouth 3 (three) times daily. 12/21/17   Marie Borowski C, PA-C  ipratropium (ATROVENT) 0.06 % nasal spray Place 2 sprays into both nostrils 4 (four) times daily. 02/24/16   Billy Fischer, MD  pantoprazole (PROTONIX) 40 MG tablet Take 1 tablet (40 mg total) by mouth daily. 09/28/14   Plotnikov, Evie Lacks, MD  triamcinolone cream (KENALOG) 0.5 % Apply 1 application topically 3 (three) times daily as needed (for rash). 12/06/16   Plotnikov, Evie Lacks, MD  Vitamin D, Ergocalciferol, (DRISDOL) 50000 UNITS CAPS  capsule TAKE ONE CAPSULE BY MOUTH ONCE A WEEK 11/29/14   Plotnikov, Evie Lacks, MD    Family History Family History  Problem Relation Age of Onset  . Cancer Father        throat  . Diabetes Other   . Cholelithiasis Other   . Breast cancer Maternal Aunt        ? age of onset    Social History Social History   Tobacco Use  . Smoking status: Former Research scientist (life sciences)  . Smokeless tobacco: Never Used  . Tobacco comment: quit them "long time ago"  Substance Use Topics  . Alcohol use: No  . Drug use: No     Allergies   Penicillins and Tramadol   Review of Systems Review of Systems  Constitutional: Negative for fatigue and fever.  Respiratory: Negative for shortness of breath.   Cardiovascular: Negative for chest pain.  Gastrointestinal: Negative for nausea and vomiting.  Musculoskeletal: Positive for arthralgias and myalgias. Negative for joint swelling, neck pain and neck stiffness.  Skin: Negative for color change, pallor and wound.  Neurological: Positive for weakness. Negative for light-headedness, numbness and headaches.     Physical Exam Triage Vital Signs ED Triage Vitals  Enc Vitals Group     BP 12/21/17 1123 119/60     Pulse Rate 12/21/17 1123 83     Resp 12/21/17 1123 20     Temp 12/21/17 1123 98.1 F (36.7 C)     Temp Source 12/21/17 1123 Oral     SpO2 12/21/17 1123 100 %     Weight --      Height --      Head Circumference --      Peak Flow --      Pain Score 12/21/17 1117 5     Pain Loc --      Pain Edu? --      Excl. in [place]? --    No data found.  Updated Vital Signs BP 119/60 (BP Location: Right Arm) Comment: large cuff  Pulse 83   Temp 98.1 F (36.7 C) (Oral)   Resp 20   SpO2 100%   Visual Acuity Right Eye Distance:   Left Eye Distance:   Bilateral Distance:    Right Eye Near:   Left Eye Near:    Bilateral Near:     Physical Exam  Constitutional: She is oriented to person, place, and time. She appears well-developed and well-nourished.  No  acute distress  HENT:  Head: Normocephalic and atraumatic.  Nose: Nose normal.  Eyes: Conjunctivae are normal.  Neck: Neck supple.  Cardiovascular: Normal rate.  Pulmonary/Chest: Effort normal. No respiratory distress.  Abdominal: She exhibits no  distension.  Musculoskeletal: Normal range of motion.  Tenderness to palpation over the thenar eminence of left hand as well as over anatomical snuffbox, nontender to palpation of distal radius and ulna, nontender to palpation over first through fifth metacarpals.  Patient unable to make a fist/positive Finkelstein's.  Grip strength is decreased.  Neurological: She is alert and oriented to person, place, and time.  Skin: Skin is warm and dry.  Psychiatric: She has a normal mood and affect.  Nursing note and vitals reviewed.    UC Treatments / Results  Labs (all labs ordered are listed, but only abnormal results are displayed) Labs Reviewed - No data to display  EKG None  Radiology Dg Hand Complete Left  Result Date: 12/21/2017 CLINICAL DATA:  52 year old female with injury to the left hand complaining of left hand pain. EXAM: LEFT HAND - COMPLETE 3+ VIEW COMPARISON:  No priors. FINDINGS: There is no evidence of fracture or dislocation. There is no evidence of arthropathy or other focal bone abnormality. Soft tissues are unremarkable. IMPRESSION: Negative. Electronically Signed   By: Vinnie Langton M.D.   On: 12/21/2017 12:05    Procedures Procedures (including critical care time)  Medications Ordered in UC Medications - No data to display  Initial Impression / Assessment and Plan / UC Course  I have reviewed the triage vital signs and the nursing notes.  Pertinent labs & imaging results that were available during my care of the patient were reviewed by me and considered in my medical decision making (see chart for details).     X-ray negative, possible sprain of thumb versus de Quervain's.  Will provide thumb spica, NSAIDs,  ice.  Discussed strict return precautions. Patient verbalized understanding and is agreeable with plan.  Final Clinical Impressions(s) / UC Diagnoses   Final diagnoses:  Left hand pain     Discharge Instructions     Use anti-inflammatories for pain/swelling. You may take up to 800 mg Ibuprofen every 8 hours with food. You may supplement Ibuprofen with Tylenol [phone] mg every 8 hours.   Please wear wrist brace for the next 2 weeks, may slowly transition out of this as pain improving  Ice thumb  Follow-up if symptoms not improving or worsening in 2 weeks    ED Prescriptions    Medication Sig Dispense Auth. Provider   ibuprofen (ADVIL,MOTRIN) 800 MG tablet Take 1 tablet (800 mg total) by mouth 3 (three) times daily. 21 tablet Gerrald Basu, [place] C, PA-C     Controlled Substance Prescriptions [place] Controlled Substance Registry consulted? Not Applicable   Janith Lima, [place] 12/21/17 1222

## 2017-12-21 NOTE — Discharge Instructions (Signed)
Use anti-inflammatories for pain/swelling. You may take up to 800 mg Ibuprofen every 8 hours with food. You may supplement Ibuprofen with Tylenol [phone] mg every 8 hours.   Please wear wrist brace for the next 2 weeks, may slowly transition out of this as pain improving  Ice thumb  Follow-up if symptoms not improving or worsening in 2 weeks

## 2018-07-03 DIAGNOSIS — J040 Acute laryngitis: Secondary | ICD-10-CM | POA: Diagnosis not present

## 2018-07-03 DIAGNOSIS — J069 Acute upper respiratory infection, unspecified: Secondary | ICD-10-CM | POA: Diagnosis not present

## 2018-11-05 DIAGNOSIS — N939 Abnormal uterine and vaginal bleeding, unspecified: Secondary | ICD-10-CM | POA: Diagnosis not present

## 2018-11-05 DIAGNOSIS — Z6841 Body Mass Index (BMI) 40.0 and over, adult: Secondary | ICD-10-CM | POA: Diagnosis not present

## 2018-11-09 ENCOUNTER — Ambulatory Visit (INDEPENDENT_AMBULATORY_CARE_PROVIDER_SITE_OTHER): Payer: Federal, State, Local not specified - PPO | Admitting: Internal Medicine

## 2018-11-09 ENCOUNTER — Encounter: Payer: Self-pay | Admitting: Internal Medicine

## 2018-11-09 DIAGNOSIS — E559 Vitamin D deficiency, unspecified: Secondary | ICD-10-CM

## 2018-11-09 DIAGNOSIS — K219 Gastro-esophageal reflux disease without esophagitis: Secondary | ICD-10-CM | POA: Diagnosis not present

## 2018-11-09 DIAGNOSIS — J309 Allergic rhinitis, unspecified: Secondary | ICD-10-CM | POA: Diagnosis not present

## 2018-11-09 DIAGNOSIS — E785 Hyperlipidemia, unspecified: Secondary | ICD-10-CM | POA: Diagnosis not present

## 2018-11-09 DIAGNOSIS — E538 Deficiency of other specified B group vitamins: Secondary | ICD-10-CM

## 2018-11-09 DIAGNOSIS — M170 Bilateral primary osteoarthritis of knee: Secondary | ICD-10-CM

## 2018-11-09 MED ORDER — PANTOPRAZOLE SODIUM 40 MG PO TBEC: 40.0000 mg | DELAYED_RELEASE_TABLET | Freq: Every day | ORAL | 3 refills | Status: DC

## 2018-11-09 MED ORDER — HYDROCODONE-ACETAMINOPHEN 5-325 MG PO TABS: 1.0000 | ORAL_TABLET | Freq: Four times a day (QID) | ORAL | 0 refills | Status: DC | PRN

## 2018-11-09 NOTE — Assessment & Plan Note (Addendum)
Severe pain in the knees at times.  Tylenol PRN.  She will take Norco as needed on rare occasion.  Potential benefits of a short/long term opioids use as well as potential risks (i.e. addiction risk, apnea etc) and complications (i.e. Somnolence, constipation and others) were explained to the patient and were aknowledged.

## 2018-11-09 NOTE — Progress Notes (Signed)
Virtual Visit via Video Note  I connected with Charlotte Meyer on 11/09/18 at 10:40 AM EDT by a video enabled telemedicine application and verified that I am speaking with the correct person using two identifiers.   I discussed the limitations of evaluation and management by telemedicine and the availability of in person appointments. The patient expressed understanding and agreed to proceed.  History of Present Illness: We need to follow-up on OA, knee pain, GERD.  the patient is complaining of severe pain in both knees.  On occasion she will need a stronger medicine.  On the days she takes Tylenol.  Ibuprofen and naproxen do not seem to help.  Graziella works in [place] -it is hard for her to make time to see a specialist  There has been runny nose, nochest pain, shortness of breath, abdominal pain, diarrhea, constipation,  skin rashes.   Observations/Objective: The patient appears to be in no acute distress, looks well.  Assessment and Plan:  See my Assessment and Plan. Follow Up Instructions:    I discussed the assessment and treatment plan with the patient. The patient was provided an opportunity to ask questions and all were answered. The patient agreed with the plan and demonstrated an understanding of the instructions.   The patient was advised to call back or seek an in-person evaluation if the symptoms worsen or if the condition fails to improve as anticipated.  I provided face-to-face time during this encounter. We were at different locations.   Walker Kehr, MD

## 2018-11-09 NOTE — Assessment & Plan Note (Signed)
Labs

## 2018-11-09 NOTE — Assessment & Plan Note (Signed)
The patient is not taking vitamin D.  Check vitamin D level.

## 2018-11-09 NOTE — Assessment & Plan Note (Signed)
Protonix renewed.  

## 2018-11-09 NOTE — Assessment & Plan Note (Signed)
She is using over-the-counter meds

## 2018-11-09 NOTE — Assessment & Plan Note (Signed)
Check B12 level. 

## 2018-11-13 ENCOUNTER — Other Ambulatory Visit (INDEPENDENT_AMBULATORY_CARE_PROVIDER_SITE_OTHER): Payer: Federal, State, Local not specified - PPO

## 2018-11-13 DIAGNOSIS — E538 Deficiency of other specified B group vitamins: Secondary | ICD-10-CM | POA: Diagnosis not present

## 2018-11-13 DIAGNOSIS — E559 Vitamin D deficiency, unspecified: Secondary | ICD-10-CM

## 2018-11-13 DIAGNOSIS — E785 Hyperlipidemia, unspecified: Secondary | ICD-10-CM

## 2018-11-13 LAB — LIPID PANEL
Cholesterol: 201 mg/dL — ABNORMAL HIGH (ref 0–200)
HDL: 75.4 mg/dL (ref >39.00)
LDL Cholesterol: 112 mg/dL — ABNORMAL HIGH (ref 0–99)
NonHDL: 125.22
Total CHOL/HDL Ratio: 3
Triglycerides: 64 mg/dL (ref 0.0–149.0)
VLDL: 12.8 mg/dL (ref 0.0–40.0)

## 2018-11-13 LAB — CBC WITH DIFFERENTIAL/PLATELET
Basophils Absolute: 0.1 10*3/uL (ref 0.0–0.1)
Basophils Relative: 0.9 % (ref 0.0–3.0)
Eosinophils Absolute: 0.1 10*3/uL (ref 0.0–0.7)
Eosinophils Relative: 1.7 % (ref 0.0–5.0)
HCT: 35.1 % — ABNORMAL LOW (ref 36.0–46.0)
Hemoglobin: 11.7 g/dL — ABNORMAL LOW (ref 12.0–15.0)
Lymphocytes Relative: 39.7 % (ref 12.0–46.0)
Lymphs Abs: 2.4 10*3/uL (ref 0.7–4.0)
MCHC: 33.3 g/dL (ref 30.0–36.0)
MCV: 85.1 fl (ref 78.0–100.0)
Monocytes Absolute: 0.5 10*3/uL (ref 0.1–1.0)
Monocytes Relative: 7.8 % (ref 3.0–12.0)
Neutro Abs: 3 10*3/uL (ref 1.4–7.7)
Neutrophils Relative %: 49.9 % (ref 43.0–77.0)
Platelets: 199 10*3/uL (ref 150.0–400.0)
RBC: 4.12 Mil/uL (ref 3.87–5.11)
RDW: 16.8 % — ABNORMAL HIGH (ref 11.5–15.5)
WBC: 6 10*3/uL (ref 4.0–10.5)

## 2018-11-13 LAB — URINALYSIS, ROUTINE W REFLEX MICROSCOPIC
Bilirubin Urine: NEGATIVE
Ketones, ur: NEGATIVE
Nitrite: NEGATIVE
Specific Gravity, Urine: 1.025 (ref 1.000–1.030)
Total Protein, Urine: NEGATIVE
Urine Glucose: NEGATIVE
Urobilinogen, UA: 0.2 (ref 0.0–1.0)
pH: 6 (ref 5.0–8.0)

## 2018-11-13 LAB — TSH: TSH: 2.02 u[IU]/mL (ref 0.35–4.50)

## 2018-11-13 LAB — VITAMIN B12: Vitamin B-12: 230 pg/mL (ref 211–911)

## 2018-11-13 LAB — BASIC METABOLIC PANEL
BUN: 8 mg/dL (ref 6–23)
CO2: 22 mEq/L (ref 19–32)
Calcium: 8.8 mg/dL (ref 8.4–10.5)
Chloride: 106 mEq/L (ref 96–112)
Creatinine, Ser: 0.6 mg/dL (ref 0.40–1.20)
GFR: 126.43 mL/min (ref >60.00)
Glucose, Bld: 95 mg/dL (ref 70–99)
Potassium: 4.1 mEq/L (ref 3.5–5.1)
Sodium: 139 mEq/L (ref 135–145)

## 2018-11-13 LAB — HEPATIC FUNCTION PANEL
ALT: 8 U/L (ref 0–35)
AST: 9 U/L (ref 0–37)
Albumin: 3.9 g/dL (ref 3.5–5.2)
Alkaline Phosphatase: 81 U/L (ref 39–117)
Bilirubin, Direct: 0.2 mg/dL (ref 0.0–0.3)
Total Bilirubin: 0.9 mg/dL (ref 0.2–1.2)
Total Protein: 6.9 g/dL (ref 6.0–8.3)

## 2018-11-13 LAB — VITAMIN D 25 HYDROXY (VIT D DEFICIENCY, FRACTURES): VITD: 21.1 ng/mL — ABNORMAL LOW (ref 30.00–100.00)

## 2018-11-16 ENCOUNTER — Other Ambulatory Visit: Payer: Self-pay | Admitting: Internal Medicine

## 2018-11-16 MED ORDER — VITAMIN D3 50 MCG (2000 UT) PO CAPS: 2000.0000 [IU] | ORAL_CAPSULE | Freq: Every day | ORAL | 3 refills | Status: DC

## 2018-11-16 MED ORDER — CIPROFLOXACIN HCL 250 MG PO TABS: 250.0000 mg | ORAL_TABLET | Freq: Two times a day (BID) | ORAL | 0 refills | Status: DC

## 2018-11-16 MED ORDER — FLUCONAZOLE 150 MG PO TABS: ORAL_TABLET | ORAL | 1 refills | Status: DC

## 2018-11-16 MED ORDER — B COMPLEX PO TABS: 1.0000 | ORAL_TABLET | Freq: Every day | ORAL | 3 refills | Status: DC

## 2018-11-16 MED ORDER — VITAMIN D3 1.25 MG (50000 UT) PO CAPS: 1.0000 | ORAL_CAPSULE | ORAL | 0 refills | Status: DC

## 2019-01-04 ENCOUNTER — Other Ambulatory Visit: Payer: Self-pay | Admitting: Internal Medicine

## 2019-01-09 ENCOUNTER — Other Ambulatory Visit: Payer: Self-pay | Admitting: Internal Medicine

## 2019-01-13 ENCOUNTER — Telehealth: Payer: Self-pay | Admitting: Internal Medicine

## 2019-01-13 NOTE — Telephone Encounter (Signed)
Copied from [place] [id number]. Topic: Quick Communication - Rx Refill/Question >> Jan 13, 2019  4:14 PM Nils Flack, Marland Kitchen wrote: Medication: alprazolam  Has the patient contacted their pharmacy? Yes.   (Agent: If no, request that the patient contact the pharmacy for the refill.) (Agent: If yes, when and what did the pharmacy advise?)  Preferred Pharmacy (with phone number or street name): [facility] [address]   Transmission to pharmacy failed, needs to be sent again  Agent: Please be advised that RX refills may take up to 3 business days. We ask that you follow-up with your pharmacy.

## 2019-01-14 NOTE — Telephone Encounter (Signed)
01/10/19 refill did not go through. Transmission to pharmacy failed, needs to be sent again

## 2019-01-29 NOTE — Telephone Encounter (Signed)
Patient is still waiting on RX still, please send again.

## 2019-01-30 MED ORDER — ALPRAZOLAM 0.5 MG PO TABS: 0.5000 mg | ORAL_TABLET | Freq: Three times a day (TID) | ORAL | 1 refills | Status: DC | PRN

## 2019-01-30 NOTE — Addendum Note (Signed)
Addended by: Cassandria Anger on: 01/30/2019 10:18 AM   Modules accepted: Orders

## 2019-01-30 NOTE — Telephone Encounter (Signed)
The prescription was sent again.  Thanks

## 2019-04-14 ENCOUNTER — Other Ambulatory Visit: Payer: Self-pay

## 2019-04-14 ENCOUNTER — Ambulatory Visit: Payer: Federal, State, Local not specified - PPO | Admitting: Podiatry

## 2019-04-14 DIAGNOSIS — M7661 Achilles tendinitis, right leg: Secondary | ICD-10-CM

## 2019-04-14 DIAGNOSIS — M7662 Achilles tendinitis, left leg: Secondary | ICD-10-CM

## 2019-04-14 MED ORDER — METHYLPREDNISOLONE 4 MG PO TBPK: ORAL_TABLET | ORAL | 0 refills | Status: DC

## 2019-04-19 ENCOUNTER — Ambulatory Visit: Payer: Self-pay | Admitting: Podiatry

## 2019-04-19 ENCOUNTER — Other Ambulatory Visit: Payer: Self-pay

## 2019-04-19 ENCOUNTER — Ambulatory Visit (INDEPENDENT_AMBULATORY_CARE_PROVIDER_SITE_OTHER): Payer: Federal, State, Local not specified - PPO | Admitting: Orthotics

## 2019-04-19 DIAGNOSIS — M7661 Achilles tendinitis, right leg: Secondary | ICD-10-CM

## 2019-04-19 DIAGNOSIS — M7662 Achilles tendinitis, left leg: Secondary | ICD-10-CM

## 2019-04-19 NOTE — Progress Notes (Signed)
   Patient ID: Charlotte Meyer, female   DOB: 1965/12/08, 53 y.o.   MRN: [id number] Subjective:  53 year old female presenting today for follow-up evaluation of insertional Achilles tendinitis to the bilateral lower extremities. She states the pain is the same as it was in the beginning. She has been taking OTC Aleve for treatment which helps alleviate her symptoms. Being on her feet for long periods of time increases the pain. Patient is here for further evaluation and treatment.   Past Medical History:  Diagnosis Date  . Allergic rhinitis   . Anemia   . Depression    treated with zoloft in past  . GERD (gastroesophageal reflux disease)   . History of UTI   . Hyperlipemia    no treatment required  . Lactose intolerance   . OA (osteoarthritis) of knee    bilaterally   . Obesity      Objective/Physical Exam General: The patient is alert and oriented x3 in no acute distress.  Dermatology: Skin is warm, dry and supple bilateral lower extremities. Negative for open lesions or macerations.  Vascular: Palpable pedal pulses bilaterally. No edema or erythema noted. Capillary refill within normal limits.  Neurological: Epicritic and protective threshold grossly intact bilaterally.   Musculoskeletal Exam: Significant pain on palpation noted to the posterior lateral aspect of the posterior tubercle calcaneus bilateral. There is also a large palpable Haglund's deformity noted to the bilateral posterior heels as well.  Pain on palpation also noted to the insertion of the Achilles tendon throughout the superior aspect of the posterior tubercle of the calcaneus bilateral.  Range of motion within normal limits to all pedal and ankle joints bilateral. Muscle strength 5/5 in all groups bilateral.    Assessment: #1 Haglund's deformity bilateral #2 insertional Achilles tendinitis bilateral #3 retrocalcaneal bursitis bilateral #4 posterior heel spurring with calcific bodies bilateral lower  extremities #5 bilateral heel pain   Plan of Care:  #1 Patient was evaluated. #2 injection of 0.5 mL Celestone Soluspan injected into the retrocalcaneal bursa of the bilateral lower extremities. Care was taken to avoid direct injection into the Achilles tendon's.  #3 Prescription for Medrol Dose Pak provided to patient. Then continue taking OTC Aleve.  #4 Appointment with Liliane Channel, Pedorthist, for custom molded orthotics.  #5 Return to clinic as needed.   Works at [employer].     Edrick Kins, DPM [facility]  Dr. Edrick Kins, DPM    [address], [place] [address]                Office [phone]  Fax [phone]

## 2019-04-19 NOTE — Progress Notes (Signed)
pateint seen today for f/o to address achilles tendonitis.  Plan on functional device w/ 1/4" heel raise.

## 2019-05-18 ENCOUNTER — Other Ambulatory Visit: Payer: Self-pay

## 2019-05-18 ENCOUNTER — Ambulatory Visit: Payer: Federal, State, Local not specified - PPO | Admitting: Orthotics

## 2019-05-18 DIAGNOSIS — M7662 Achilles tendinitis, left leg: Secondary | ICD-10-CM

## 2019-05-18 DIAGNOSIS — Z20822 Contact with and (suspected) exposure to covid-19: Secondary | ICD-10-CM

## 2019-05-18 DIAGNOSIS — M549 Dorsalgia, unspecified: Secondary | ICD-10-CM

## 2019-05-18 NOTE — Progress Notes (Signed)
Patient came in today to pick up custom made foot orthotics.  The goals were accomplished and the patient reported no dissatisfaction with said orthotics.  Patient was advised of breakin period and how to report any issues. 

## 2019-05-19 LAB — NOVEL CORONAVIRUS, NAA: SARS-CoV-2, NAA: NOT DETECTED

## 2019-08-26 ENCOUNTER — Other Ambulatory Visit: Payer: Self-pay | Admitting: Orthopedic Surgery

## 2019-08-26 DIAGNOSIS — M1990 Unspecified osteoarthritis, unspecified site: Secondary | ICD-10-CM

## 2019-08-26 DIAGNOSIS — M79671 Pain in right foot: Secondary | ICD-10-CM

## 2019-08-26 DIAGNOSIS — M25571 Pain in right ankle and joints of right foot: Secondary | ICD-10-CM

## 2019-09-14 ENCOUNTER — Ambulatory Visit
Admission: RE | Admit: 2019-09-14 | Discharge: 2019-09-14 | Disposition: A | Discharge status: Home / Self Care | Payer: Federal, State, Local not specified - PPO | Source: Ambulatory Visit | Attending: Orthopedic Surgery | Admitting: Orthopedic Surgery

## 2019-09-14 ENCOUNTER — Other Ambulatory Visit: Payer: Self-pay

## 2019-09-14 DIAGNOSIS — M25571 Pain in right ankle and joints of right foot: Secondary | ICD-10-CM

## 2019-09-14 DIAGNOSIS — M1990 Unspecified osteoarthritis, unspecified site: Secondary | ICD-10-CM

## 2019-09-14 DIAGNOSIS — M79671 Pain in right foot: Secondary | ICD-10-CM

## 2019-09-14 IMAGING — MR MR FOOT*R* W/O CM
4 of 5 series · 18 of 40 positions shown · non-contrast
Comparison: MRI of the right ankle dated [DATE]

CLINICAL DATA: Right medial ankle and foot pain and swelling for 1
month.

EXAM:
MRI OF THE RIGHT FOREFOOT WITHOUT CONTRAST
TECHNIQUE: Multiplanar, multisequence MR imaging of the right forefoot was
performed. No intravenous contrast was administered.

[Series 5: T2 fat-sat · coronal · 3.0mm · 0.22mm/px · 9 of 44 slices shown (1 of 2)]
[im 1/44]
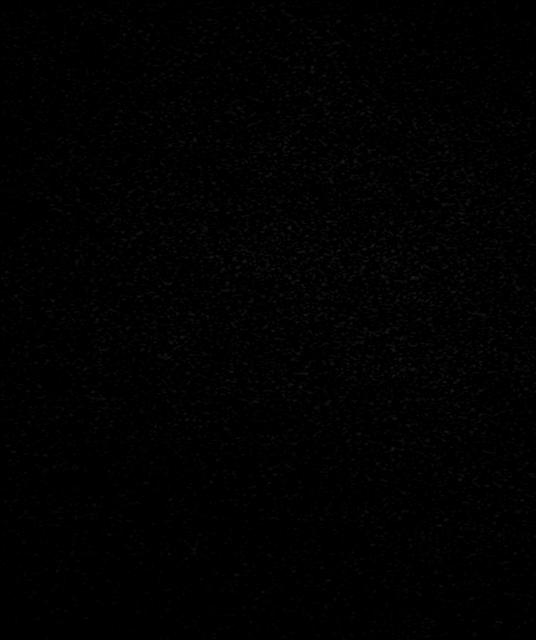
[im 8/44]
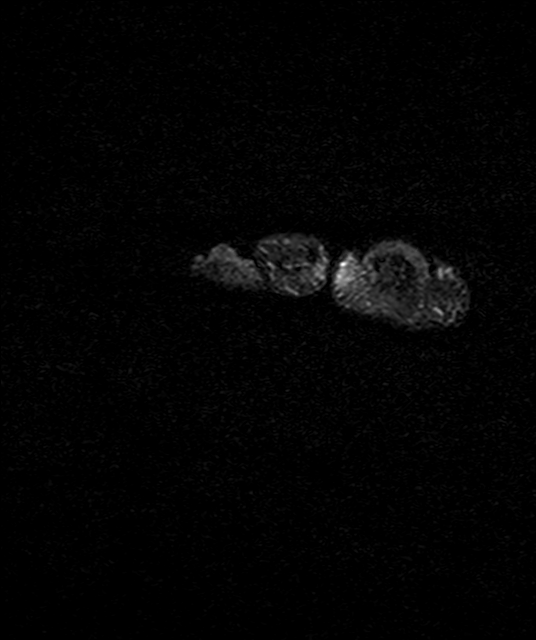
[im 12/44]
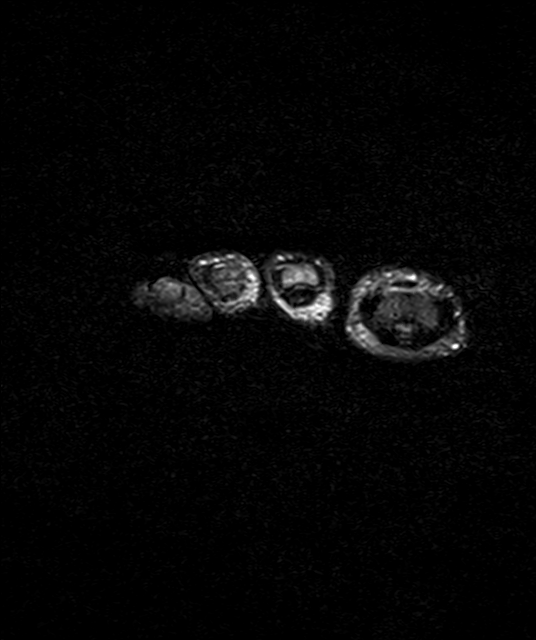
[im 20/44]
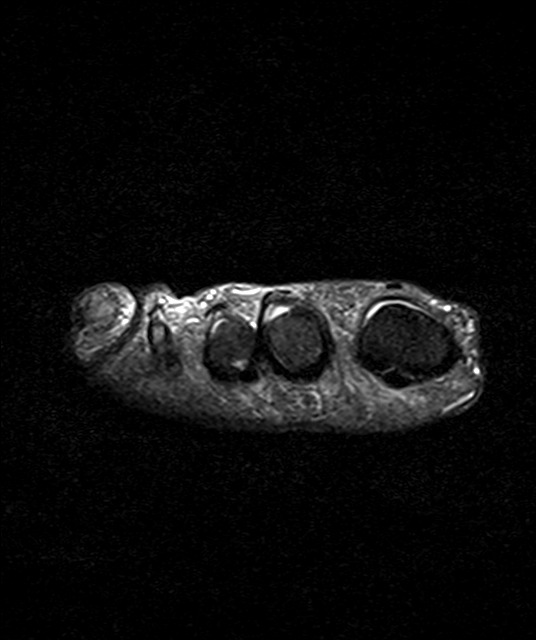
[im 24/44]
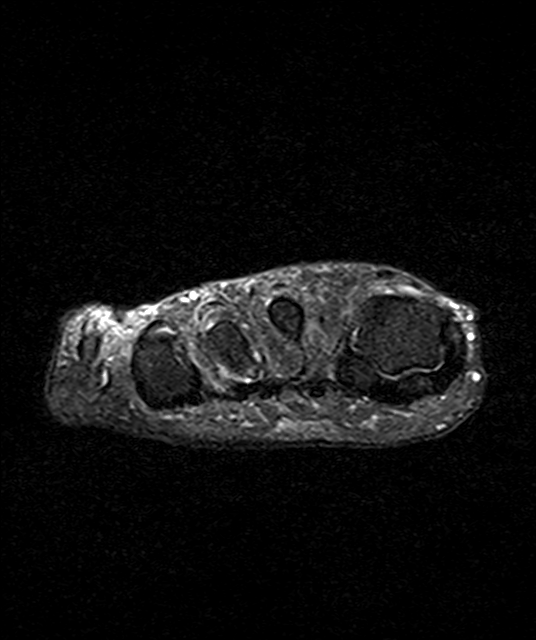
[im 32/44]
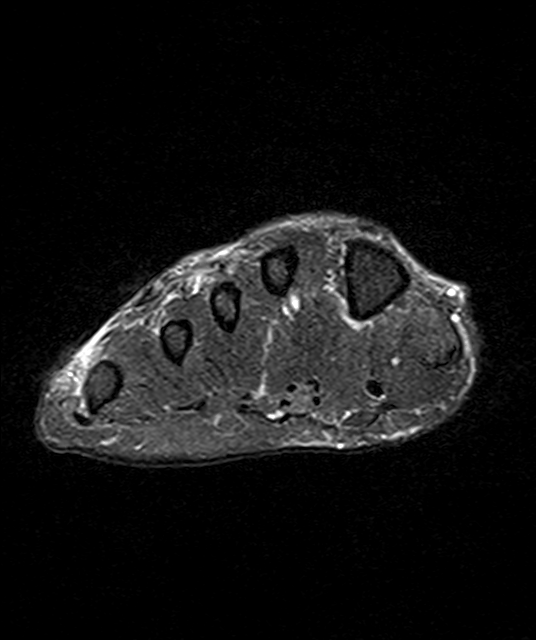
[im 36/44]
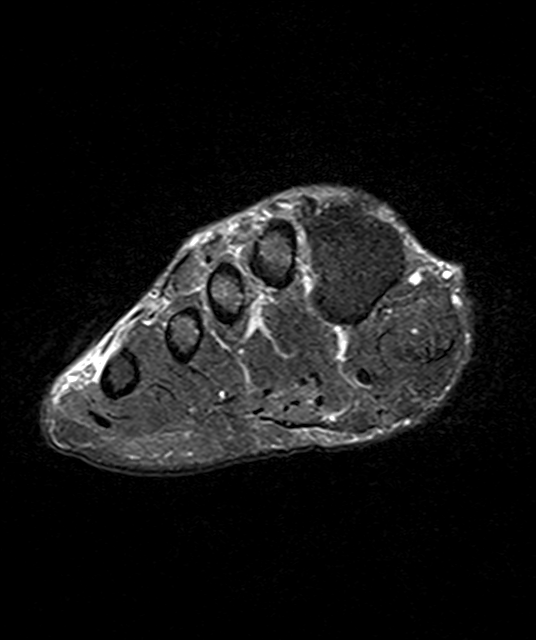
[im 40/44]
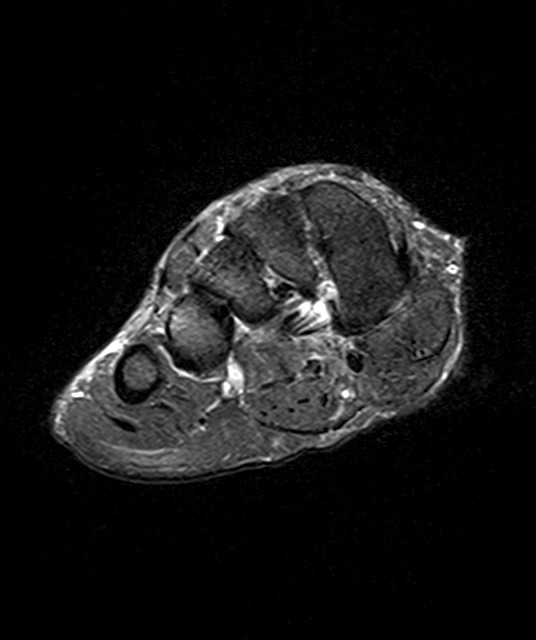
[im 44/44]
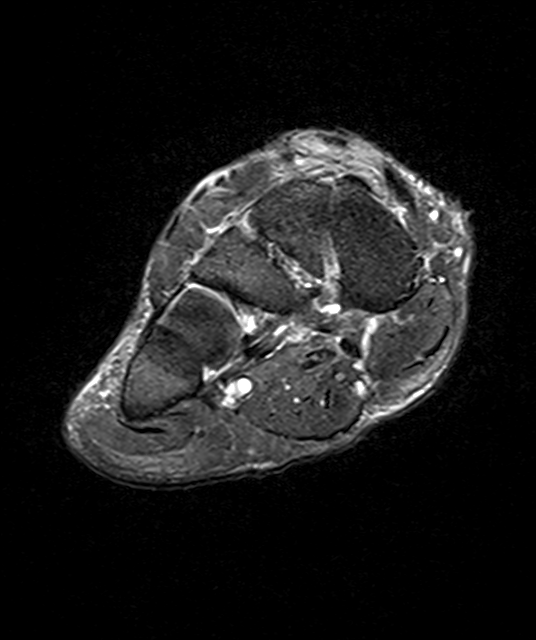

[Series 6: T1 · coronal · 3.0mm · 0.22mm/px · 3 of 44 slices shown (1 of 2)]
[im 5/44]
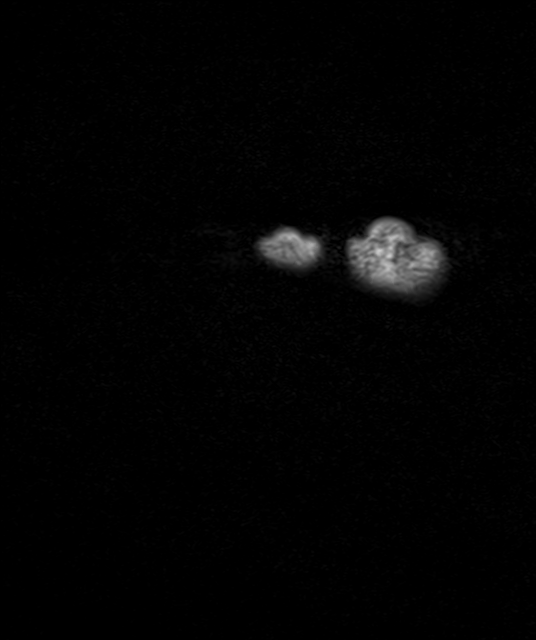
[im 22/44]
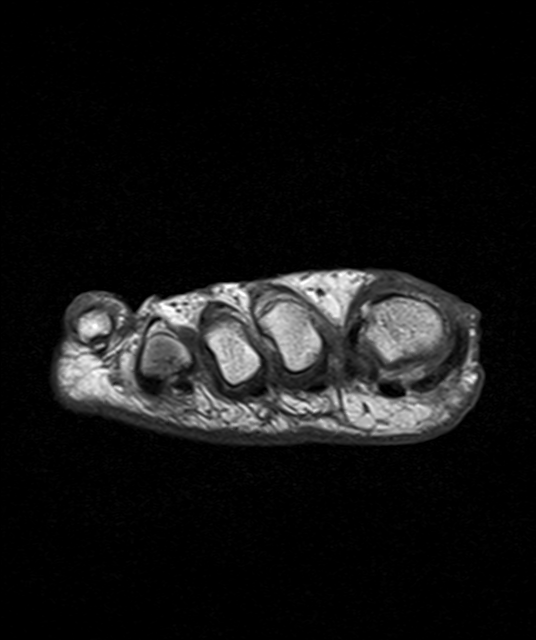
[im 39/44]
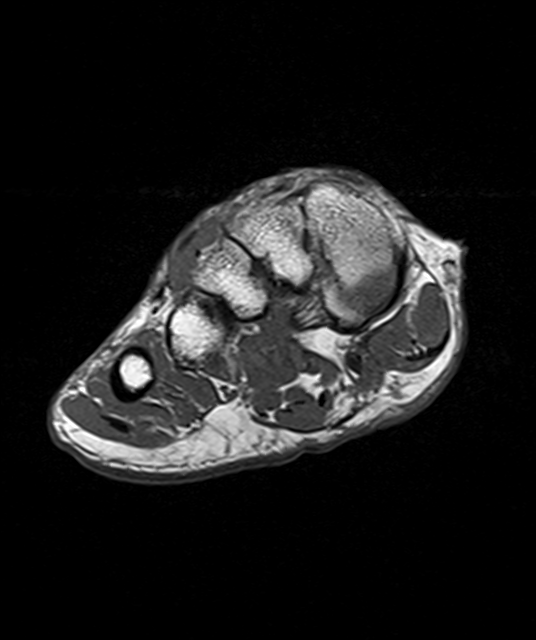

[Series 7: T2 fat-sat · axial · 3.0mm · 0.35mm/px · z∈[-108,-35]mm · 3 of 22 slices shown (2 of 2)]
[im 1/22]
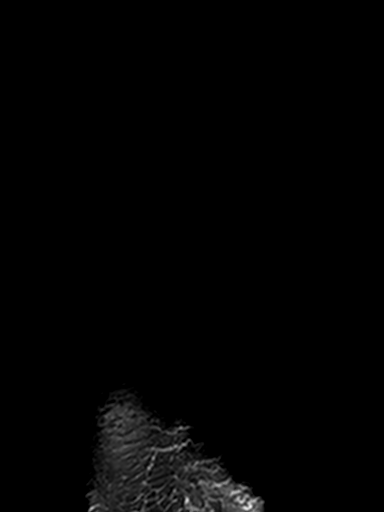
[im 11/22]
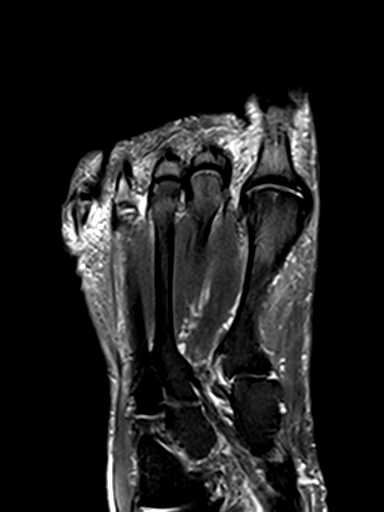
[im 22/22]
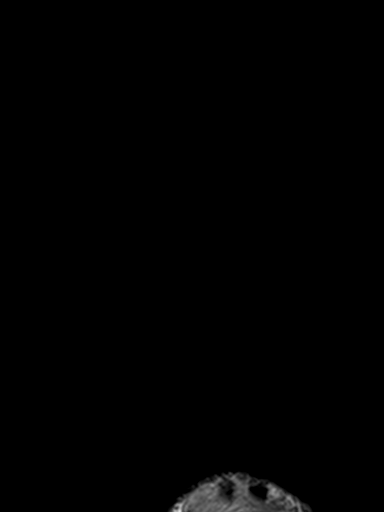

[Series 8: T1 · axial · 3.0mm · 0.35mm/px · z∈[-108,-35]mm · 3 of 22 slices shown (2 of 2)]
[im 1/22]
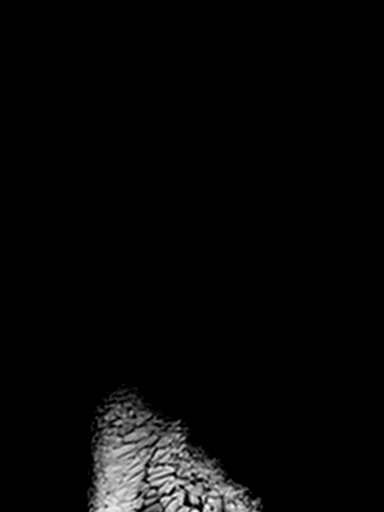
[im 11/22]
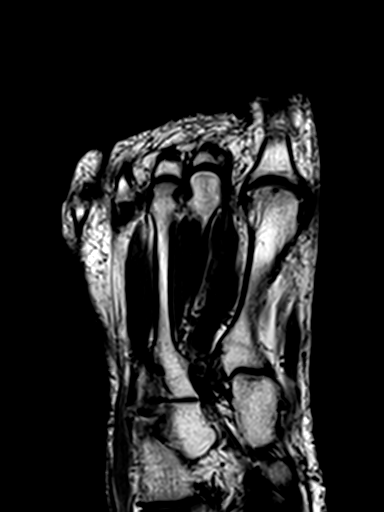
[im 22/22]
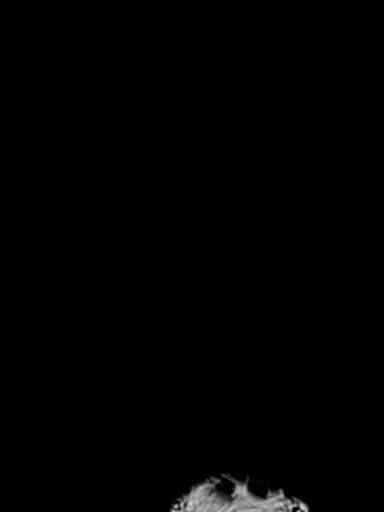

[18 of 40 positions shown; findings below may reference images not displayed]

FINDINGS: Bones/Joint/Cartilage

Dorsal spurring at the talonavicular joint. The bones of the
forefoot are otherwise normal except for minimal degenerative
changes of the first MTP joint.

Ligaments

Normal.

Muscles and Tendons

Normal.

Soft tissues

Normal.
IMPRESSION: Minimal degenerative changes of the first MTP joint. Arthritic
changes at the dorsal aspect of the talonavicular joint. Otherwise,
normal exam.

## 2019-09-14 IMAGING — MR MR ANKLE*R* W/O CM
5 series · 36 of 40 positions shown · non-contrast
Comparison: None.

CLINICAL DATA: Medial right foot and ankle pain and swelling for 1
month.

EXAM:
MRI OF THE RIGHT ANKLE WITHOUT CONTRAST
TECHNIQUE: Multiplanar, multisequence MR imaging of the ankle was performed. No
intravenous contrast was administered.

[Series 4: T2 fat-sat · axial · 3.0mm · 0.50mm/px · z∈[-49,+71]mm · 8 of 32 slices shown (1 of 2)]
[im 1/32]
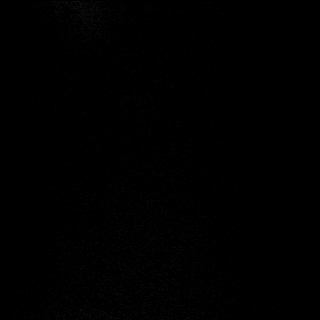
[im 4/32]
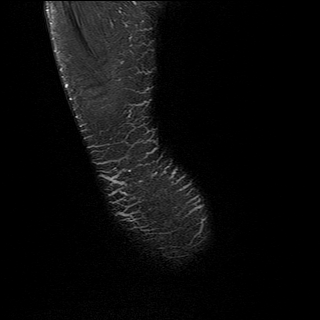
[im 11/32]
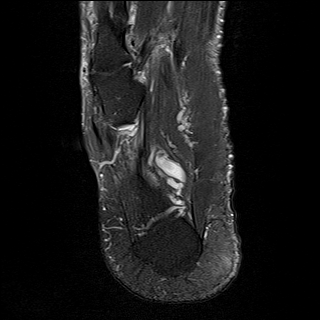
[im 14/32]
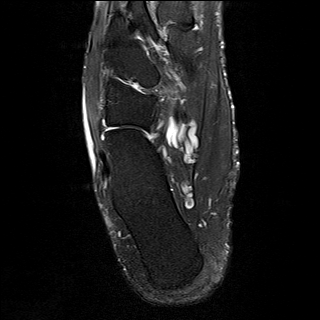
[im 18/32]
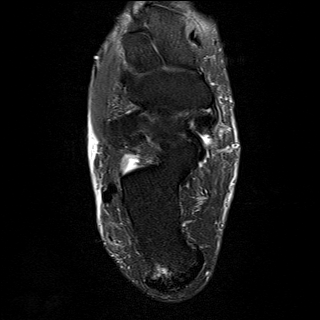
[im 21/32]
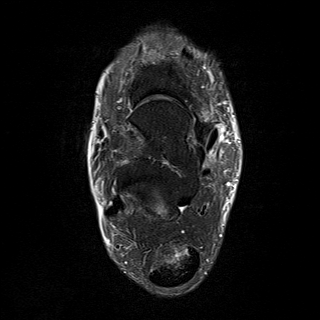
[im 28/32]
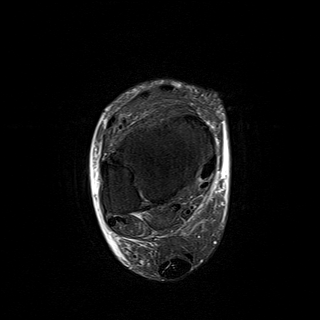
[im 32/32]
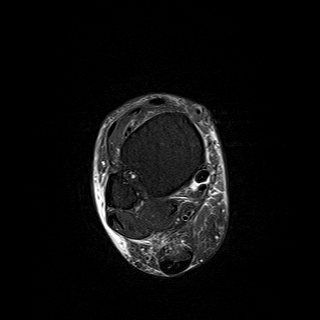

[Series 5: PD fat-sat · axial · 3.0mm · 0.42mm/px · z∈[-49,+71]mm · 10 of 32 slices shown]
[im 1/32]
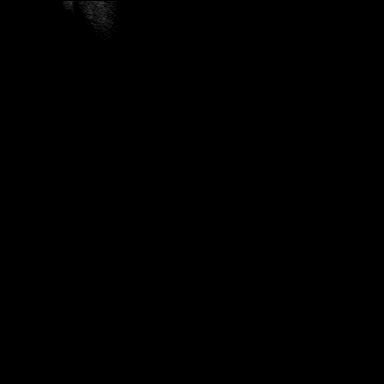
[im 4/32]
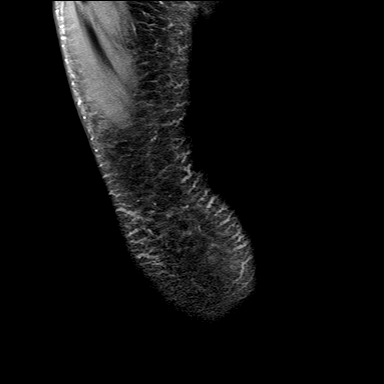
[im 7/32]
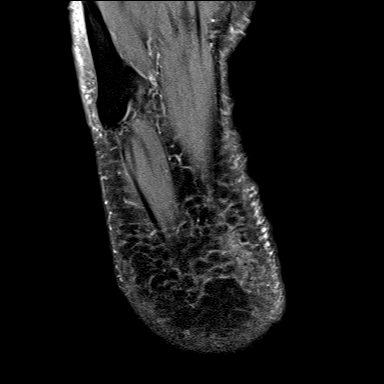
[im 11/32]
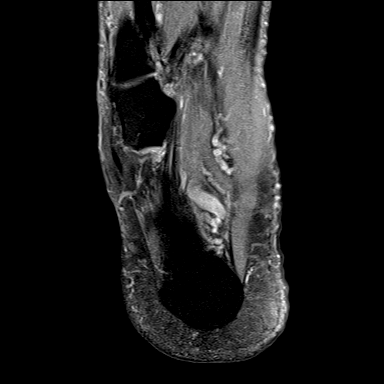
[im 14/32]
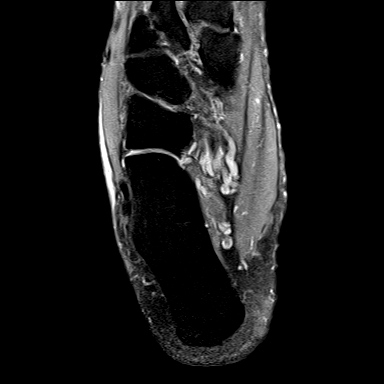
[im 18/32]
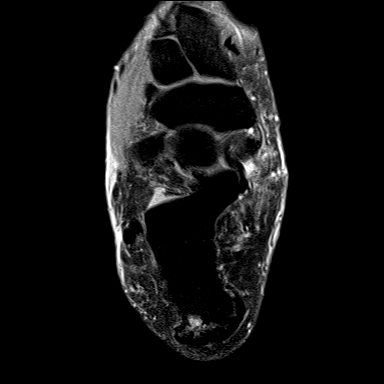
[im 21/32]
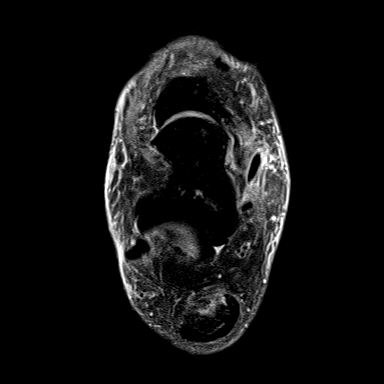
[im 25/32]
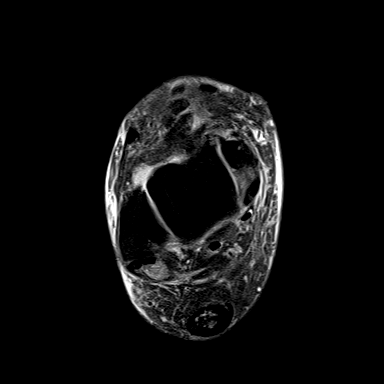
[im 28/32]
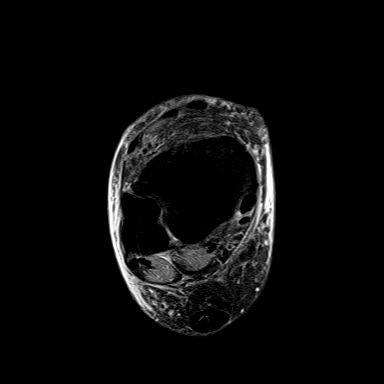
[im 32/32]
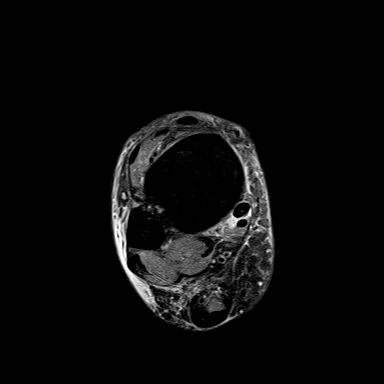

[Series 6: T1 · sagittal · 4.0mm · 0.56mm/px · 5 of 18 slices shown]
[im 1/18]
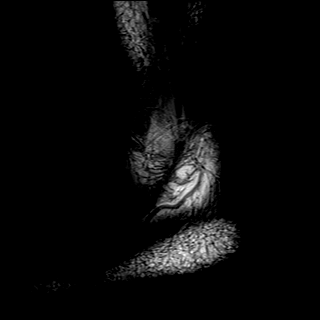
[im 5/18]
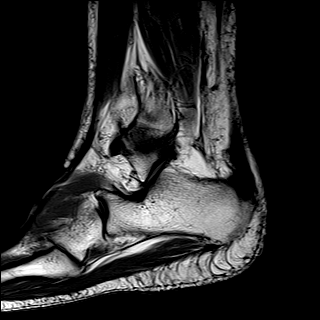
[im 9/18]
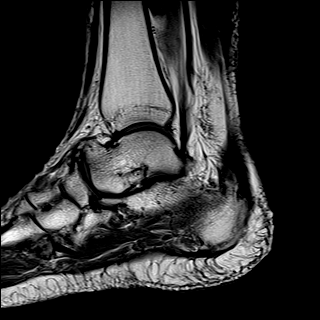
[im 13/18]
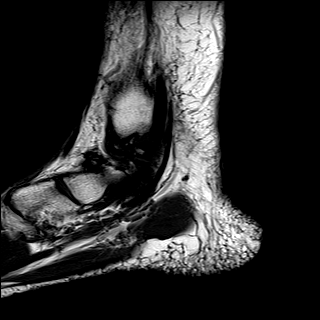
[im 18/18]
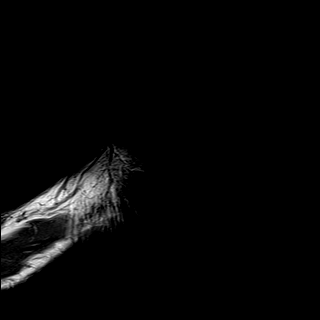

[Series 7: STIR · sagittal · 4.0mm · 0.35mm/px · 5 of 18 slices shown]
[im 1/18]
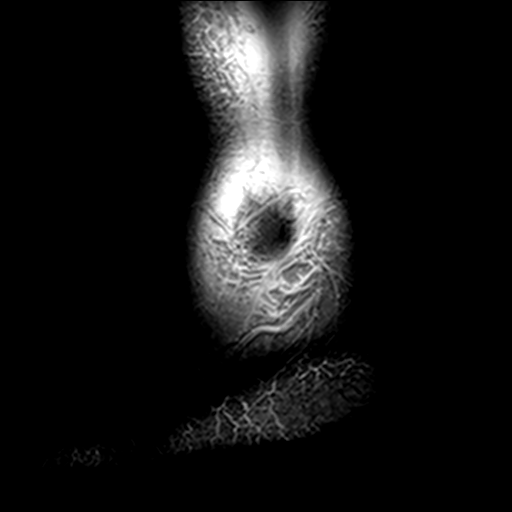
[im 5/18]
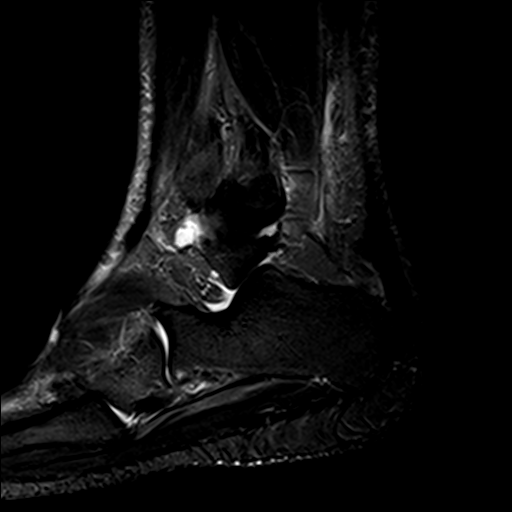
[im 9/18]
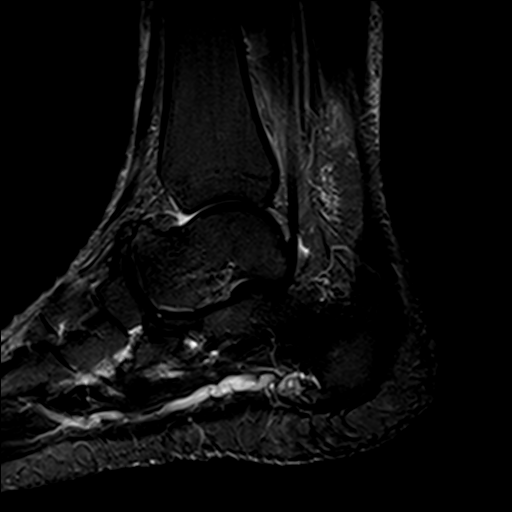
[im 13/18]
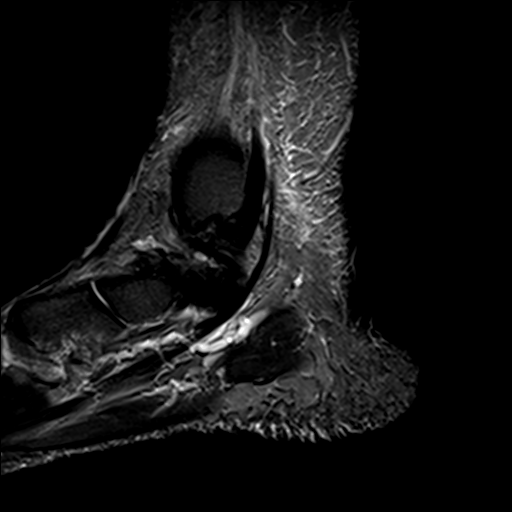
[im 18/18]
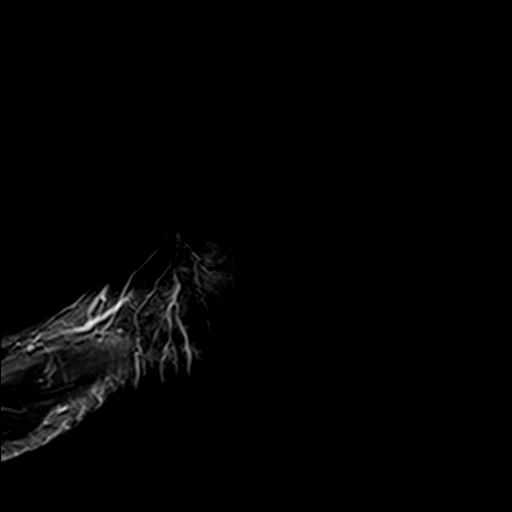

[Series 8: T2 fat-sat · coronal · 3.0mm · 0.50mm/px · 8 of 35 slices shown (2 of 2)]
[im 1/35]
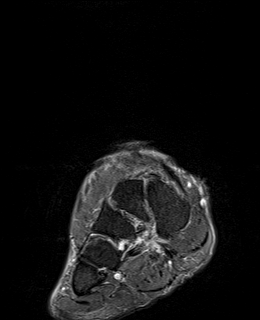
[im 4/35]
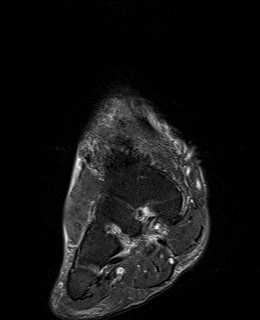
[im 12/35]
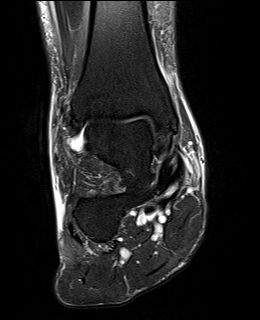
[im 16/35]
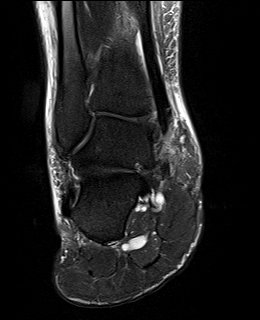
[im 19/35]
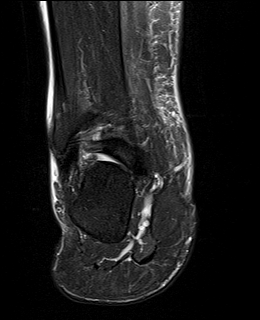
[im 23/35]
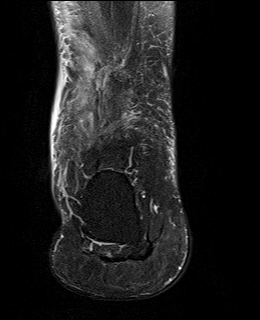
[im 31/35]
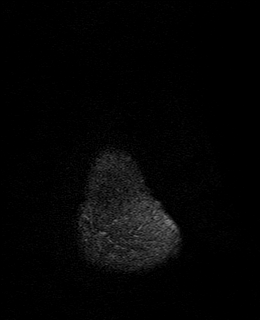
[im 35/35]
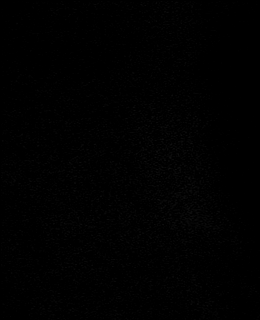

[36 of 40 positions shown; findings below may reference images not displayed]

FINDINGS: TENDONS

Peroneal: Normal.

Posteromedial: Mild tenosynovitis of the posterior tibialis tendon.
There is also slight intrinsic degeneration of the posterior
tibialis just above the medial malleolus. The flexor digitorum
longus and flexor hallucis longus tendons are normal.

Anterior: Normal.

Achilles: Severe degeneration of the distal Achilles tendon.
Prominent ossification in the distal Achilles tendon with disruption
of attachment of the deep fibers of the tendon to the posterior
calcaneus below this ossification with cystic degenerative changes
in the calcaneus at the Achilles insertion.

Plantar Fascia: Normal.

LIGAMENTS

Lateral: The anterior and posterior talofibular ligaments are
intact. Calcaneofibular ligament is not identified.

Medial: Intact.

CARTILAGE

Ankle Joint: No joint effusion or chondral defect.

Subtalar Joints/Sinus Tarsi: No joint effusion or chondral defect.
Spring ligament is intact

Bones: Dorsal spurring at the talonavicular joint. Small effusion at
the calcaneocuboid joint. No appreciable arthritic changes at that
joint.

Other: None
IMPRESSION: 1. Severe degeneration of the distal Achilles tendon with disruption
of attachment of the deep fibers of the tendon to the posterior
calcaneus.
2. Mild tenosynovitis of the posterior tibialis tendon.
3. Dorsal spurring at the talonavicular joint.

## 2019-10-18 ENCOUNTER — Ambulatory Visit: Payer: Federal, State, Local not specified - PPO | Admitting: Family

## 2019-10-30 ENCOUNTER — Ambulatory Visit (INDEPENDENT_AMBULATORY_CARE_PROVIDER_SITE_OTHER)
Admission: EM | Admit: 2019-10-30 | Discharge: 2019-10-30 | Disposition: A | Discharge status: Home / Self Care | Payer: Federal, State, Local not specified - PPO | Source: Home / Self Care

## 2019-10-30 ENCOUNTER — Emergency Department (HOSPITAL_COMMUNITY): Payer: Federal, State, Local not specified - PPO

## 2019-10-30 ENCOUNTER — Other Ambulatory Visit: Payer: Self-pay

## 2019-10-30 ENCOUNTER — Encounter (HOSPITAL_COMMUNITY): Payer: Self-pay | Admitting: Urgent Care

## 2019-10-30 ENCOUNTER — Encounter (HOSPITAL_COMMUNITY): Payer: Self-pay | Admitting: Emergency Medicine

## 2019-10-30 ENCOUNTER — Emergency Department (HOSPITAL_COMMUNITY)
Admission: EM | Admit: 2019-10-30 | Discharge: 2019-10-30 | Disposition: A | Discharge status: Home / Self Care | Payer: Federal, State, Local not specified - PPO | Attending: Emergency Medicine | Admitting: Emergency Medicine

## 2019-10-30 DIAGNOSIS — R1032 Left lower quadrant pain: Secondary | ICD-10-CM | POA: Diagnosis not present

## 2019-10-30 DIAGNOSIS — E669 Obesity, unspecified: Secondary | ICD-10-CM | POA: Insufficient documentation

## 2019-10-30 DIAGNOSIS — N939 Abnormal uterine and vaginal bleeding, unspecified: Secondary | ICD-10-CM

## 2019-10-30 DIAGNOSIS — Z87891 Personal history of nicotine dependence: Secondary | ICD-10-CM | POA: Insufficient documentation

## 2019-10-30 DIAGNOSIS — R102 Pelvic and perineal pain: Secondary | ICD-10-CM | POA: Diagnosis not present

## 2019-10-30 DIAGNOSIS — R10814 Left lower quadrant abdominal tenderness: Secondary | ICD-10-CM | POA: Diagnosis not present

## 2019-10-30 DIAGNOSIS — Z6841 Body Mass Index (BMI) 40.0 and over, adult: Secondary | ICD-10-CM | POA: Insufficient documentation

## 2019-10-30 LAB — COMPREHENSIVE METABOLIC PANEL
ALT: 23 U/L (ref 0–44)
AST: 16 U/L (ref 15–41)
Albumin: 3.3 g/dL — ABNORMAL LOW (ref 3.5–5.0)
Alkaline Phosphatase: 65 U/L (ref 38–126)
Anion gap: 12 (ref 5–15)
BUN: 9 mg/dL (ref 6–20)
CO2: 19 mmol/L — ABNORMAL LOW (ref 22–32)
Calcium: 8.6 mg/dL — ABNORMAL LOW (ref 8.9–10.3)
Chloride: 107 mmol/L (ref 98–111)
Creatinine, Ser: 0.7 mg/dL (ref 0.44–1.00)
GFR calc Af Amer: >60 mL/min (ref >60)
GFR calc non Af Amer: >60 mL/min (ref >60)
Glucose, Bld: 108 mg/dL — ABNORMAL HIGH (ref 70–99)
Potassium: 3.6 mmol/L (ref 3.5–5.1)
Sodium: 138 mmol/L (ref 135–145)
Total Bilirubin: 1.4 mg/dL — ABNORMAL HIGH (ref 0.3–1.2)
Total Protein: 6.7 g/dL (ref 6.5–8.1)

## 2019-10-30 LAB — CBC
HCT: 40.8 % (ref 36.0–46.0)
Hemoglobin: 12.9 g/dL (ref 12.0–15.0)
MCH: 28.3 pg (ref 26.0–34.0)
MCHC: 31.6 g/dL (ref 30.0–36.0)
MCV: 89.5 fL (ref 80.0–100.0)
Platelets: 242 10*3/uL (ref 150–400)
RBC: 4.56 MIL/uL (ref 3.87–5.11)
RDW: 17.8 % — ABNORMAL HIGH (ref 11.5–15.5)
WBC: 8.3 10*3/uL (ref 4.0–10.5)
nRBC: 0 % (ref 0.0–0.2)

## 2019-10-30 LAB — URINALYSIS, ROUTINE W REFLEX MICROSCOPIC
Glucose, UA: NEGATIVE mg/dL
Ketones, ur: 15 mg/dL — AB
Leukocytes,Ua: NEGATIVE
Nitrite: NEGATIVE
Protein, ur: 100 mg/dL — AB
Specific Gravity, Urine: >1.03 — ABNORMAL HIGH (ref 1.005–1.030)
pH: 5 (ref 5.0–8.0)

## 2019-10-30 LAB — URINALYSIS, MICROSCOPIC (REFLEX): RBC / HPF: >50 RBC/hpf (ref 0–5)

## 2019-10-30 LAB — PREGNANCY, URINE: Preg Test, Ur: NEGATIVE

## 2019-10-30 LAB — LIPASE, BLOOD: Lipase: 30 U/L (ref 11–51)

## 2019-10-30 IMAGING — US US PELVIS COMPLETE WITH TRANSVAGINAL
3 series · 13 of 25 positions shown · non-contrast
Comparison: None

CLINICAL DATA: Vaginal bleeding. Left lower quadrant pain. History
of endometriosis.

EXAM:
TRANSABDOMINAL AND TRANSVAGINAL ULTRASOUND OF PELVIS
TECHNIQUE: Both transabdominal and transvaginal ultrasound examinations of the
pelvis were performed. Transabdominal technique was performed for
global imaging of the pelvis including uterus, ovaries, adnexal
regions, and pelvic cul-de-sac. It was necessary to proceed with
endovaginal exam following the transabdominal exam to visualize the
endometrium and ovaries.

[Series 1: us pelvic complete w transvaginal and torsion righ · 10 of 39 slices shown]
[im 1/39]
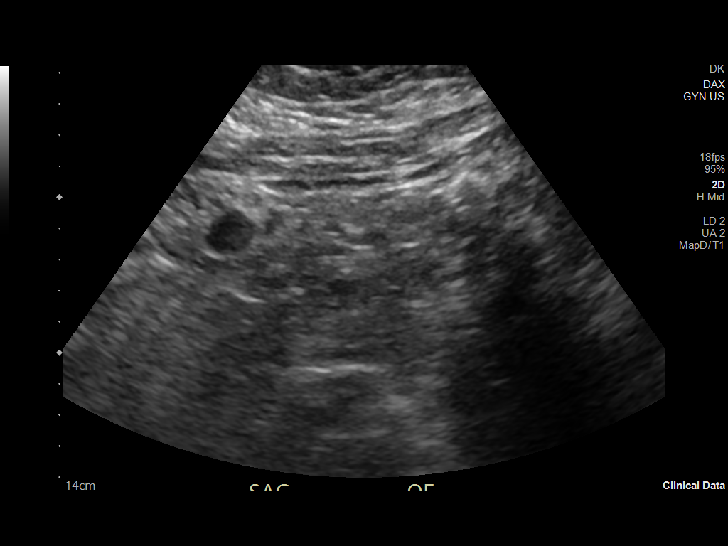
[im 5/39]
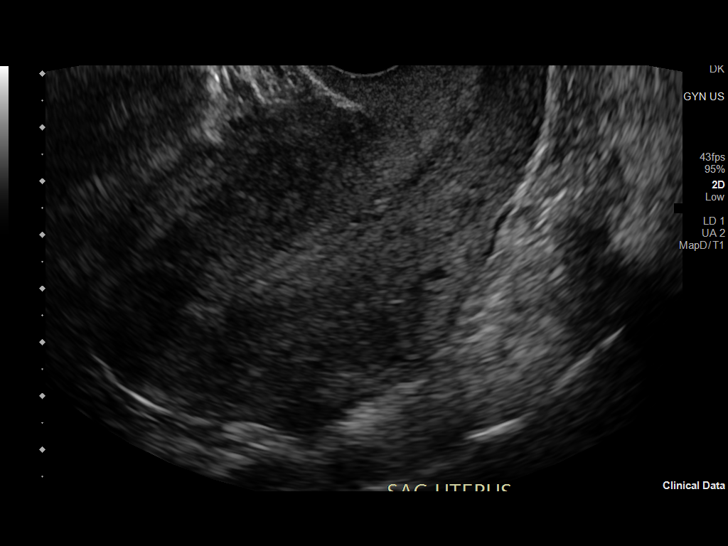
[im 9/39]
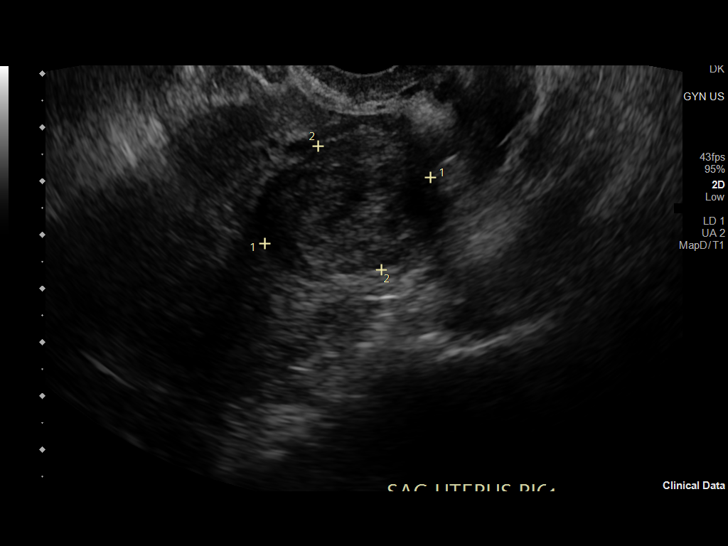
[im 13/39]
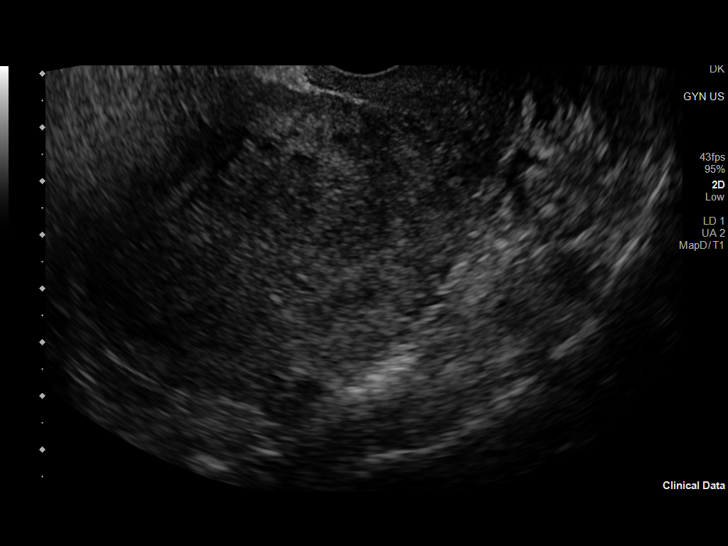
[im 17/39]
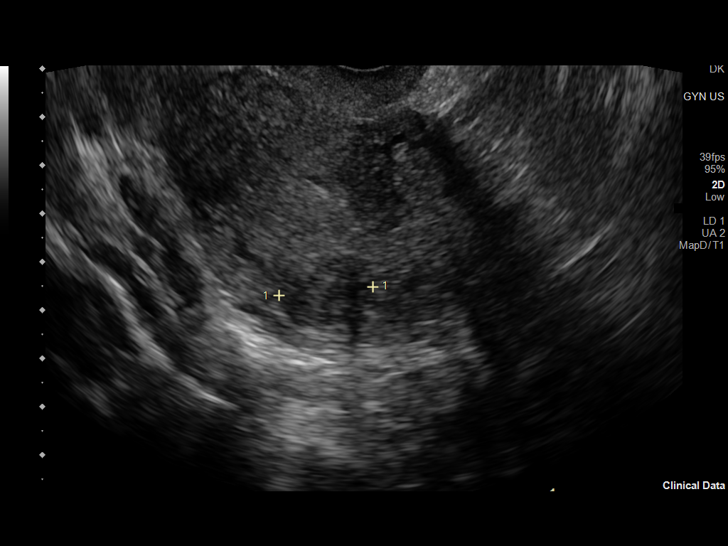
[im 22/39]
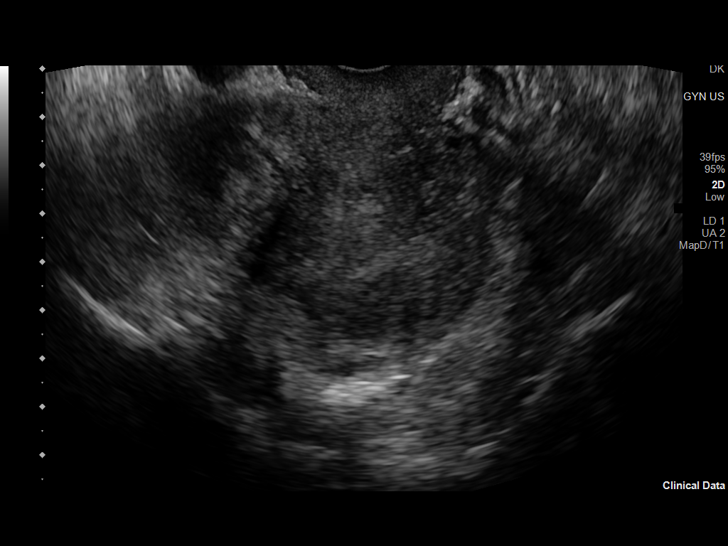
[im 26/39]
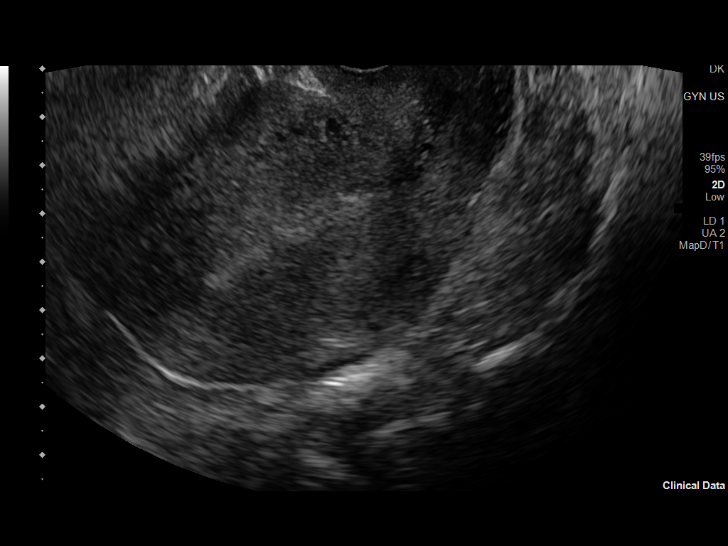
[im 30/39]
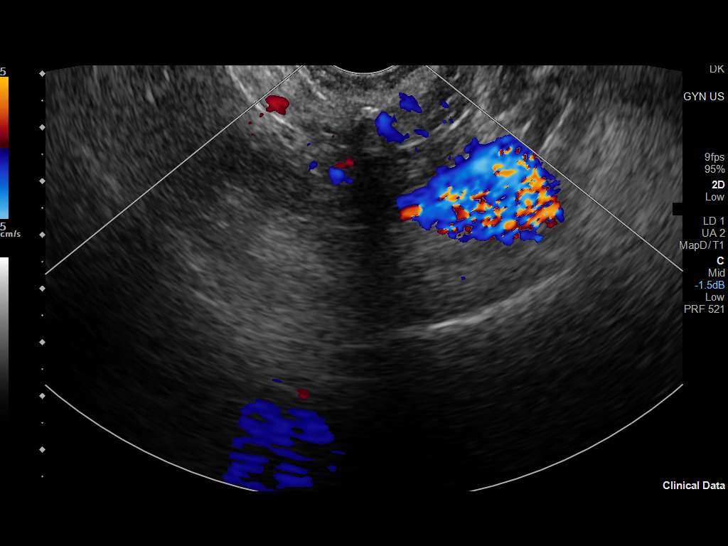
[im 34/39]
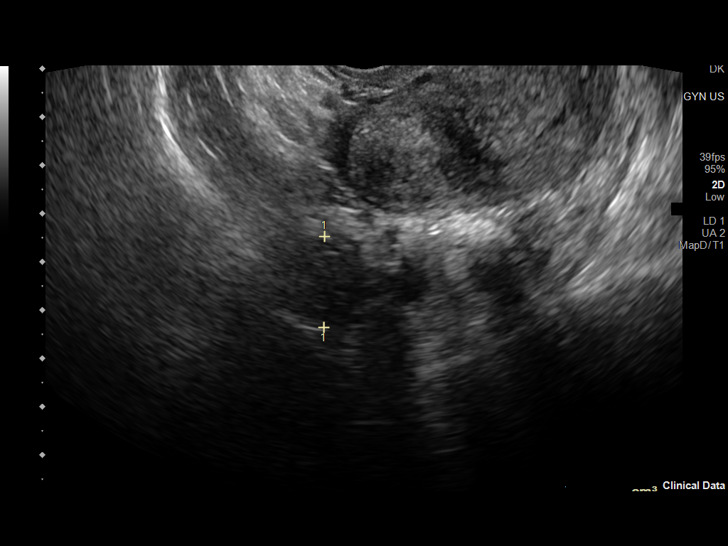
[im 39/39]
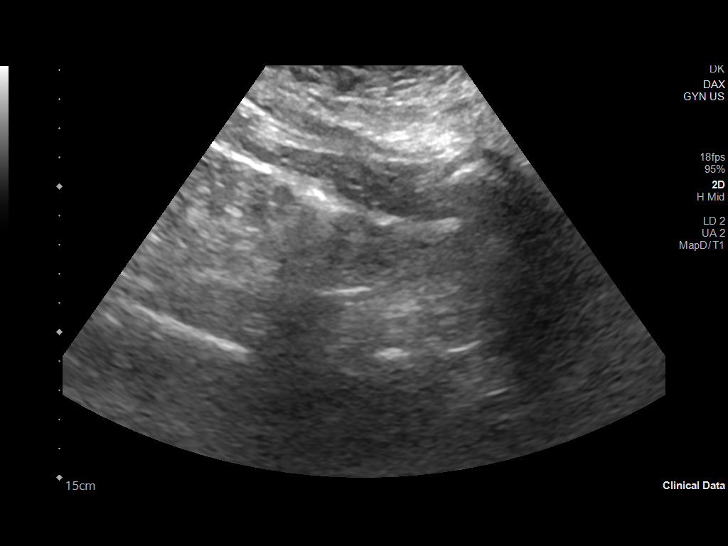

[Series 1001: gyn us · 1 of 7 slices shown (1 of 2)]
[im 4/7]
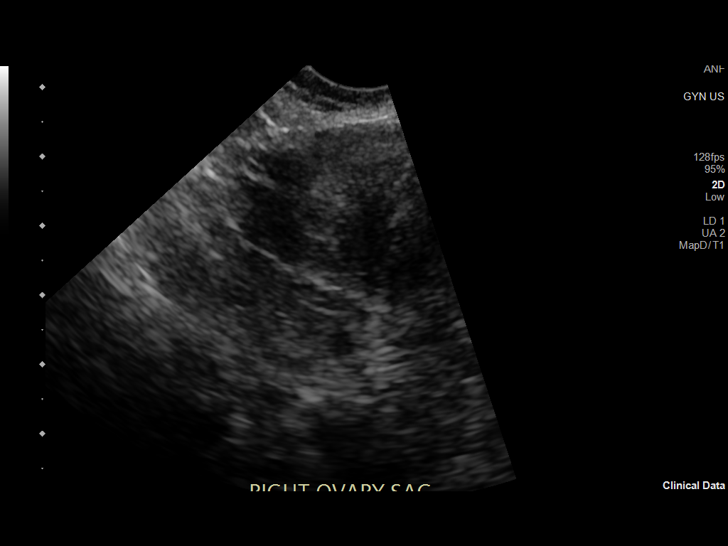

[Series 1002: gyn us · 2 of 7 slices shown (2 of 2)]
[im 1/7]
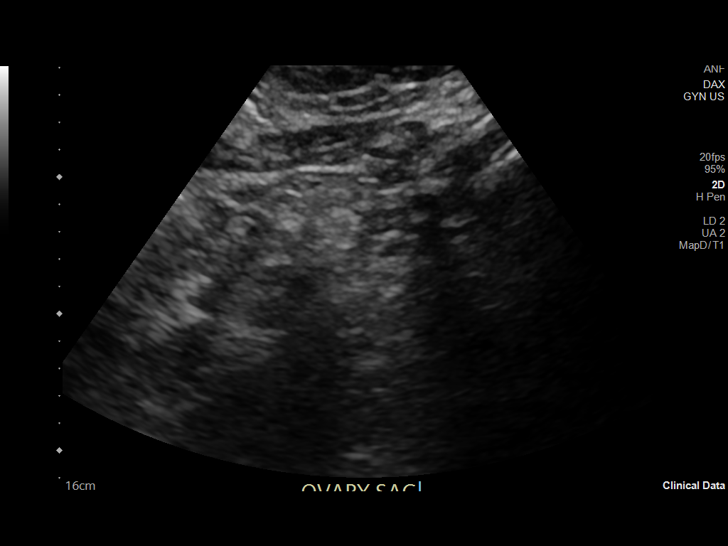
[im 7/7]
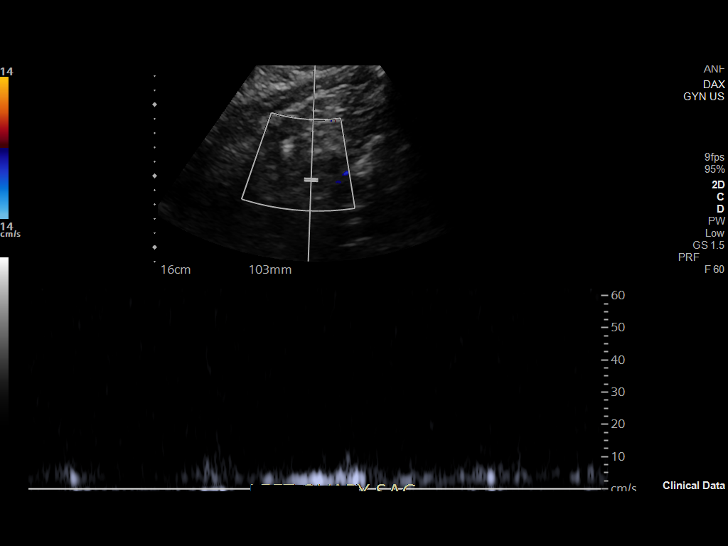

[13 of 25 positions shown; findings below may reference images not displayed]

FINDINGS: Uterus

Measurements: 10.3 x 5.3 x 7.5 cm = volume: 204.71 mL. Contains 3
fibroids. A fibroid to the right off the uterine body measures 3.3 x
2.6 x 2.3 cm. A posteriorly located fibroid measures 2 cm in all
dimensions. A small posterior fibroid off the fundus measures
cm.

Endometrium

Thickness: 5.3 mm.  No focal abnormality visualized.

Right ovary

Measurements: 1.9 x 1.9 x 1.5 cm = volume: 2.72 mL. Normal
appearance/no adnexal mass.

Left ovary

Measurements: 5.4 x 2.4 x 2.6 cm = volume: 17.65 mL. Normal
appearance/no adnexal mass.

Other findings

Trace fluid in the cul-de-sac.
IMPRESSION: 1. The endometrial stripe thickness is normal for a woman prior to
menopause. By report, the patient is not postmenopausal. If bleeding
remains unresponsive to hormonal or medical therapy, sonohysterogram
should be considered for focal lesion work-up. (Ref: Radiological
Reasoning: Algorithmic Workup of Abnormal Vaginal Bleeding with
Endovaginal Sonography and Sonohysterography. AJR [0S]; 191:S68-73)
2. Three fibroids in the uterus.
3. Limited visualization of the ovaries is unremarkable.
4. Trace fluid in the cul-de-sac is likely physiologic given the
premenopausal status.

## 2019-10-30 MED ORDER — HYDROCODONE-ACETAMINOPHEN 5-325 MG PO TABS
2.0000 | ORAL_TABLET | Freq: Once | ORAL | Status: AC
  Administered 2019-10-30: 2 via ORAL

## 2019-10-30 MED ORDER — HYDROCODONE-ACETAMINOPHEN 5-325 MG PO TABS
ORAL_TABLET | ORAL | Status: AC
  Filled 2019-10-30: qty 1

## 2019-10-30 MED ORDER — OXYCODONE HCL 5 MG PO TABS
5.0000 mg | ORAL_TABLET | Freq: Once | ORAL | Status: AC
  Administered 2019-10-30: 5 mg via ORAL
  Filled 2019-10-30: qty 1

## 2019-10-30 MED ORDER — HYDROCODONE-ACETAMINOPHEN 5-325 MG PO TABS
ORAL_TABLET | ORAL | Status: AC
  Filled 2019-10-30: qty 2

## 2019-10-30 MED ORDER — HYDROCODONE-ACETAMINOPHEN 5-325 MG PO TABS: 1.0000 | ORAL_TABLET | Freq: Once | ORAL | Status: DC

## 2019-10-30 MED ORDER — SODIUM CHLORIDE 0.9% FLUSH: 3.0000 mL | Freq: Once | INTRAVENOUS | Status: DC

## 2019-10-30 MED ORDER — HYDROCODONE-ACETAMINOPHEN 5-325 MG PO TABS: 1.0000 | ORAL_TABLET | Freq: Four times a day (QID) | ORAL | 0 refills | Status: AC | PRN

## 2019-10-30 MED ORDER — KETOROLAC TROMETHAMINE 60 MG/2ML IM SOLN
60.0000 mg | Freq: Once | INTRAMUSCULAR | Status: AC
  Administered 2019-10-30: 60 mg via INTRAMUSCULAR
  Filled 2019-10-30: qty 2

## 2019-10-30 NOTE — ED Triage Notes (Signed)
Patient c/o vaginal bleeding/ with heavy clots. Per patient has an appointment whit her  gyn next wednesday.

## 2019-10-30 NOTE — Discharge Instructions (Signed)
Please have your husband drive you to the ER now as you are in need of a CT scan of the abdomen/pelvis to rule out an acute abdomen, acute gynecologic process.

## 2019-10-30 NOTE — ED Notes (Signed)
Transported to [place].

## 2019-10-30 NOTE — ED Provider Notes (Signed)
[place]   MRN: [id number] DOB: 12/08/1965  Subjective:   [facility] is a 53 y.o. female presenting for 5 day hx of acute onset heavy vaginal bleeding. States that she started having her cycle Tuesday, had fatigue. Last night started having left lower quadrant, pelvic pain. She has a gynecologist, is getting a second opinion in 4 days. Has had regular cycles up to now. Patient is on norethindrone as prescribed from her gynecologist for help regulating her cycles and helping with menstrual clotting. Had actually had bleeding from her navel area and was supposed to use norethindrone for this. Has a hx of endometriosis. Has used IUD before but not now. Has not taken anything for pain. States it is the worst pain she has felt in this area, 10/10.    Current Facility-Administered Medications:  .  betamethasone acetate-betamethasone sodium phosphate (CELESTONE) injection 3 mg, 3 mg, Intramuscular, Once, Evans, Brent M, DPM .  betamethasone acetate-betamethasone sodium phosphate (CELESTONE) injection 3 mg, 3 mg, Intramuscular, Once, Edrick Kins, DPM  Current Outpatient Medications:  .  ALPRAZolam (XANAX) 0.5 MG tablet, Take 1 tablet (0.5 mg total) by mouth 3 (three) times daily as needed., Disp: 30 tablet, Rfl: 1 .  HYDROcodone-acetaminophen (NORCO/VICODIN) 5-325 MG tablet, Take 1 tablet by mouth every 6 (six) hours as needed for severe pain., Disp: 20 tablet, Rfl: 0 .  ipratropium (ATROVENT) 0.06 % nasal spray, Place 2 sprays into both nostrils 4 (four) times daily., Disp: 15 mL, Rfl: 1 .  b complex vitamins tablet, Take 1 tablet by mouth daily., Disp: 100 tablet, Rfl: 3 .  Cholecalciferol (VITAMIN D3) 1.25 MG (50000 UT) CAPS, Take 1 capsule by mouth once a week., Disp: 8 capsule, Rfl: 0 .  Cholecalciferol (VITAMIN D3) 50 MCG (2000 UT) capsule, Take 1 capsule (2,000 Units total) by mouth daily., Disp: 100 capsule, Rfl: 3 .  ciprofloxacin (CIPRO) 250 MG tablet, Take 1 tablet (250  mg total) by mouth 2 (two) times daily., Disp: 6 tablet, Rfl: 0 .  fluconazole (DIFLUCAN) 150 MG tablet, 1 by mouth every 3 days as needed, Disp: 2 tablet, Rfl: 1 .  folic acid (FOLVITE) 1 MG tablet, Take 1 mg by mouth daily., Disp: , Rfl: 5 .  ibuprofen (ADVIL,MOTRIN) 800 MG tablet, Take 1 tablet (800 mg total) by mouth 3 (three) times daily., Disp: 21 tablet, Rfl: 0 .  methylPREDNISolone (MEDROL DOSEPAK) 4 MG TBPK tablet, 6 day dose pack - take as directed, Disp: 21 tablet, Rfl: 0 .  pantoprazole (PROTONIX) 40 MG tablet, Take 1 tablet (40 mg total) by mouth daily., Disp: 90 tablet, Rfl: 3 .  triamcinolone cream (KENALOG) 0.5 %, Apply 1 application topically 3 (three) times daily as needed (for rash)., Disp: 60 g, Rfl: 1   Allergies  Allergen Reactions  . Methocarbamol     Itching   . Penicillins     Yeast infections Has patient had a PCN reaction causing immediate rash, facial/tongue/throat swelling, SOB or lightheadedness with hypotension: No Has patient had a PCN reaction causing severe rash involving mucus membranes or skin necrosis: No Has patient had a PCN reaction that required hospitalization No Has patient had a PCN reaction occurring within the last 10 years: No If all of the above answers are "NO", then may proceed with Cephalosporin use.   . Tramadol Other (See Comments)    Loopy and confused    Past Medical History:  Diagnosis Date  . Allergic rhinitis   .  Anemia   . Depression    treated with zoloft in past  . GERD (gastroesophageal reflux disease)   . History of UTI   . Hyperlipemia    no treatment required  . Lactose intolerance   . OA (osteoarthritis) of knee    bilaterally   . Obesity      Past Surgical History:  Procedure Laterality Date  . CHOLECYSTECTOMY N/A 09/12/2015   Procedure: LAPAROSCOPIC CHOLECYSTECTOMY WITH INTRAOPERATIVE CHOLANGIOGRAM;  Surgeon: Jackolyn Confer, MD;  Location: [facility];  Service: General;  Laterality: N/A;  . COLONOSCOPY WITH  PROPOFOL N/A 03/08/2016   Procedure: COLONOSCOPY WITH PROPOFOL;  Surgeon: Otis Brace, MD;  Location: [place];  Service: Gastroenterology;  Laterality: N/A;  . FOOT SURGERY      Family History  Problem Relation Age of Onset  . Cancer Father        throat  . Diabetes Other   . Cholelithiasis Other   . Breast cancer Maternal Aunt        ? age of onset    Social History   Tobacco Use  . Smoking status: Former Research scientist (life sciences)  . Smokeless tobacco: Never Used  . Tobacco comment: quit them "long time ago"  Substance Use Topics  . Alcohol use: No  . Drug use: No    ROS   Objective:   Vitals: BP 131/88 (BP Location: Left Arm)   Pulse 74   Temp 99.2 F (37.3 C) (Oral)   Resp 18   Ht 5\' 6"  (1.676 m)   Wt (!) 335 lb (152 kg)   SpO2 99%   BMI 54.07 kg/m   Physical Exam Constitutional:      General: She is in acute distress (from her abdominal pain).     Appearance: Normal appearance. She is well-developed. She is obese. She is not ill-appearing, toxic-appearing or diaphoretic.  HENT:     Head: Normocephalic and atraumatic.     Nose: Nose normal.     Mouth/Throat:     Mouth: Mucous membranes are moist.     Pharynx: Oropharynx is clear.  Eyes:     General: No scleral icterus.       Right eye: No discharge.        Left eye: No discharge.     Extraocular Movements: Extraocular movements intact.     Pupils: Pupils are equal, round, and reactive to light.  Cardiovascular:     Rate and Rhythm: Normal rate.  Pulmonary:     Effort: Pulmonary effort is normal.  Abdominal:     General: Bowel sounds are normal. There is no distension.     Palpations: Abdomen is soft. There is no mass.     Tenderness: There is abdominal tenderness (over area outlined under pannus) in the left lower quadrant. There is no right CVA tenderness, left CVA tenderness, guarding or rebound.     Hernia: No hernia is present.    Skin:    General: Skin is warm and dry.  Neurological:     General:  No focal deficit present.     Mental Status: She is alert and oriented to person, place, and time.  Psychiatric:        Mood and Affect: Mood normal.        Behavior: Behavior normal.        Thought Content: Thought content normal.        Judgment: Judgment normal.     Assessment and Plan :   PDMP not reviewed  this encounter.  1. Abdominal pain, left lower quadrant   2. Pelvic pain in female   3. Vaginal bleeding     Patient is in need of a higher level of work up than we can provide in the urgent care setting including a CT abdomen/pelvis. She was given PO hydrocodone at 10mg  for her severe pain. I advised that she go to the ER now with her husband driving her. She was agreeable to this.    Jaynee Eagles, PA-C 10/30/19 1353

## 2019-10-30 NOTE — ED Triage Notes (Signed)
C/o heavy vaginal bleeding with clots since Tuesday. Reports LLQ pain since yesterday.  Has appt with GYN next week.  Denies nausea, vomiting, and diarrhea.

## 2019-10-30 NOTE — Discharge Instructions (Addendum)
Take ibuprofen for pain. For severe/breakthrough pain, there is a prescription for narcotic pain medicine attached. Avoid driving or operating machinery while on narcotic pain medicine. Take only as needed. Keep your follow-up appointment on Wednesday. Return to the ED for worsening symptoms.   Your ultrasound results are as follows: 1. The endometrial stripe thickness is normal for a woman prior to menopause. By report, the patient is not postmenopausal. If bleeding remains unresponsive to hormonal or medical therapy, sonohysterogram should be considered for focal lesion work-up. (Ref: Radiological Reasoning: Algorithmic Workup of Abnormal Vaginal Bleeding with Endovaginal Sonography and Sonohysterography. AJR 2008; [id number]) 2. Three fibroids in the uterus. 3. Limited visualization of the ovaries is unremarkable. 4. Trace fluid in the cul-de-sac is likely physiologic given the premenopausal status.

## 2019-10-30 NOTE — ED Provider Notes (Signed)
[facility] Provider Note   CSN: [id number] Arrival date & time: 10/30/19  1404     History Chief Complaint  Patient presents with  . Vaginal Bleeding  . Abdominal Pain    Charlotte Meyer is a 54 y.o. female.  HPI Patient is a 54 year old female who presents for vaginal bleeding and left lower quadrant pain.  She reports that she still gets monthly periods.  About 6 months ago, she was started on progesterone for endometriosis.  At that time, she had bleeding from her navel.  She is seen by gynecology out of [facility] office.  She continues to take her hormone therapy.  Typical periods last about 4 days.  Her amount of bleeding is still large, but decreased from where it was 6 months ago.  Current period started on Tuesday.  It seemed like a normal menstrual cycle.  On Wednesday, she started passing large clots, which is abnormal for her.  Starting on this day, she also felt fatigued.  Yesterday, Friday, she had onset of severe left lower quadrant pain.  Pain has worsened today.  She continues to pass large clots today.  Due to this severe pain, she went to urgent care earlier today.  She was given 10 mg Percocet.  She reports that her pain went from 10/10 in severity to 8/10.  Dose of pain medicine was around 1 PM.  She reports that she does not take narcotic pain medication at home.  When she needs analgesia, she takes Tylenol.  She is currently not taking any other medications.  Other than the bleeding, normal pelvic pain, and new severe left lower quadrant pain, patient denies any other symptoms.  She has not felt lightheaded, dizzy, or near syncope.  She has not had any fevers or chills.  Last bowel movement was either yesterday or the day before and was reported normal.  She does not have pain with defecation.  She denies any urinary symptoms.    Past Medical History:  Diagnosis Date  . Allergic rhinitis   . Anemia   . Depression     treated with zoloft in past  . GERD (gastroesophageal reflux disease)   . History of UTI   . Hyperlipemia    no treatment required  . Lactose intolerance   . OA (osteoarthritis) of knee    bilaterally   . Obesity     Patient Active Problem List   Diagnosis Date Noted  . Acute upper back pain 06/13/2017  . Vaginitis 01/31/2017  . Acute low back pain without sciatica 01/31/2017  . Posterior neck pain 01/31/2017  . Right knee pain 01/31/2017  . Cramps, extremity 12/06/2016  . Piriformis syndrome 12/06/2016  . Cellulitis, umbilical [id number]  . Heat intolerance 10/31/2016  . Allergic rhinitis 10/31/2016  . Chronic pain of both knees 10/31/2016  . Symptomatic cholelithiasis 09/12/2015  . Cystitis 09/29/2014  . Vitamin D deficiency 09/29/2014  . Pain in joint, shoulder region 08/13/2013  . Chronic venous insufficiency 11/06/2012  . Stasis dermatitis of both legs 11/06/2012  . Lumbago 11/06/2012  . Vitamin B 12 deficiency 08/05/2012  . Fatigue 08/05/2012  . Well adult exam 01/30/2011  . Anemia 01/30/2011  . ABDOMINAL PAIN-RUQ 12/14/2009  . ABNORMAL EXAM-BILIARY TRACT 12/14/2009  . LACTOSE INTOLERANCE 10/31/2009  . Abdominal pain, epigastric 10/31/2009  . ELBOW PAIN 03/31/2009  . TOBACCO USE, QUIT 03/31/2009  . SWELLING MASS OR LUMP IN HEAD AND NECK 01/29/2008  . SINUSITIS-  ACUTE-NOS 01/08/2008  . KNEE PAIN 06/24/2007  . ANXIETY 03/11/2007  . PARESTHESIA 03/11/2007  . ALLERGIC RHINITIS 01/03/2007  . GERD 01/03/2007  . Dyslipidemia 06/25/2006  . Obesity 06/25/2006  . DEPRESSION 06/25/2006  . Osteoarthritis 06/25/2006    Past Surgical History:  Procedure Laterality Date  . CHOLECYSTECTOMY N/A 09/12/2015   Procedure: LAPAROSCOPIC CHOLECYSTECTOMY WITH INTRAOPERATIVE CHOLANGIOGRAM;  Surgeon: Jackolyn Confer, MD;  Location: [facility];  Service: General;  Laterality: N/A;  . COLONOSCOPY WITH PROPOFOL N/A 03/08/2016   Procedure: COLONOSCOPY WITH PROPOFOL;  Surgeon: Otis Brace, MD;  Location: [place];  Service: Gastroenterology;  Laterality: N/A;  . FOOT SURGERY       OB History    Gravida  1   Para      Term      Preterm      AB      Living        SAB      TAB      Ectopic      Multiple      Live Births              Family History  Problem Relation Age of Onset  . Cancer Father        throat  . Diabetes Other   . Cholelithiasis Other   . Breast cancer Maternal Aunt        ? age of onset    Social History   Tobacco Use  . Smoking status: Former Research scientist (life sciences)  . Smokeless tobacco: Never Used  . Tobacco comment: quit them "long time ago"  Substance Use Topics  . Alcohol use: No  . Drug use: No    Home Medications Prior to Admission medications   Medication Sig Start Date End Date Taking? Authorizing Provider  ALPRAZolam Duanne Moron) 0.5 MG tablet Take 1 tablet (0.5 mg total) by mouth 3 (three) times daily as needed. 01/30/19   Plotnikov, Evie Lacks, MD  b complex vitamins tablet Take 1 tablet by mouth daily. 11/16/18   Plotnikov, Evie Lacks, MD  Cholecalciferol (VITAMIN D3) 1.25 MG (50000 UT) CAPS Take 1 capsule by mouth once a week. 11/16/18   Plotnikov, Evie Lacks, MD  Cholecalciferol (VITAMIN D3) 50 MCG (2000 UT) capsule Take 1 capsule (2,000 Units total) by mouth daily. 11/16/18   Plotnikov, Evie Lacks, MD  ciprofloxacin (CIPRO) 250 MG tablet Take 1 tablet (250 mg total) by mouth 2 (two) times daily. 11/16/18   Plotnikov, Evie Lacks, MD  fluconazole (DIFLUCAN) 150 MG tablet 1 by mouth every 3 days as needed 11/16/18   Plotnikov, Evie Lacks, MD  folic acid (FOLVITE) 1 MG tablet Take 1 mg by mouth daily. 08/22/15   [provider]  HYDROcodone-acetaminophen (NORCO/VICODIN) 5-325 MG tablet Take 1 tablet by mouth every 6 (six) hours as needed for up to 3 days for severe pain. 10/30/19 11/02/19  Godfrey Pick, MD  ibuprofen (ADVIL,MOTRIN) 800 MG tablet Take 1 tablet (800 mg total) by mouth 3 (three) times daily. 12/21/17   Wieters,  Hallie C, PA-C  ipratropium (ATROVENT) 0.06 % nasal spray Place 2 sprays into both nostrils 4 (four) times daily. 02/24/16   Billy Fischer, MD  methylPREDNISolone (MEDROL DOSEPAK) 4 MG TBPK tablet 6 day dose pack - take as directed 04/14/19   Edrick Kins, DPM  pantoprazole (PROTONIX) 40 MG tablet Take 1 tablet (40 mg total) by mouth daily. 11/09/18   Plotnikov, Evie Lacks, MD  triamcinolone cream (KENALOG) 0.5 %  Apply 1 application topically 3 (three) times daily as needed (for rash). 12/06/16   Plotnikov, Evie Lacks, MD    Allergies    Methocarbamol, Penicillins, and Tramadol  Review of Systems   Review of Systems  Constitutional: Negative.  Negative for chills and fever.  HENT: Negative.  Negative for ear pain and sore throat.   Eyes: Negative.  Negative for pain and visual disturbance.  Respiratory: Negative.  Negative for cough and shortness of breath.   Cardiovascular: Negative.  Negative for chest pain and palpitations.  Gastrointestinal: Negative.  Negative for abdominal distention, abdominal pain, constipation, diarrhea, nausea and vomiting.  Endocrine: Negative.   Genitourinary: Positive for pelvic pain (mostly L adnexum) and vaginal bleeding. Negative for dysuria, enuresis, flank pain, frequency, genital sores, hematuria, urgency, vaginal discharge and vaginal pain.  Musculoskeletal: Negative.  Negative for arthralgias, back pain, myalgias and neck pain.  Skin: Negative.  Negative for color change and rash.  Allergic/Immunologic: Negative.   Neurological: Negative.  Negative for dizziness, seizures, syncope, weakness, light-headedness, numbness and headaches.  Hematological: Negative.  Does not bruise/bleed easily.  Psychiatric/Behavioral: Negative.   All other systems reviewed and are negative.   Physical Exam Updated Vital Signs BP (!) 144/76 (BP Location: Right Arm)   Pulse 87   Temp 98.4 F (36.9 C) (Oral)   Resp 18   Ht 5\' 6"  (1.676 m)   Wt (!) 152 kg   SpO2 100%    BMI 54.07 kg/m   Physical Exam Vitals and nursing note reviewed. Exam conducted with a chaperone present.  Constitutional:      General: She is not in acute distress.    Appearance: She is well-developed. She is obese. She is not ill-appearing, toxic-appearing or diaphoretic.  HENT:     Head: Normocephalic and atraumatic.     Mouth/Throat:     Mouth: Mucous membranes are moist.  Eyes:     Extraocular Movements: Extraocular movements intact.     Conjunctiva/sclera: Conjunctivae normal.  Cardiovascular:     Rate and Rhythm: Normal rate and regular rhythm.     Heart sounds: No murmur.  Pulmonary:     Effort: Pulmonary effort is normal. No respiratory distress.     Breath sounds: Normal breath sounds. No wheezing or rales.  Abdominal:     Palpations: Abdomen is soft.     Tenderness: There is abdominal tenderness in the left lower quadrant. There is no right CVA tenderness, left CVA tenderness, guarding or rebound.  Genitourinary:    Vagina: Normal. No signs of injury and foreign body. No bleeding.     Cervix: No cervical motion tenderness, discharge or friability.     Uterus: Normal.      Comments: Trace amount of dark blood in vaginal vault. No active bleeding from cervical os. No evidence of mass or infection.  Musculoskeletal:     Cervical back: Neck supple.  Skin:    General: Skin is warm and dry.  Neurological:     General: No focal deficit present.     Mental Status: She is alert and oriented to person, place, and time.     Cranial Nerves: No cranial nerve deficit.     Motor: No weakness.  Psychiatric:        Mood and Affect: Mood normal.        Behavior: Behavior normal.     ED Results / Procedures / Treatments   Labs (all labs ordered are listed, but only abnormal results are displayed) Labs  Reviewed  COMPREHENSIVE METABOLIC PANEL - Abnormal; Notable for the following components:      Result Value   CO2 19 (*)    Glucose, Bld 108 (*)    Calcium 8.6 (*)     Albumin 3.3 (*)    Total Bilirubin 1.4 (*)    All other components within normal limits  CBC - Abnormal; Notable for the following components:   RDW 17.8 (*)    All other components within normal limits  URINALYSIS, ROUTINE W REFLEX MICROSCOPIC - Abnormal; Notable for the following components:   Color, Urine AMBER (*)    APPearance HAZY (*)    Specific Gravity, Urine >1.030 (*)    Hgb urine dipstick LARGE (*)    Bilirubin Urine SMALL (*)    Ketones, ur 15 (*)    Protein, ur 100 (*)    All other components within normal limits  URINALYSIS, MICROSCOPIC (REFLEX) - Abnormal; Notable for the following components:   Bacteria, UA RARE (*)    All other components within normal limits  LIPASE, BLOOD  PREGNANCY, URINE    EKG None  Radiology US PELVIC COMPLETE WITH TRANSVAGINAL  Result Date: 10/30/2019 CLINICAL DATA:  Vaginal bleeding. Left lower quadrant pain. History of endometriosis. EXAM: TRANSABDOMINAL AND TRANSVAGINAL ULTRASOUND OF PELVIS TECHNIQUE: Both transabdominal and transvaginal ultrasound examinations of the pelvis were performed. Transabdominal technique was performed for global imaging of the pelvis including uterus, ovaries, adnexal regions, and pelvic cul-de-sac. It was necessary to proceed with endovaginal exam following the transabdominal exam to visualize the endometrium and ovaries. COMPARISON:  None FINDINGS: Uterus Measurements: 10.3 x 5.3 x 7.5 cm = volume: 204.71 mL. Contains 3 fibroids. A fibroid to the right off the uterine body measures 3.3 x 2.6 x 2.3 cm. A posteriorly located fibroid measures 2 cm in all dimensions. A small posterior fibroid off the fundus measures 1.3 cm. Endometrium Thickness: 5.3 mm.  No focal abnormality visualized. Right ovary Measurements: 1.9 x 1.9 x 1.5 cm = volume: 2.72 mL. Normal appearance/no adnexal mass. Left ovary Measurements: 5.4 x 2.4 x 2.6 cm = volume: 17.65 mL. Normal appearance/no adnexal mass. Other findings Trace fluid in the  cul-de-sac. IMPRESSION: 1. The endometrial stripe thickness is normal for a woman prior to menopause. By report, the patient is not postmenopausal. If bleeding remains unresponsive to hormonal or medical therapy, sonohysterogram should be considered for focal lesion work-up. (Ref: Radiological Reasoning: Algorithmic Workup of Abnormal Vaginal Bleeding with Endovaginal Sonography and Sonohysterography. AJR 2008; [id number]) 2. Three fibroids in the uterus. 3. Limited visualization of the ovaries is unremarkable. 4. Trace fluid in the cul-de-sac is likely physiologic given the premenopausal status. Electronically Signed   By: Dorise Bullion III M.D   On: 10/30/2019 17:21    Procedures Procedures (including critical care time)  Medications Ordered in ED Medications  oxyCODONE (Oxy IR/ROXICODONE) immediate release tablet 5 mg (5 mg Oral Given 10/30/19 1757)  ketorolac (TORADOL) injection 60 mg (60 mg Intramuscular Given 10/30/19 1811)    ED Course  I have reviewed the triage vital signs and the nursing notes.  Pertinent labs & imaging results that were available during my care of the patient were reviewed by me and considered in my medical decision making (see chart for details).    MDM Rules/Calculators/A&P                      Patient is a 54 year old female who is premenopausal and has history of endometriosis,  currently on Norethindrone, who presents for vaginal bleeding and left-sided pelvic pain.  She is currently on day 4 of her menstrual period.  She states that periods typically last for 4 days.  This time, she has concerns of increased clot passage and the onset of severe left-sided pelvic pain starting yesterday.  She was seen at urgent care earlier today, where she got 10 mg of oxycodone.  Upon arrival in the ED, she reports continued pain, 8/10 in severity.  Vital signs are unremarkable.  She is well-appearing and speaking on her cell phone.  Exam is notable for tenderness to the left  adnexal area, without guarding or rebound.  Other areas of the abdomen are without tenderness.  5 mg Roxicodone was ordered for analgesia.  Labs are notable for absence of leukocytosis, hemoglobin of 12.9 (increased from baseline), absence of urine hCG, absence of urine infection.  Pelvic ultrasound was ordered for assessment of possible ovarian torsion or other adnexal abnormalities.  Results of ultrasound were negative for ovarian abnormality.  There was trace fluid in cul-de-sac that was deemed likely physiologic given premenopausal status.  Endometrial stripe appeared normal.  3 uterine fibroids were identified.  Patient returned to the ED and underwent bedside pelvic exam.  On pelvic exam, patient had trace amount of dark blood in the vaginal vault.  There were no masses or lesions identified.  There was no areas of friability.  Cervical os appeared closed with no active bleeding.  Given her normal WBC, and normal bowel movements, do not suspect diverticulitis or hernia.  The timing of the left pelvic pain starting on day 3 of her menstrual cycle suggests gynecologic etiology.  Patient has history of endometriosis which could cause pain in a multitude of areas.  This pain should resolve with the end of her cycle.  Decision about possible changes in hormonal medications to be discussed at her gynecology appointment next week.  Toradol was ordered for continued analgesia.  Patient is to follow-up on her previously scheduled appointment for next Wednesday (4 days from now) with her gynecology doctor.  She was advised to take ibuprofen for pain control.  Short course of Norco's was prescribed for breakthrough pain.  She was discharged in stable condition.  Final Clinical Impression(s) / ED Diagnoses Final diagnoses:  Vaginal bleeding  Pelvic pain    Rx / DC Orders ED Discharge Orders         Ordered    HYDROcodone-acetaminophen (NORCO/VICODIN) 5-325 MG tablet  Every 6 hours PRN     10/30/19  1758           Godfrey Pick, MD 10/31/19 [id number]    Malvin Johns, MD 11/03/19 1246

## 2019-11-03 ENCOUNTER — Other Ambulatory Visit: Payer: Self-pay | Admitting: Obstetrics and Gynecology

## 2019-11-10 ENCOUNTER — Other Ambulatory Visit: Payer: Self-pay | Admitting: Obstetrics and Gynecology

## 2019-11-23 ENCOUNTER — Other Ambulatory Visit: Payer: Self-pay | Admitting: Obstetrics and Gynecology

## 2019-11-23 DIAGNOSIS — D251 Intramural leiomyoma of uterus: Secondary | ICD-10-CM

## 2019-12-01 ENCOUNTER — Other Ambulatory Visit: Payer: Self-pay | Admitting: Family Medicine

## 2019-12-01 DIAGNOSIS — E2839 Other primary ovarian failure: Secondary | ICD-10-CM

## 2019-12-28 ENCOUNTER — Other Ambulatory Visit: Payer: Self-pay

## 2019-12-28 ENCOUNTER — Ambulatory Visit
Admission: RE | Admit: 2019-12-28 | Discharge: 2019-12-28 | Disposition: A | Discharge status: Home / Self Care | Payer: Federal, State, Local not specified - PPO | Source: Ambulatory Visit | Attending: Obstetrics and Gynecology | Admitting: Obstetrics and Gynecology

## 2019-12-28 DIAGNOSIS — D251 Intramural leiomyoma of uterus: Secondary | ICD-10-CM

## 2019-12-28 IMAGING — MR MR PELVIS WO/W CM
8 of 9 series · 35 of 48 positions shown · IV contrast (multihance)
Comparison: Pelvic ultrasound, [DATE]

CLINICAL DATA: Uterine fibroids

EXAM:
MRI PELVIS WITHOUT AND WITH CONTRAST
TECHNIQUE: Multiplanar multisequence MR imaging of the pelvis was performed
both before and after administration of intravenous contrast.
CONTRAST:  20mL MULTIHANCE GADOBENATE DIMEGLUMINE 529 MG/ML IV SOLN

[Series 2: T2 · coronal · 5.0mm · 1.25mm/px · 2 of 30 slices shown (1 of 3)]
[im 1/30]
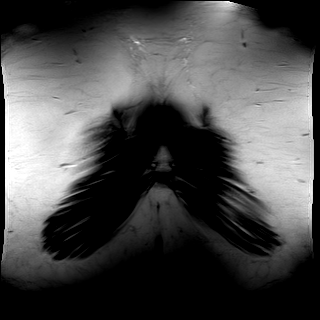
[im 30/30]
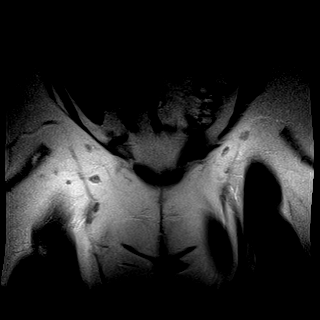

[Series 3: T2 · sagittal · 5.0mm · 0.78mm/px · 2 of 27 slices shown (2 of 3)]
[im 1/27]
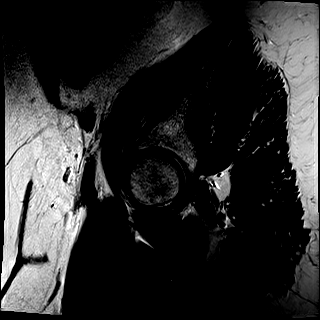
[im 27/27]
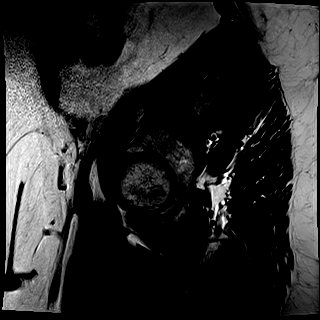

[Series 4: T2 · axial · 5.0mm · 0.41mm/px · z∈[-113,+109]mm · 2 of 38 slices shown (3 of 3)]
[im 1/38]
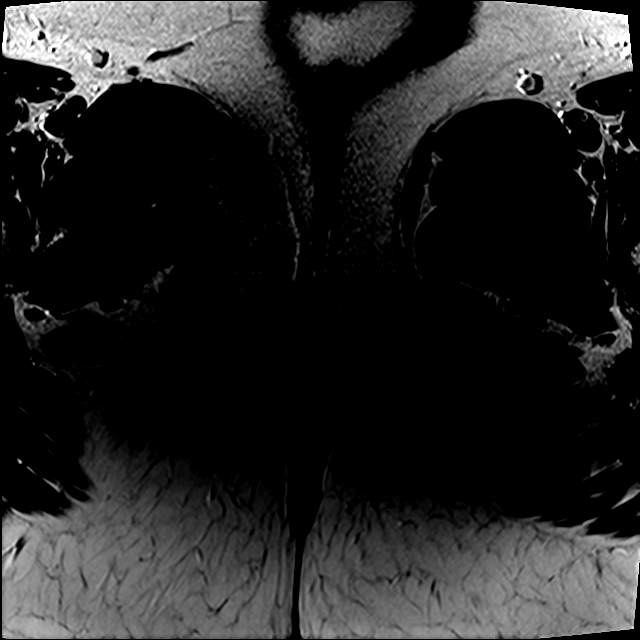
[im 38/38]
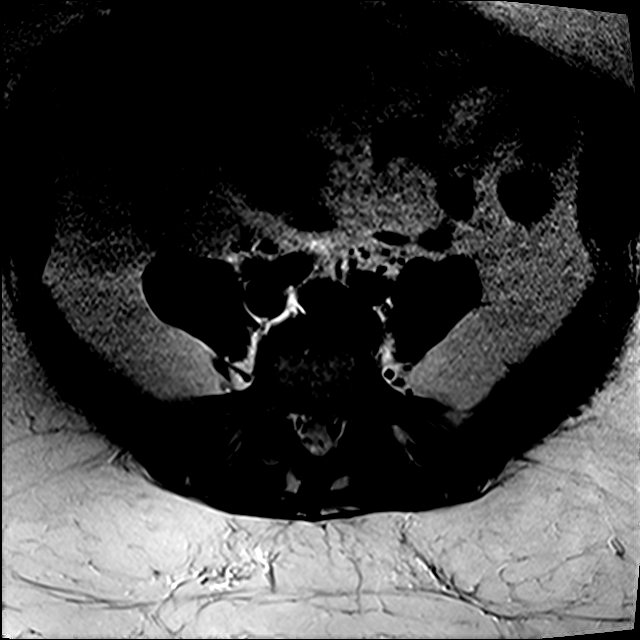

[Series 5: T2 fat-sat · axial · 5.0mm · 0.41mm/px · z∈[-113,+109]mm · 2 of 38 slices shown]
[im 1/38]
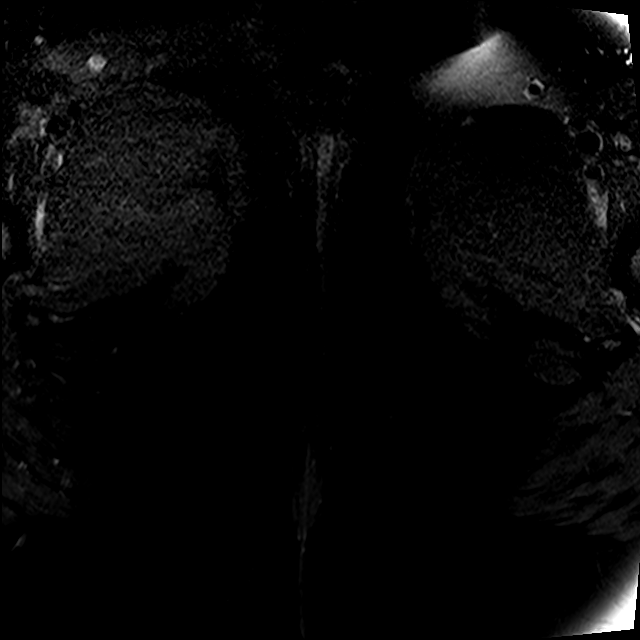
[im 38/38]
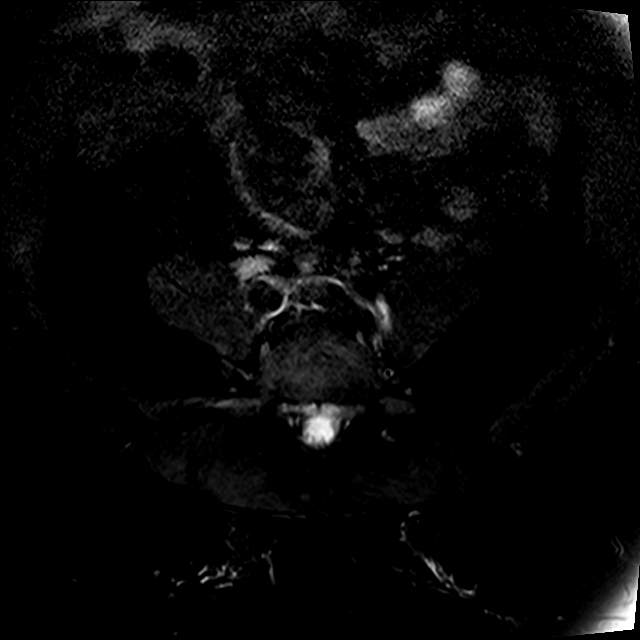

[Series 6: T1 · axial · 1.2mm · 0.78mm/px · z∈[-107,+103]mm · 8 of 176 slices shown]
[im 1/176]
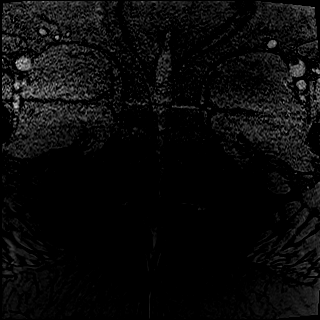
[im 20/176]
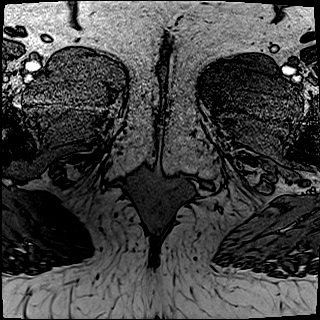
[im 59/176]
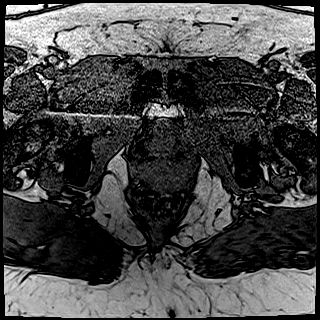
[im 78/176]
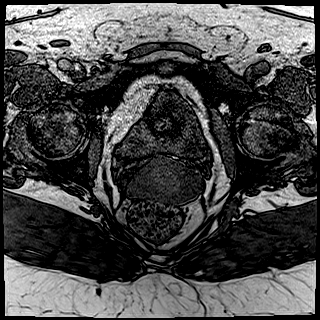
[im 98/176]
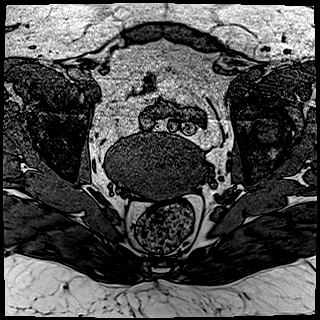
[im 117/176]
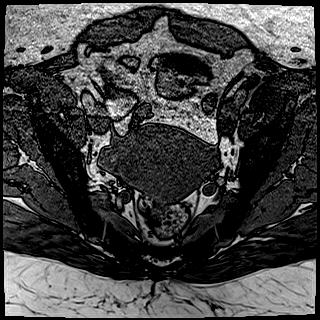
[im 156/176]
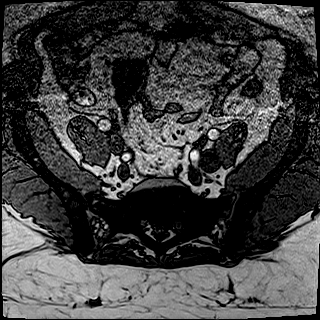
[im 176/176]
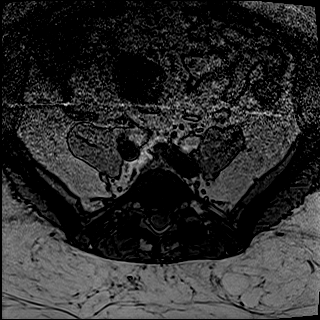

[Series 7: T1 fat-sat · axial · 1.2mm · 0.78mm/px · z∈[-107,+103]mm · 8 of 176 slices shown (1 of 2)]
[im 1/176]
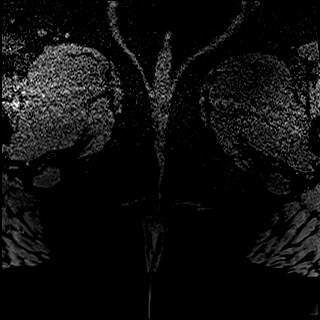
[im 20/176]
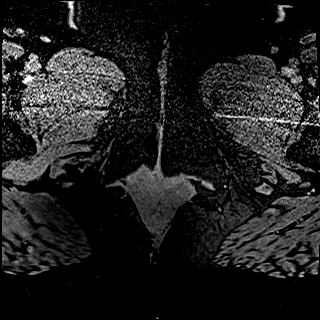
[im 59/176]
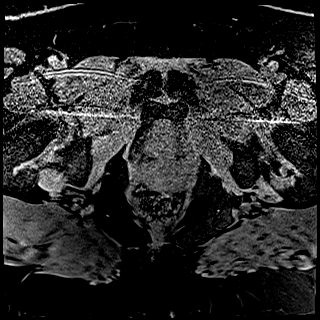
[im 78/176]
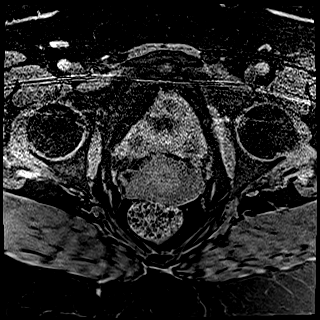
[im 98/176]
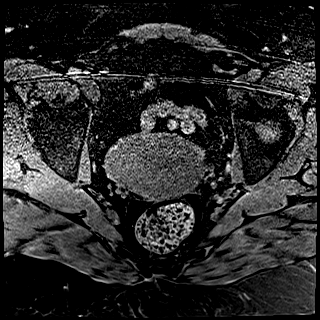
[im 117/176]
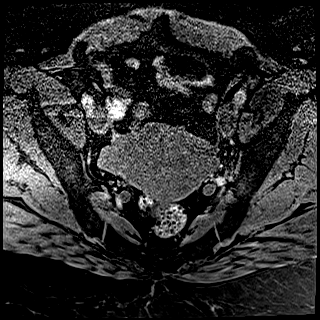
[im 156/176]
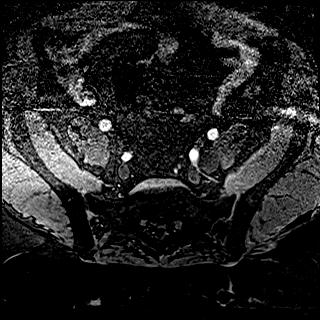
[im 176/176]
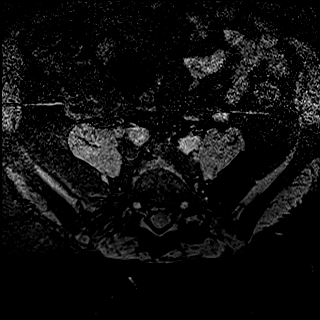

[Series 9: T1 fat-sat post-contrast · axial · 1.2mm · 0.78mm/px · z∈[-107,+103]mm · 8 of 176 slices shown]
[im 1/176]
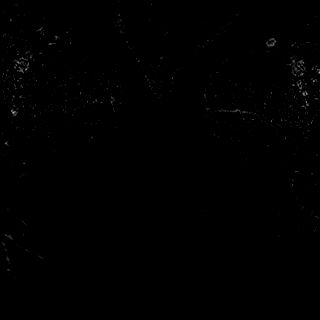
[im 20/176]
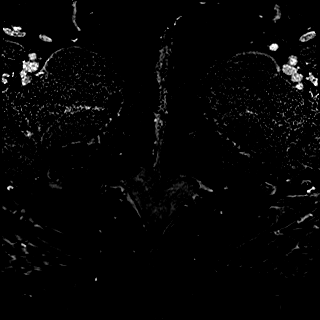
[im 59/176]
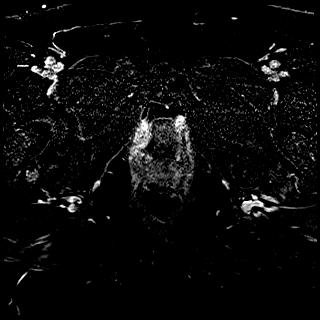
[im 78/176]
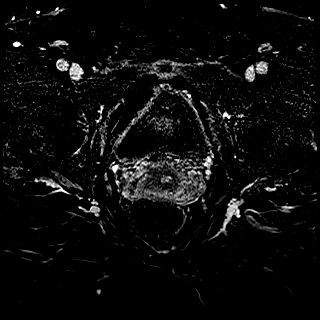
[im 98/176]
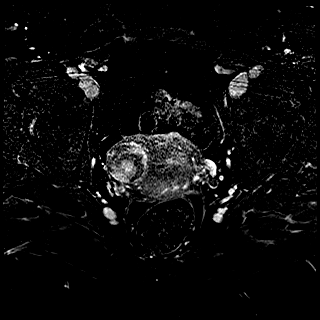
[im 117/176]
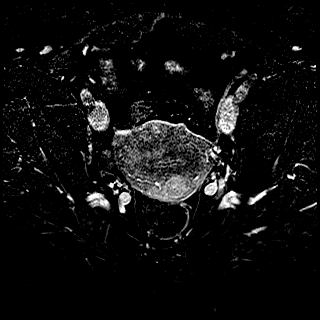
[im 156/176]
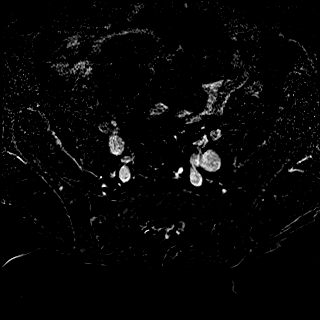
[im 176/176]
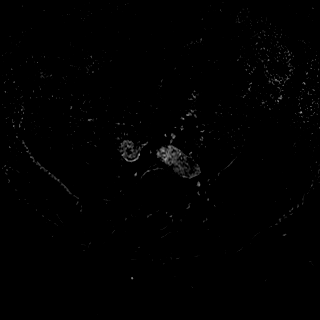

[Series 10: T1 fat-sat · coronal · 1.2mm · 0.78mm/px · 3 of 144 slices shown (2 of 2)]
[im 1/144]
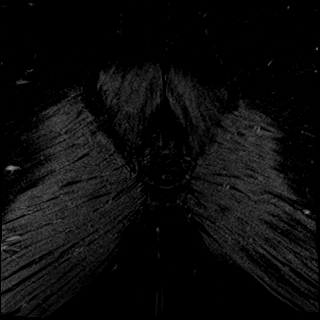
[im 21/144]
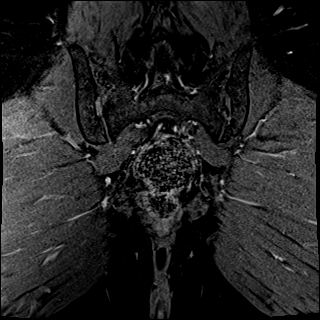
[im 41/144]
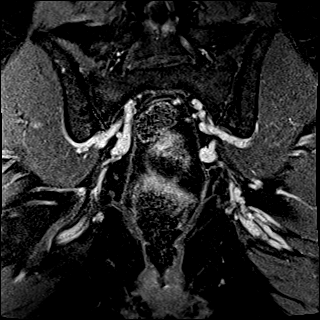

[35 of 48 positions shown; findings below may reference images not displayed]

FINDINGS: Urinary Tract:  No abnormality visualized.

Bowel:  Unremarkable visualized pelvic bowel loops.

Vascular/Lymphatic: No pathologically enlarged lymph nodes. No
significant vascular abnormality seen.

Reproductive: Normal size of the uterus. There are several
intramural and subserosal fibroids, the largest a subserosal fibroid
in the right aspect of the uterine body measuring 3.1 x 3.1 cm, a
second intramural or subserosal fibroid of the posterior uterine
body measuring 1.7 x 1.7 cm, and multiple additional much smaller
intramural fibroids, which are generally appreciated only by
contrast enhancement due to small size. The endometrial lining is
approximately 1.0 cm in thickness.

Other:  None.

Musculoskeletal: No suspicious bone lesions identified.
IMPRESSION: 1. Normal size of the uterus. There are several intramural and
subserosal fibroids, the largest a subserosal fibroid in the right
aspect of the uterine body measuring 3.1 cm, with multiple
additional smaller fibroids.

2. No significant submucosal fibroids and no distortion of the
endometrial cavity.

3. The endometrial lining is approximately 1.0 cm in thickness,
which is normal in the premenopausal setting but abnormally
thickened in the postmenopausal setting. Correlate with menopausal
status.

## 2019-12-28 MED ORDER — GADOBENATE DIMEGLUMINE 529 MG/ML IV SOLN
20.0000 mL | Freq: Once | INTRAVENOUS | Status: AC | PRN
  Administered 2019-12-28: 20 mL via INTRAVENOUS

## 2020-03-07 ENCOUNTER — Ambulatory Visit
Admission: RE | Admit: 2020-03-07 | Discharge: 2020-03-07 | Disposition: A | Discharge status: Home / Self Care | Payer: Federal, State, Local not specified - PPO | Source: Ambulatory Visit | Attending: Family Medicine | Admitting: Family Medicine

## 2020-03-07 ENCOUNTER — Other Ambulatory Visit: Payer: Self-pay

## 2020-03-07 DIAGNOSIS — E2839 Other primary ovarian failure: Secondary | ICD-10-CM

## 2020-08-10 ENCOUNTER — Other Ambulatory Visit (HOSPITAL_COMMUNITY): Payer: Self-pay | Admitting: Surgery

## 2020-08-10 ENCOUNTER — Other Ambulatory Visit: Payer: Self-pay | Admitting: Surgery

## 2020-08-15 ENCOUNTER — Other Ambulatory Visit: Payer: Self-pay

## 2020-08-15 ENCOUNTER — Ambulatory Visit (HOSPITAL_COMMUNITY)
Admission: RE | Admit: 2020-08-15 | Discharge: 2020-08-15 | Disposition: A | Discharge status: Home / Self Care | Payer: Federal, State, Local not specified - PPO | Source: Ambulatory Visit | Attending: Surgery | Admitting: Surgery

## 2020-08-15 DIAGNOSIS — Z6841 Body Mass Index (BMI) 40.0 and over, adult: Secondary | ICD-10-CM | POA: Insufficient documentation

## 2020-08-15 IMAGING — DX DG CHEST 2V
2 series · 2 of 2 positions shown · non-contrast
Comparison: [DATE]

CLINICAL DATA: Morbid obesity.  Pre bariatric surgery testing.

EXAM:
CHEST - 2 VIEW

[chest pa]
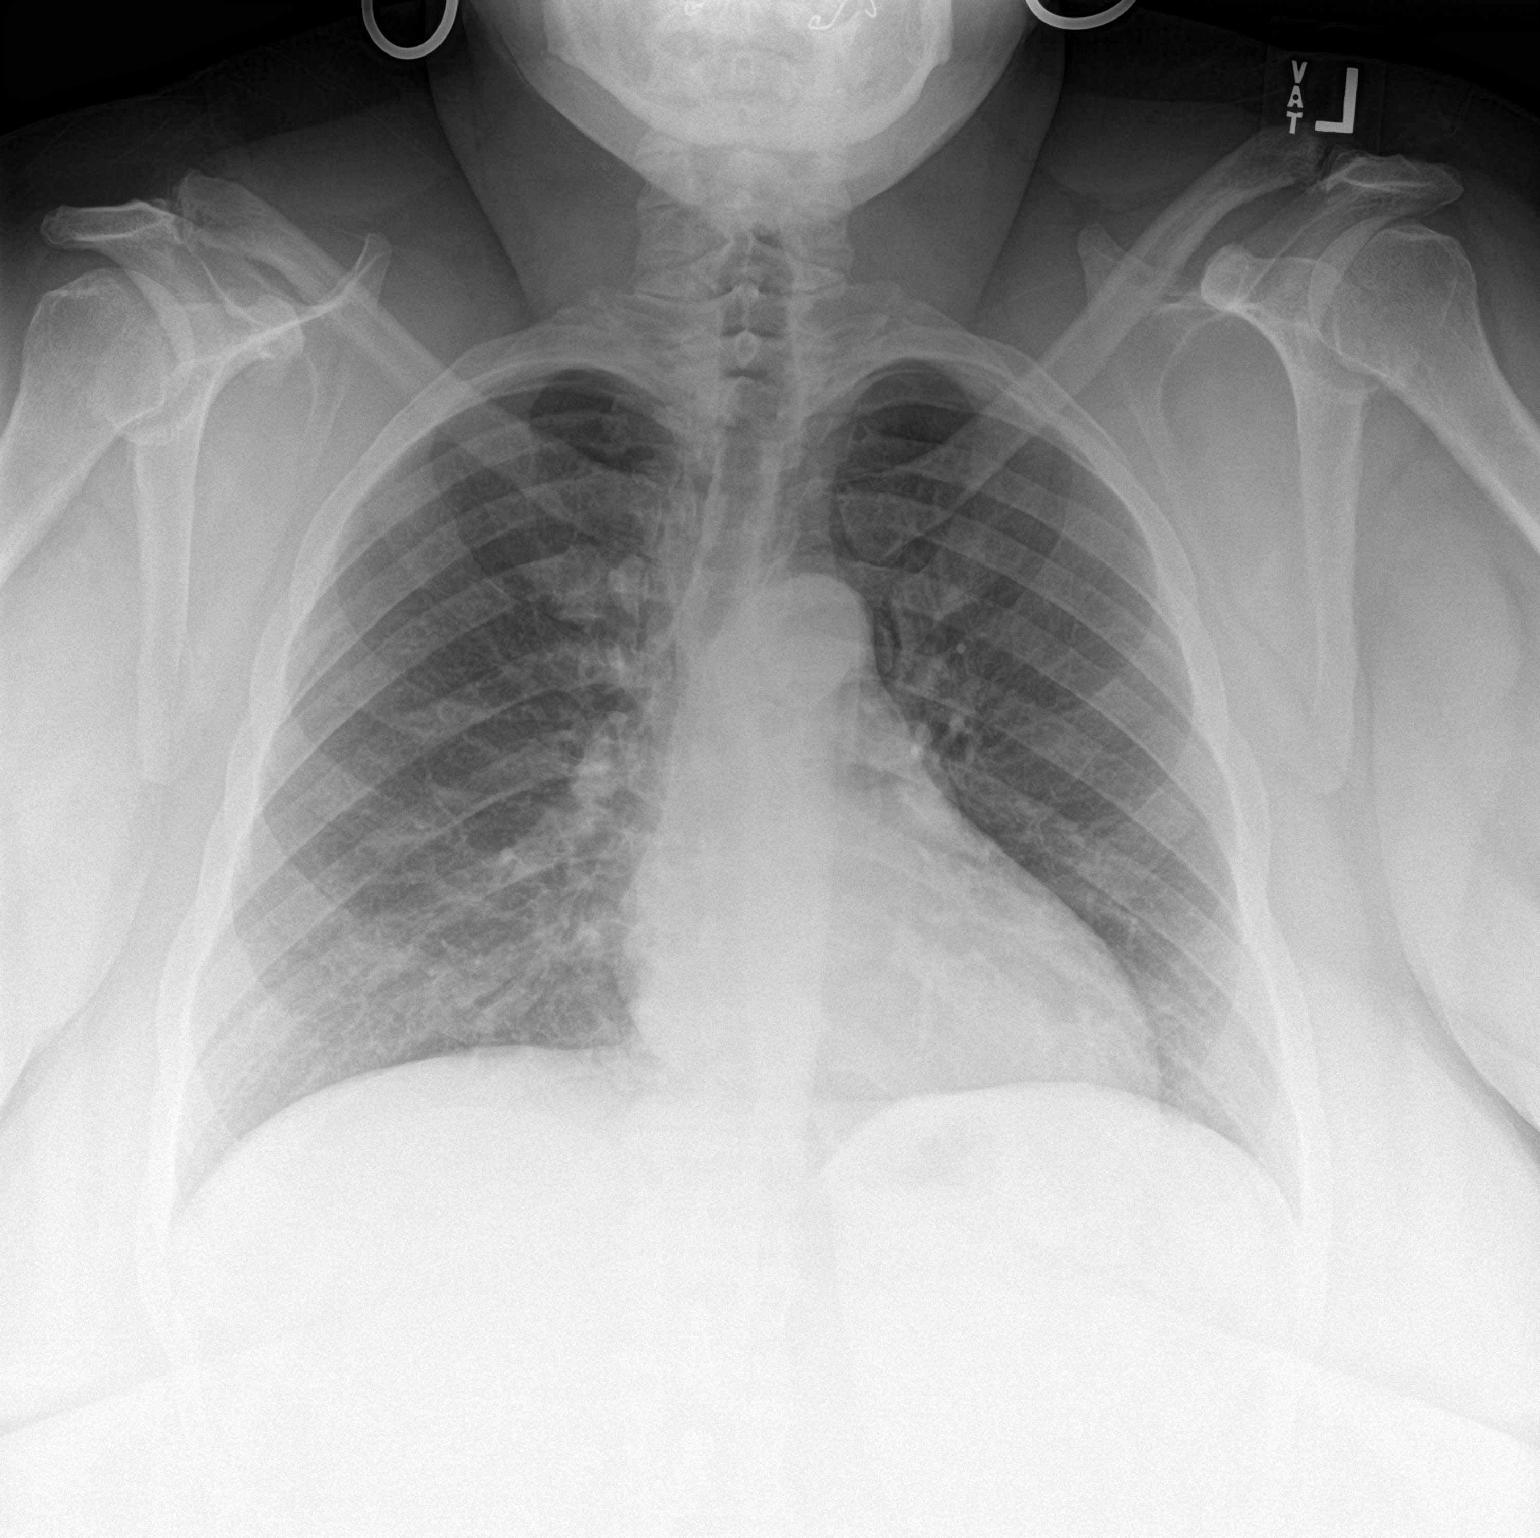

[chest lat]
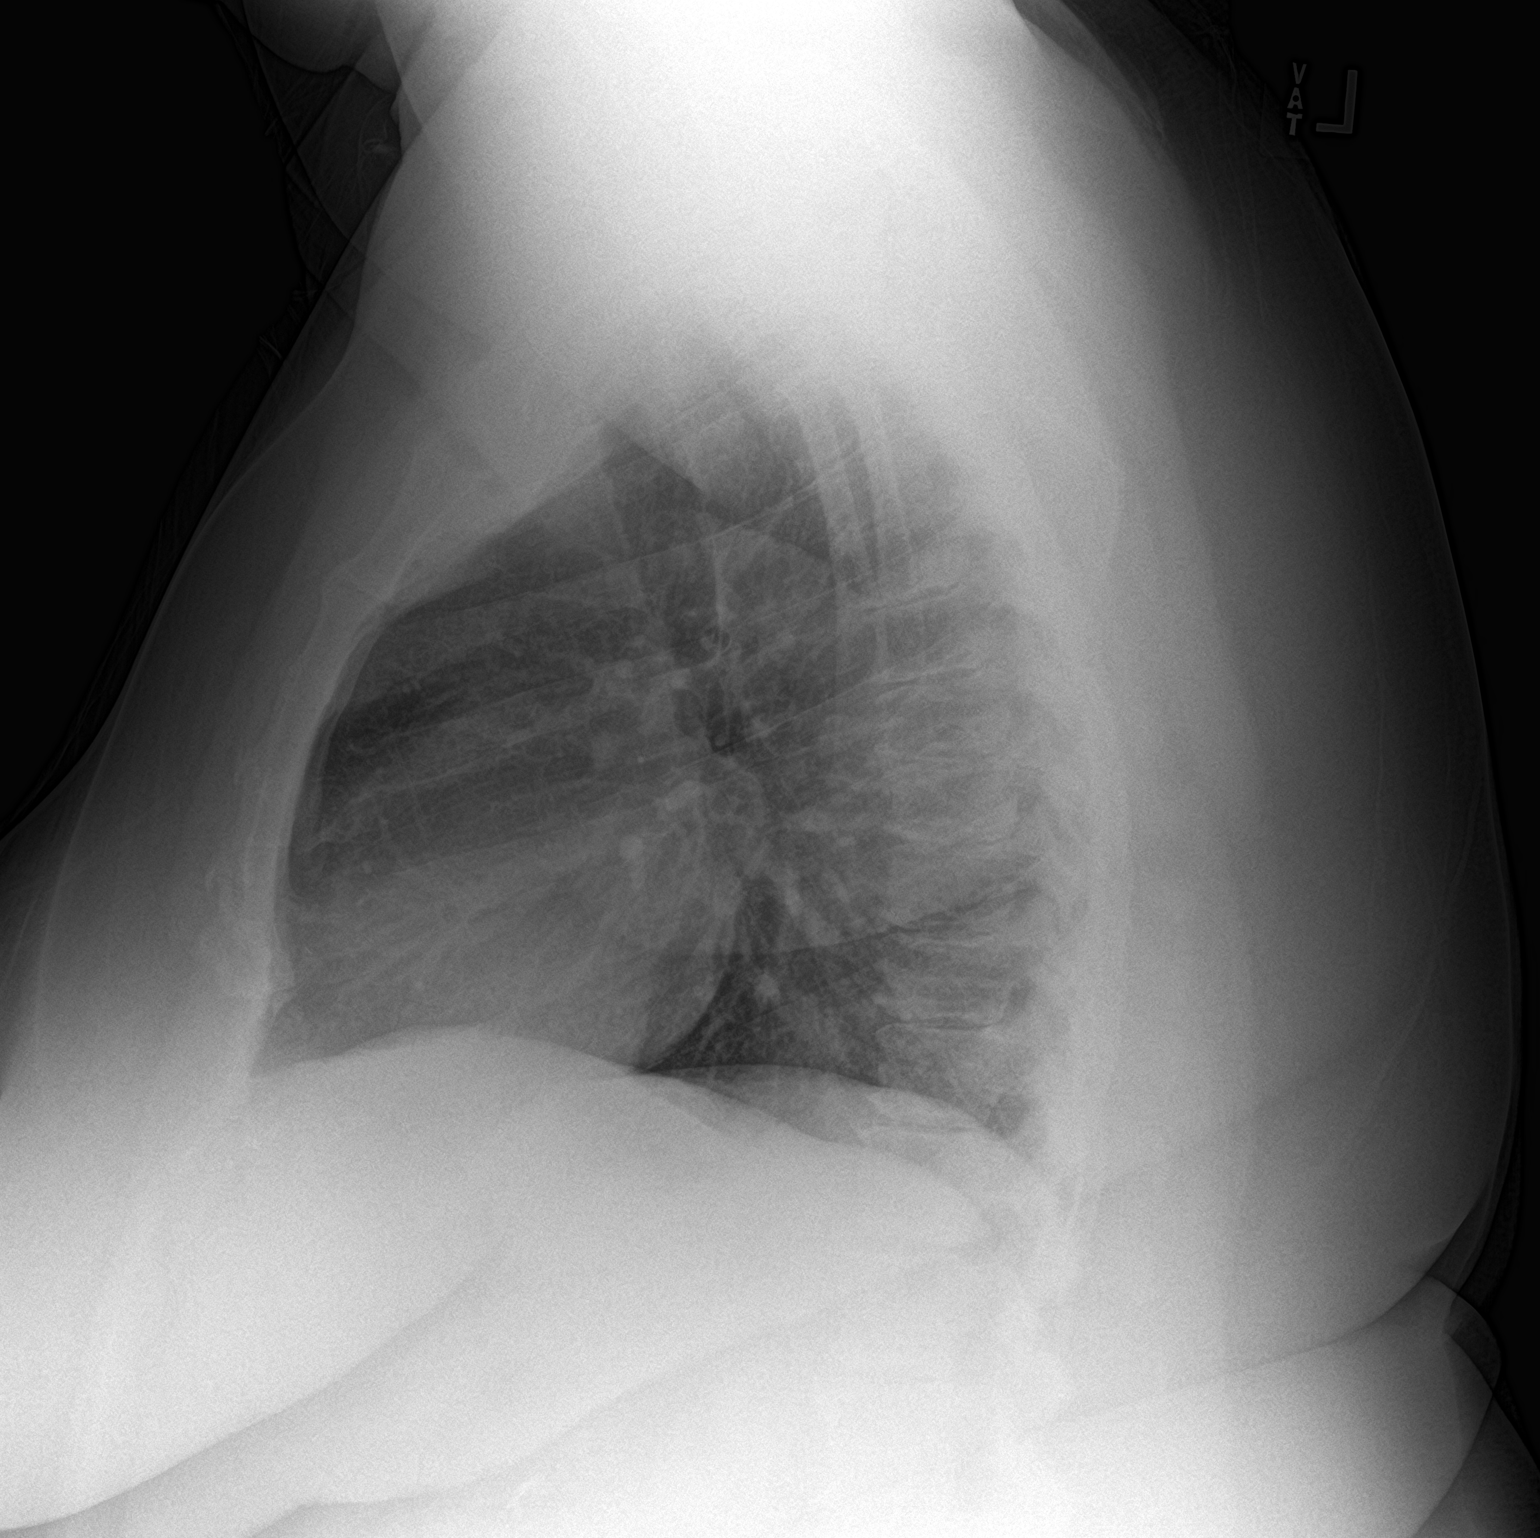

[2 of 2 positions shown; findings below may reference images not displayed]

FINDINGS: The cardiomediastinal contours are normal. The lungs are clear.
Pulmonary vasculature is normal. No consolidation, pleural effusion,
or pneumothorax. Mild midthoracic spondylosis. No acute osseous
abnormalities are seen.
IMPRESSION: No acute chest findings.

## 2020-08-16 ENCOUNTER — Other Ambulatory Visit (HOSPITAL_COMMUNITY): Payer: Self-pay | Admitting: Surgery

## 2020-08-29 ENCOUNTER — Other Ambulatory Visit: Payer: Self-pay

## 2020-08-29 ENCOUNTER — Ambulatory Visit (HOSPITAL_COMMUNITY)
Admission: RE | Admit: 2020-08-29 | Discharge: 2020-08-29 | Disposition: A | Discharge status: Home / Self Care | Payer: Federal, State, Local not specified - PPO | Source: Ambulatory Visit | Attending: Surgery | Admitting: Surgery

## 2020-08-29 DIAGNOSIS — Z6841 Body Mass Index (BMI) 40.0 and over, adult: Secondary | ICD-10-CM | POA: Insufficient documentation

## 2020-08-29 DIAGNOSIS — I498 Other specified cardiac arrhythmias: Secondary | ICD-10-CM | POA: Diagnosis not present

## 2020-09-12 ENCOUNTER — Ambulatory Visit: Payer: Federal, State, Local not specified - PPO | Admitting: Skilled Nursing Facility1

## 2020-09-26 ENCOUNTER — Telehealth: Payer: Self-pay

## 2020-09-26 ENCOUNTER — Encounter: Payer: Self-pay | Admitting: Neurology

## 2020-09-26 ENCOUNTER — Institutional Professional Consult (permissible substitution): Payer: Federal, State, Local not specified - PPO | Admitting: Neurology

## 2020-09-26 NOTE — Telephone Encounter (Signed)
Pt no showed 09/26/20 appt. It was mentioned the pt might not come due to 40 $ copay.. but pt was not removed from the schedule.

## 2020-10-16 ENCOUNTER — Other Ambulatory Visit: Payer: Self-pay

## 2020-10-16 ENCOUNTER — Other Ambulatory Visit: Payer: Self-pay | Admitting: Obstetrics and Gynecology

## 2020-10-16 ENCOUNTER — Ambulatory Visit: Payer: Federal, State, Local not specified - PPO | Admitting: Skilled Nursing Facility1

## 2020-10-16 ENCOUNTER — Ambulatory Visit
Admission: RE | Admit: 2020-10-16 | Discharge: 2020-10-16 | Disposition: A | Discharge status: Home / Self Care | Payer: Federal, State, Local not specified - PPO | Source: Ambulatory Visit | Attending: Obstetrics and Gynecology | Admitting: Obstetrics and Gynecology

## 2020-10-16 DIAGNOSIS — Z1231 Encounter for screening mammogram for malignant neoplasm of breast: Secondary | ICD-10-CM

## 2020-10-16 IMAGING — MG MM DIGITAL SCREENING BILAT W/ TOMO AND CAD
8 of 14 series · 8 of 40 positions shown · non-contrast
Comparison: Previous exam(s).

CLINICAL DATA: Screening.

EXAM:
DIGITAL SCREENING BILATERAL MAMMOGRAM WITH TOMOSYNTHESIS AND CAD
TECHNIQUE: Bilateral screening digital craniocaudal and mediolateral oblique
mammograms were obtained. Bilateral screening digital breast
tomosynthesis was performed. The images were evaluated with
computer-aided detection.

[R MLO synth-2D (1 of 2)]
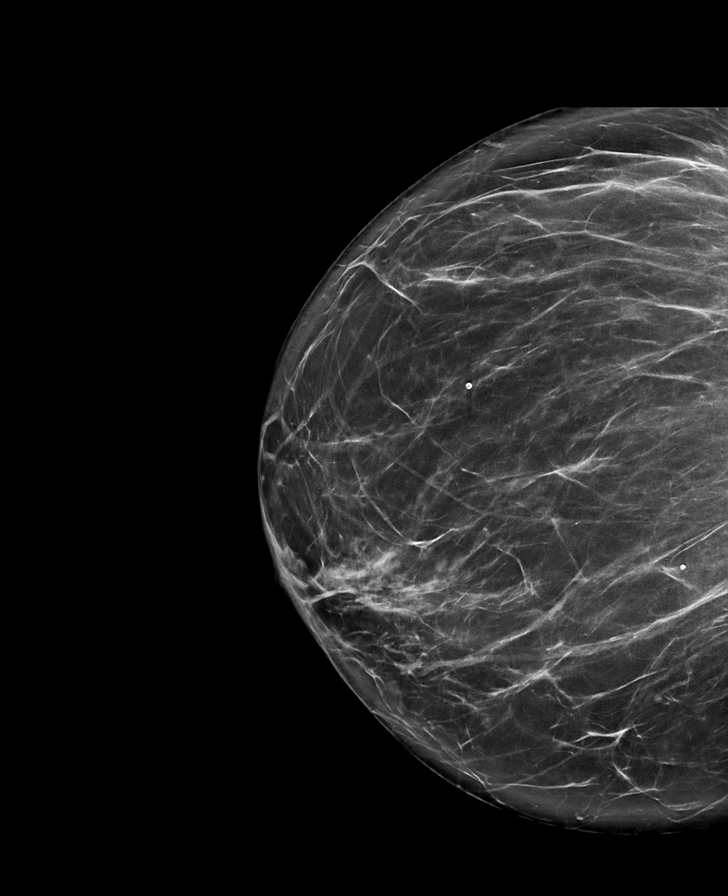

[L CC synth-2D]
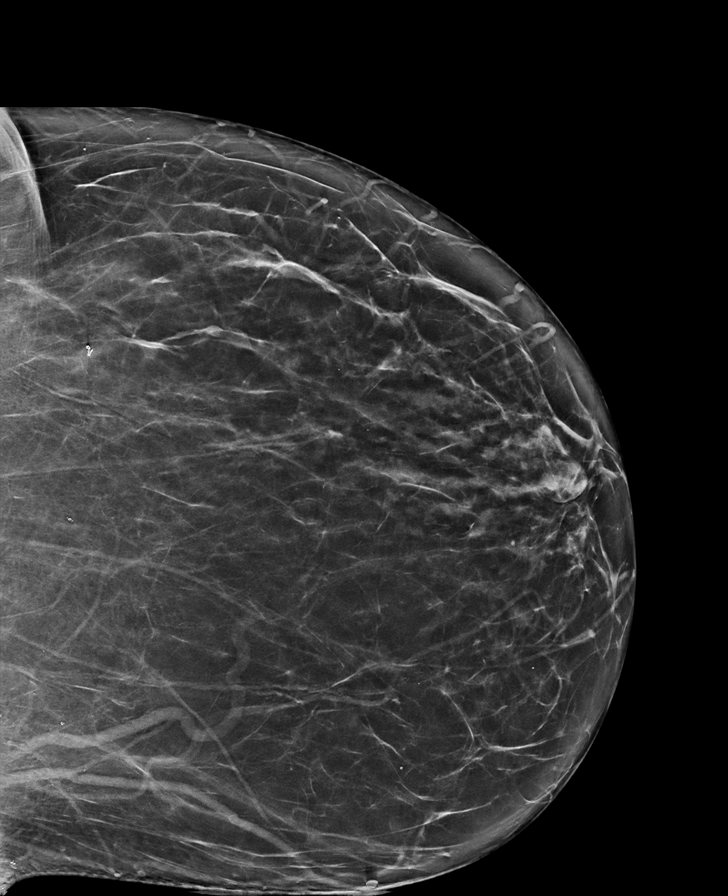

[R MLO synth-2D (2 of 2)]
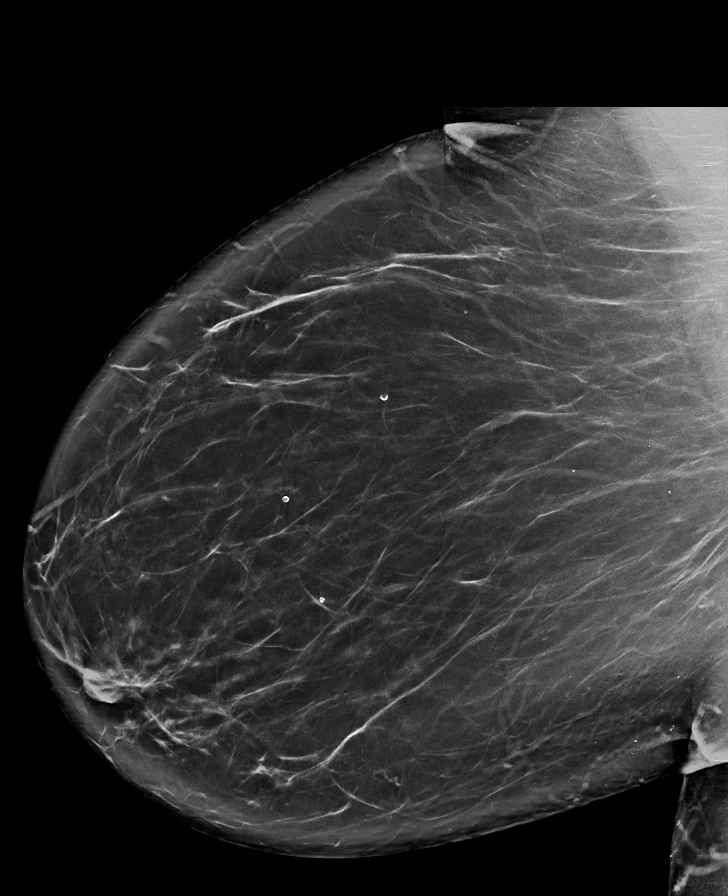

[L MLO synth-2D (1 of 2)]
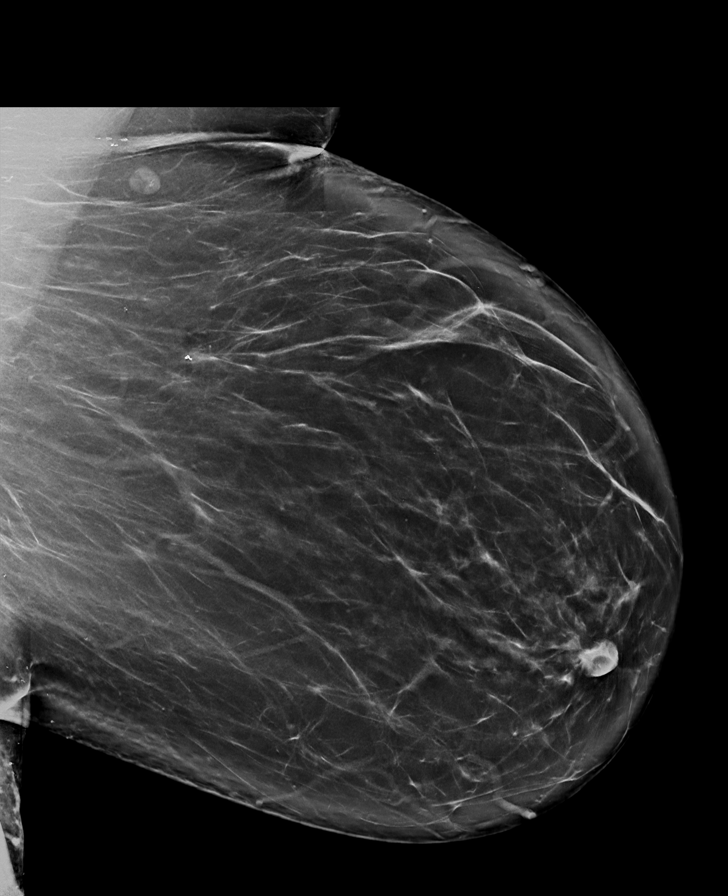

[R CC synth-2D]
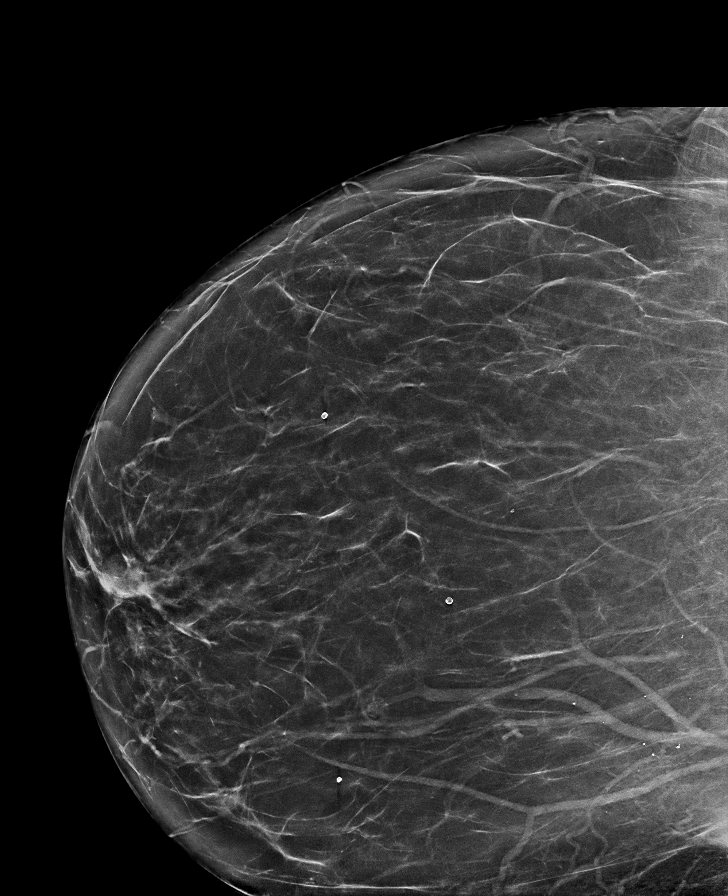

[L MLO synth-2D (2 of 2)]
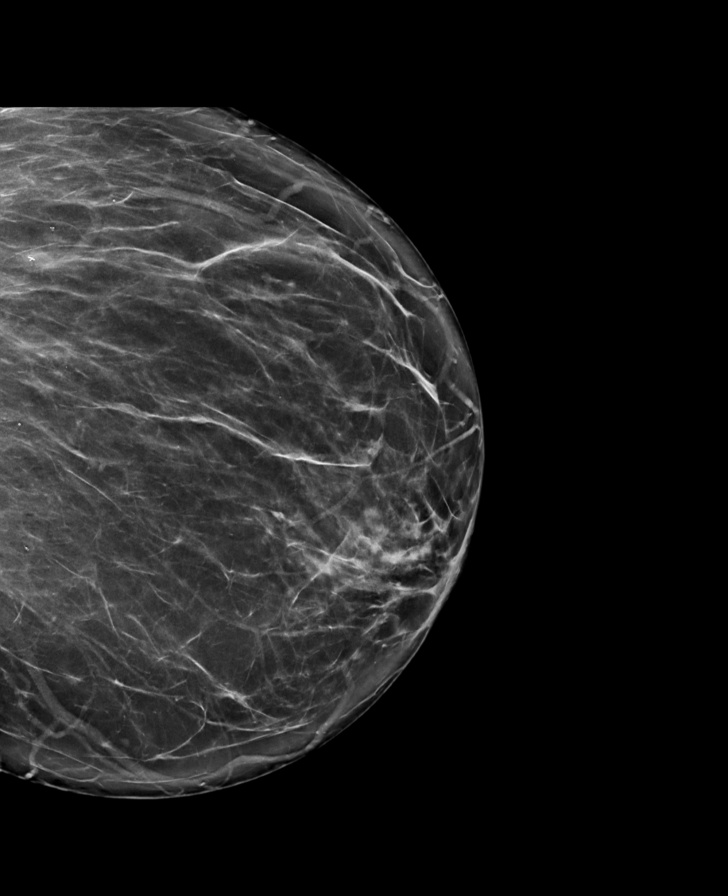

[R CV synth-2D]
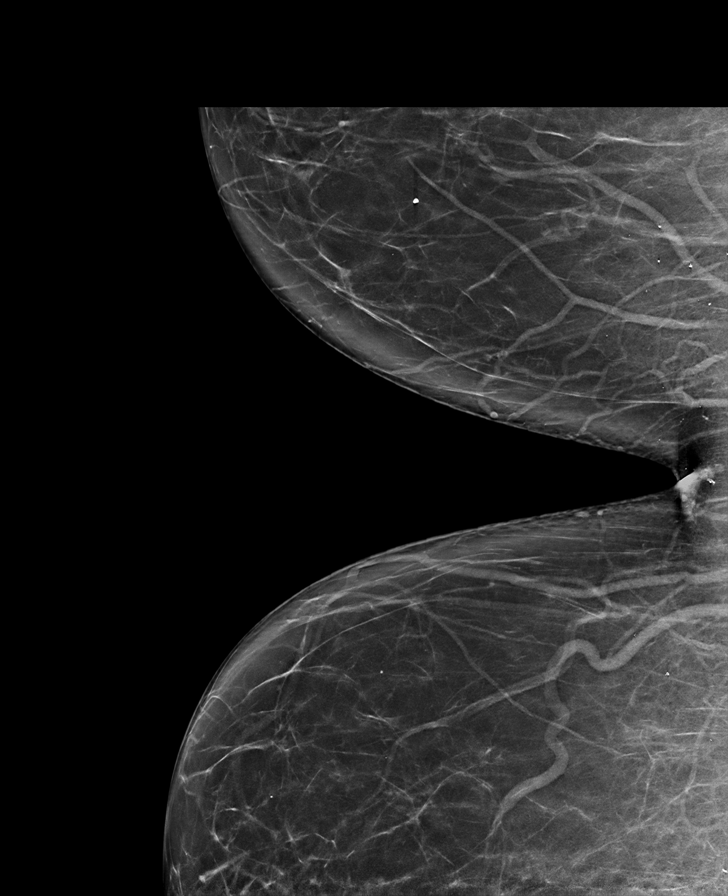

[R MLO tomo · tomo slice 50/99.0]
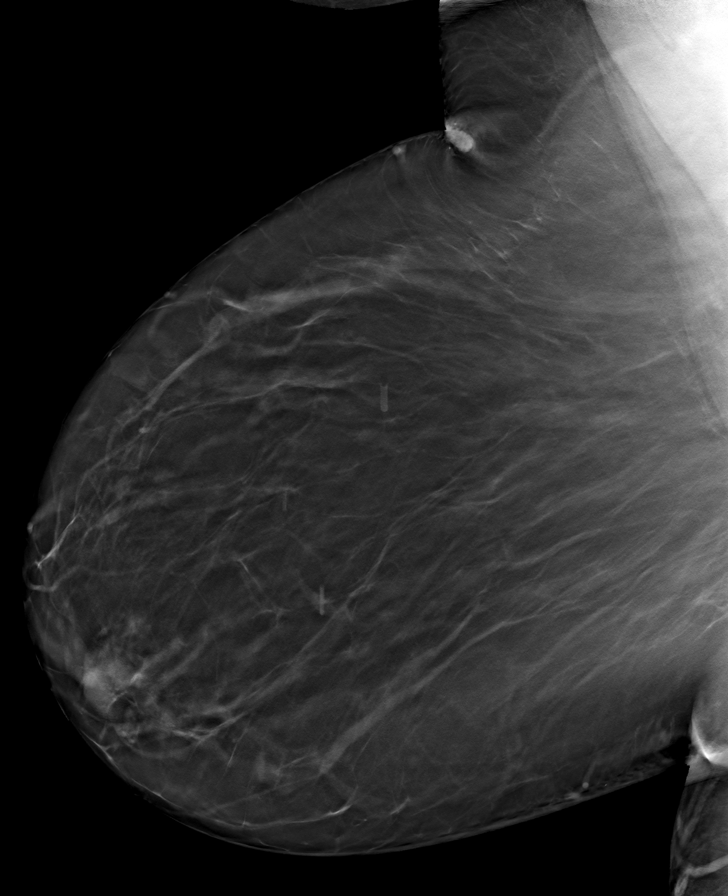

[8 of 40 positions shown; findings below may reference images not displayed]

ACR Breast Density Category b: There are scattered areas of
fibroglandular density.
FINDINGS: There are no findings suspicious for malignancy. The images were
evaluated with computer-aided detection.
IMPRESSION: No mammographic evidence of malignancy. A result letter of this
screening mammogram will be mailed directly to the patient.

RECOMMENDATION:
Screening mammogram in one year. (Code:[OD])

BI-RADS CATEGORY  1: Negative.

## 2020-10-19 ENCOUNTER — Other Ambulatory Visit: Payer: Self-pay | Admitting: Obstetrics

## 2020-10-19 DIAGNOSIS — Z1231 Encounter for screening mammogram for malignant neoplasm of breast: Secondary | ICD-10-CM

## 2020-12-04 ENCOUNTER — Ambulatory Visit (INDEPENDENT_AMBULATORY_CARE_PROVIDER_SITE_OTHER): Payer: Federal, State, Local not specified - PPO | Admitting: Psychology

## 2020-12-04 DIAGNOSIS — F509 Eating disorder, unspecified: Secondary | ICD-10-CM | POA: Diagnosis not present

## 2020-12-25 ENCOUNTER — Ambulatory Visit (INDEPENDENT_AMBULATORY_CARE_PROVIDER_SITE_OTHER): Payer: Federal, State, Local not specified - PPO | Admitting: Psychology

## 2020-12-25 DIAGNOSIS — F509 Eating disorder, unspecified: Secondary | ICD-10-CM

## 2020-12-27 ENCOUNTER — Encounter: Payer: Federal, State, Local not specified - PPO | Attending: Surgery | Admitting: Skilled Nursing Facility1

## 2020-12-27 ENCOUNTER — Other Ambulatory Visit: Payer: Self-pay

## 2020-12-27 ENCOUNTER — Encounter: Payer: Self-pay | Admitting: Skilled Nursing Facility1

## 2020-12-27 DIAGNOSIS — Z6841 Body Mass Index (BMI) 40.0 and over, adult: Secondary | ICD-10-CM | POA: Diagnosis present

## 2020-12-27 NOTE — Progress Notes (Signed)
Nutrition Assessment for Bariatric Surgery Medical Nutrition Therapy Appt Start Time: 9:31  End Time: 10:35  Patient was seen on 12/27/2020 for Pre-Operative Nutrition Assessment. Letter of approval faxed to [facility] bariatric surgery program coordinator on 12/27/2020.   Referral stated Supervised Weight Loss (SWL) visits needed: 0  Pt completed visits.   Pt has cleared nutrition requirements.    Planned surgery: sleeve gastrectomy  Pt expectation of surgery: to lose weight Pt expectation of dietitian: to get get me where I need to be     NUTRITION ASSESSMENT   Anthropometrics  Start weight at [facility]: 334.3 lbs (date: 12/27/2020)  Height: 65.5 in BMI: 54.80 kg/m2     Clinical  Medical hx: reflux Medications: see list  Labs: LDL 142, vitamin D 19 Notable signs/symptoms: knee pain  Any previous deficiencies? Yes: vitamin D, iron   Micronutrient Nutrition Focused Physical Exam: Hair: No issues observed Eyes: No issues observed Mouth: No issues observed Neck: No issues observed Nails: No issues observed Skin: No issues observed  Lifestyle & Dietary Hx  Pt states red meat and cheese upset her stomach.   Pt states she tries to drink enough water throughout the day.   24-Hr Dietary Recall First Meal: eggs + bacon + toast Snack: jello + fruit Second Meal: fried or baked chicken + corn + fried okra Snack: chips + nuts + pretzels Third Meal: cabbage + peas + green beans + fish Snack: doughnut sticks Beverages: water, lemonade, sweet tea, soda, coffee   Estimated Energy Needs Calories: 1500   NUTRITION DIAGNOSIS  Overweight/obesity ([place]-3.3) related to past poor dietary habits and physical inactivity as evidenced by patient w/ planned sleeve gastrectomy surgery following dietary guidelines for continued weight loss.    NUTRITION INTERVENTION  Nutrition counseling (C-1) and education (E-2) to facilitate bariatric surgery goals.  Educated pt on  micronutrient deficiencies post surgery and strategies to mitigate that risk   Pre-Op Goals Reviewed with the Patient Track food and beverage intake (pen and paper, MyFitness Pal, Baritastic app, etc.) Make healthy food choices while monitoring portion sizes Consume 3 meals per day or try to eat every 3-5 hours Avoid concentrated sugars and fried foods Keep sugar & fat in the single digits per serving on food labels Practice CHEWING your food (aim for applesauce consistency) Practice not drinking 15 minutes before, during, and 30 minutes after each meal and snack Avoid all carbonated beverages (ex: soda, sparkling beverages)  Limit caffeinated beverages (ex: coffee, tea, energy drinks) Avoid all sugar-sweetened beverages (ex: regular soda, sports drinks)  Avoid alcohol  Aim for 64-100 ounces of FLUID daily (with at least half of fluid intake being plain water)  Aim for at least 60-80 grams of PROTEIN daily Look for a liquid protein source that contains ?15 g protein and ?5 g carbohydrate (ex: shakes, drinks, shots) Make a list of non-food related activities Physical activity is an important part of a healthy lifestyle so keep it moving! The goal is to reach 150 minutes of exercise per week, including cardiovascular and weight baring activity.  *Goals that are bolded indicate the pt would like to start working towards these  Handouts Provided Include  Bariatric Surgery handouts (Nutrition Visits, Pre-Op Goals, Protein Shakes, Vitamins & Minerals)  Learning Style & Readiness for Change Teaching method utilized: Visual & Auditory  Demonstrated degree of understanding via: Teach Back  Readiness Level: contemplative  Barriers to learning/adherence to lifestyle change: unknown at this time     MONITORING &  EVALUATION Dietary intake, weekly physical activity, body weight, and pre-op goals reached at next nutrition visit.    Next Steps  Patient is to follow up at [facility] for Pre-Op Class >2  weeks before surgery for further nutrition education.   Pt has completed visits. No further supervised visits required

## 2021-01-16 ENCOUNTER — Ambulatory Visit (INDEPENDENT_AMBULATORY_CARE_PROVIDER_SITE_OTHER): Payer: Federal, State, Local not specified - PPO | Admitting: Neurology

## 2021-01-16 ENCOUNTER — Encounter: Payer: Self-pay | Admitting: Neurology

## 2021-01-16 VITALS — BP 126/78 | HR 93 | Ht 66.0 in | Wt 334.6 lb

## 2021-01-16 DIAGNOSIS — R351 Nocturia: Secondary | ICD-10-CM

## 2021-01-16 DIAGNOSIS — Z820 Family history of epilepsy and other diseases of the nervous system: Secondary | ICD-10-CM | POA: Diagnosis not present

## 2021-01-16 DIAGNOSIS — Z9189 Other specified personal risk factors, not elsewhere classified: Secondary | ICD-10-CM

## 2021-01-16 DIAGNOSIS — Z6841 Body Mass Index (BMI) 40.0 and over, adult: Secondary | ICD-10-CM

## 2021-01-16 DIAGNOSIS — R0683 Snoring: Secondary | ICD-10-CM | POA: Diagnosis not present

## 2021-01-16 DIAGNOSIS — R519 Headache, unspecified: Secondary | ICD-10-CM

## 2021-01-16 NOTE — Patient Instructions (Signed)

## 2021-01-16 NOTE — Progress Notes (Signed)
Subjective:    Patient ID: Charlotte Meyer is a 55 y.o. female.  HPI    Star Age, MD, PhD [facility] [address] [place], [place] [address]  Dear Dr. Thermon Leyland,   I saw your patient, Charlotte Meyer, upon your kind request in my Sleep clinic today for initial consultation of her sleep disorder, in particular, concern for underlying obstructive sleep apnea.  The patient is unaccompanied today. Of note, she missed an appointment on 09/26/20. As you know, Charlotte Meyer is a 55 year old right-handed woman with an underlying medical history of reflux disease, umbilical hernia, endometriosis, anemia, depression, allergic rhinitis, osteoarthritis, hyperlipidemia, and morbid obesity with a BMI of over 50, who reports snoring and difficulty going to sleep.  She has woken up in the recent past with a headache.  She has nocturia about once per average night.  Her brother has sleep apnea and has a CPAP machine.  Her weight has been fluctuating.  She has a long commute.  She uses a carpool van to commute to and from work.  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  She works from 7:30 AM to 4 PM daily.  She lives with her significant other and her 55 year old daughter who is getting ready to go to college at [employer].  They have no pets at the house.  She does have a TV in her bedroom and it is on at night but without volume.  She goes to bed generally around 11 PM and rise time is around 5 AM.  She drinks no data to caffeine, drinks decaf coffee.  She currently does not drink any alcohol.  She quit smoking some 20 years ago.  She denies any gasping sensation at night, no chest pain or shortness of breath but has occasional reflux symptoms at night depending on what she ate.   I reviewed your office note from 07/04/2020.  She is being evaluated for bariatric surgery and hernia repair.  Her Epworth sleepiness score is 6 out of 24, fatigue severity score is 13 out  of 63.  Her Past Medical History Is Significant For: Past Medical History:  Diagnosis Date   Allergic rhinitis    Anemia    Depression    treated with zoloft in past   GERD (gastroesophageal reflux disease)    History of UTI    Hyperlipemia    no treatment required   Lactose intolerance    OA (osteoarthritis) of knee    bilaterally    Obesity     Her Past Surgical History Is Significant For: Past Surgical History:  Procedure Laterality Date   CHOLECYSTECTOMY N/A 09/12/2015   Procedure: LAPAROSCOPIC CHOLECYSTECTOMY WITH INTRAOPERATIVE CHOLANGIOGRAM;  Surgeon: Jackolyn Confer, MD;  Location: [facility];  Service: General;  Laterality: N/A;   COLONOSCOPY WITH PROPOFOL N/A 03/08/2016   Procedure: COLONOSCOPY WITH PROPOFOL;  Surgeon: Otis Brace, MD;  Location: [facility];  Service: Gastroenterology;  Laterality: N/A;   FOOT SURGERY      Her Family History Is Significant For: Family History  Problem Relation Age of Onset   Cancer Father        throat   Sleep apnea Brother    Breast cancer Maternal Aunt        ? age of onset   Diabetes Other    Cholelithiasis Other     Her Social History Is Significant For: Social History   Socioeconomic History   Marital status:  Single    Spouse name: Not on file   Number of children: 1   Years of education: Not on file   Highest education level: Not on file  Occupational History    Employer: [employer]  Tobacco Use   Smoking status: Former   Smokeless tobacco: Never   Tobacco comments:    quit them "long time ago"  Vaping Use   Vaping Use: Never used  Substance and Sexual Activity   Alcohol use: No   Drug use: No   Sexual activity: Not on file  Other Topics Concern   Not on file  Social History Narrative   Daily caffeine use   Social Determinants of Health   Financial Resource Strain: Not on file  Food Insecurity: Not on file  Transportation Needs: Not on file  Physical Activity: Not on file  Stress:  Not on file  Social Connections: Not on file    Her Allergies Are:  Allergies  Allergen Reactions   Methocarbamol     Itching    Penicillins     Yeast infections Has patient had a PCN reaction causing immediate rash, facial/tongue/throat swelling, SOB or lightheadedness with hypotension: No Has patient had a PCN reaction causing severe rash involving mucus membranes or skin necrosis: No Has patient had a PCN reaction that required hospitalization No Has patient had a PCN reaction occurring within the last 10 years: No If all of the above answers are "NO", then may proceed with Cephalosporin use.    Tramadol Other (See Comments)    Loopy and confused  :   Her Current Medications Are:  Outpatient Encounter Medications as of 01/16/2021  Medication Sig   ALPRAZolam (XANAX) 0.5 MG tablet Take 1 tablet (0.5 mg total) by mouth 3 (three) times daily as needed.   b complex vitamins tablet Take 1 tablet by mouth daily.   Cholecalciferol (VITAMIN D3) 1.25 MG (50000 UT) CAPS Take 1 capsule by mouth once a week.   Cholecalciferol (VITAMIN D3) 50 MCG (2000 UT) capsule Take 1 capsule (2,000 Units total) by mouth daily.   fluconazole (DIFLUCAN) 150 MG tablet 1 by mouth every 3 days as needed   ibuprofen (ADVIL,MOTRIN) 800 MG tablet Take 1 tablet (800 mg total) by mouth 3 (three) times daily.   ipratropium (ATROVENT) 0.06 % nasal spray Place 2 sprays into both nostrils 4 (four) times daily.   pantoprazole (PROTONIX) 40 MG tablet Take 1 tablet (40 mg total) by mouth daily.   triamcinolone cream (KENALOG) 0.5 % Apply 1 application topically 3 (three) times daily as needed (for rash).   [DISCONTINUED] ciprofloxacin (CIPRO) 250 MG tablet Take 1 tablet (250 mg total) by mouth 2 (two) times daily.   [DISCONTINUED] folic acid (FOLVITE) 1 MG tablet Take 1 mg by mouth daily.   [DISCONTINUED] methylPREDNISolone (MEDROL DOSEPAK) 4 MG TBPK tablet 6 day dose pack - take as directed   Facility-Administered  Encounter Medications as of 01/16/2021  Medication   betamethasone acetate-betamethasone sodium phosphate (CELESTONE) injection 3 mg   betamethasone acetate-betamethasone sodium phosphate (CELESTONE) injection 3 mg  :   Review of Systems:  Out of a complete 14 point review of systems, all are reviewed and negative with the exception of these symptoms as listed below:  Review of Systems  Neurological:        Pt is here today because she is having bariatric surgery and they wanted her to get a sleep study done. Pt states she does snore  Epworth Sleepiness Scale 0= would never doze 1= slight chance of dozing 2= moderate chance of dozing 3= high chance of dozing  Sitting and reading:2 Watching TV:1 Sitting inactive in a public place (ex. Theater or meeting):0 As a passenger in a car for an hour without a break:2 Lying down to rest in the afternoon:1 Sitting and talking to someone:0 Sitting quietly after lunch (no alcohol):0 In a car, while stopped in traffic:0 Total: 4   Objective:  Neurological Exam  Physical Exam Physical Examination:   Vitals:   01/16/21 1457  BP: 126/78  Pulse: 93    General Examination: The patient is a very pleasant 55 y.o. female in no acute distress. She appears well-developed and well-nourished and well groomed.   HEENT: Normocephalic, atraumatic, pupils are equal, round and reactive to light, extraocular tracking is good without limitation to gaze excursion or nystagmus noted. Hearing is grossly intact. Face is symmetric with normal facial animation. Speech is clear with no dysarthria noted. There is no hypophonia. There is no lip, neck/head, jaw or voice tremor. Neck is supple with full range of passive and active motion. There are no carotid bruits on auscultation. Oropharynx exam reveals: mild mouth dryness, adequate dental hygiene with partial plates on top and bottom.  Mild airway crowding noted secondary to small airway entry.  Tonsils on the  smaller side, Mallampati class III.  Neck circumference of 16-1/4 inches.  She has a moderate overbite.  Tongue protrudes centrally and palate elevates symmetrically.   Chest: Clear to auscultation without wheezing, rhonchi or crackles noted.  Heart: S1+S2+0, regular and normal without murmurs, rubs or gallops noted.   Abdomen: Soft, non-tender and non-distended with normal bowel sounds appreciated on auscultation.  Extremities: There is no pitting edema in the distal lower extremities bilaterally.   Skin: Warm and dry without trophic changes noted.   Musculoskeletal: exam reveals decreased range of motion in the left knee and left knee pain.  She walks with a limp on the left.    Neurologically:  Mental status: The patient is awake, alert and oriented in all 4 spheres. Her immediate and remote memory, attention, language skills and fund of knowledge are appropriate. There is no evidence of aphasia, agnosia, apraxia or anomia. Speech is clear with normal prosody and enunciation. Thought process is linear. Mood is normal and affect is normal.  Cranial nerves II - XII are as described above under HEENT exam.  Motor exam: Normal bulk, strength and tone is noted. There is no tremor, Romberg is negative. Reflexes are 2+ throughout. Fine motor skills and coordination: grossly intact.  Cerebellar testing: No dysmetria or intention tremor. There is no truncal or gait ataxia.  Sensory exam: intact to light touch in the upper and lower extremities.  Gait, station and balance: She stands with difficulty and has to push herself up.  She walks with a limp, she brought a single-point cane but can walk some without her cane.  She reports left knee pain.  Assessment and Plan:   In summary, Dae F Heitkamp is a very pleasant 55 y.o.-year old female with an underlying medical history of reflux disease, umbilical hernia, endometriosis, anemia, depression, allergic rhinitis, osteoarthritis, hyperlipidemia, and  morbid obesity with a BMI of over 65, whose history and physical exam are concerning for obstructive sleep apnea (OSA). I had a long chat with the patient about my findings and the diagnosis of OSA, its prognosis and treatment options. We talked about medical treatments, surgical interventions and  non-pharmacological approaches. I explained in particular the risks and ramifications of untreated moderate to severe OSA, especially with respect to developing cardiovascular disease down the Road, including congestive heart failure, difficult to treat hypertension, cardiac arrhythmias, or stroke. Even type 2 diabetes has, in part, been linked to untreated OSA. Symptoms of untreated OSA include daytime sleepiness, memory problems, mood irritability and mood disorder such as depression and anxiety, lack of energy, as well as recurrent headaches, especially morning headaches. We talked about trying to maintain a healthy lifestyle in general, as well as the importance of weight control. We also talked about the importance of good sleep hygiene. I recommended the following at this time: sleep study.  I outlined to her the difference between a laboratory attended sleep study versus home sleep test.  We talked about possible surgical and non-surgical treatment options of OSA, including the use of a custom-made dental device (which would require a referral to a specialist dentist or oral surgeon), upper airway surgical options, such as traditional UPPP or a novel less invasive surgical option in the form of Inspire hypoglossal nerve stimulation (which would involve a referral to an ENT surgeon). I also explained the CPAP treatment option to the patient, who indicated that she would be willing to try CPAP if the need arises. I explained to her that in light of her elective bariatric surgery plans, positive airway pressure in the form of an AutoPap machine or CPAP machine would be the most realistic treatment option for her.  I  advised her of the importance of being compliant with PAP treatment, not only for insurance purposes but primarily to improve Her symptoms, and for the patient's long term health benefit, including to reduce Her cardiovascular risks. I answered all her questions today and the patient was in agreement. I plan to see her back after the sleep study is completed and encouraged her to call with any interim questions, concerns, problems or updates.   Thank you very much for allowing me to participate in the care of this nice patient. If I can be of any further assistance to you please do not hesitate to call me at [phone].  Sincerely,   Star Age, MD, PhD

## 2021-01-29 ENCOUNTER — Other Ambulatory Visit: Payer: Self-pay | Admitting: Gastroenterology

## 2021-01-30 ENCOUNTER — Ambulatory Visit (INDEPENDENT_AMBULATORY_CARE_PROVIDER_SITE_OTHER): Payer: Federal, State, Local not specified - PPO | Admitting: Neurology

## 2021-01-30 DIAGNOSIS — R0683 Snoring: Secondary | ICD-10-CM

## 2021-01-30 DIAGNOSIS — Z820 Family history of epilepsy and other diseases of the nervous system: Secondary | ICD-10-CM

## 2021-01-30 DIAGNOSIS — Z9189 Other specified personal risk factors, not elsewhere classified: Secondary | ICD-10-CM

## 2021-01-30 DIAGNOSIS — R519 Headache, unspecified: Secondary | ICD-10-CM

## 2021-01-30 DIAGNOSIS — G4733 Obstructive sleep apnea (adult) (pediatric): Secondary | ICD-10-CM

## 2021-01-30 DIAGNOSIS — R351 Nocturia: Secondary | ICD-10-CM

## 2021-02-07 ENCOUNTER — Encounter: Payer: Federal, State, Local not specified - PPO | Attending: Surgery | Admitting: Skilled Nursing Facility1

## 2021-02-07 ENCOUNTER — Other Ambulatory Visit: Payer: Self-pay

## 2021-02-07 DIAGNOSIS — Z6841 Body Mass Index (BMI) 40.0 and over, adult: Secondary | ICD-10-CM | POA: Insufficient documentation

## 2021-02-07 NOTE — Progress Notes (Signed)
Supervised Weight Loss Visit Bariatric Nutrition Education  Planned Surgery: sleeve gastrectomy   1 out of 3 SWL Appointments    NUTRITION ASSESSMENT  Anthropometrics  Start weight at [facility]: 334.3 lbs (date: 12/27/2020)  Weight: 335.5 pounds BMI: 54.98 kg/m2     Clinical  Medical hx: reflux Medications: see list  Labs: LDL 142, vitamin D 19 Notable signs/symptoms: knee pain  Any previous deficiencies? Yes: vitamin D, iron   Lifestyle & Dietary Hx  Pt states she tried premier protein and it gave her gas.  Pt states she gets her breakfast and lunch at her work [employer].  Pt states she has cut out soda completely.  Pt states she has been doing pretty good with not eating fried foods.   Estimated daily fluid intake: 80 oz Supplements:  Current average weekly physical activity: ADL's  24-Hr Dietary Recall First Meal: 1 egg + 1 wheat bread or avacado toast + egg Snack: jello Second Meal: skipped or turnip greens + baked fish Snack: chocolate pretzels Third Meal: 1 boiled egg or skipped Snack:  Beverages: water  Estimated Energy Needs Calories: 1500   NUTRITION DIAGNOSIS  Overweight/obesity ([place]-3.3) related to past poor dietary habits and physical inactivity as evidenced by patient w/ planned sleeve gastrectomy surgery following dietary guidelines for continued weight loss.   NUTRITION INTERVENTION  Nutrition counseling (C-1) and education (E-2) to facilitate bariatric surgery goals.  Pre-Op Goals Progress & New Goals Continue: Avoid concentrated sugars and fried foods Continue: Avoid all sugar-sweetened beverages (ex: regular soda, sports drinks)  NEW: log everything you eat and drink: take notes in your phone NEW: once a week walk 10 minutes  NEW: try some protein shakes to avoid getting gas  Handouts Provided Include  N/A  Learning Style & Readiness for Change Teaching method utilized: Visual & Auditory  Demonstrated degree of understanding via: Teach Back   Readiness Level: contemplative  Barriers to learning/adherence to lifestyle change: unknown at this time  RD's Notes for next Visit  Assess pts adherence to chosen goals   MONITORING & EVALUATION Dietary intake, weekly physical activity, body weight, and pre-op goals in 1 month.   Next Steps  Patient is to return to [facility] in 1 month

## 2021-03-07 ENCOUNTER — Telehealth: Payer: Self-pay | Admitting: Neurology

## 2021-03-07 DIAGNOSIS — G4733 Obstructive sleep apnea (adult) (pediatric): Secondary | ICD-10-CM

## 2021-03-07 NOTE — Telephone Encounter (Signed)
Pt called wanting to know when she will be informed her her Sleep Results. Please advise.

## 2021-03-07 NOTE — Addendum Note (Signed)
Addended by: Star Age on: 03/07/2021 05:18 PM   Modules accepted: Orders

## 2021-03-07 NOTE — Telephone Encounter (Signed)
autoPAP order placed. Thank you.

## 2021-03-07 NOTE — Procedures (Signed)
     [facility]  HOME SLEEP TEST (Watch PAT) REPORT  STUDY DATE: 02/11/2021  DOB: 05/27/1966  MRN: [id number]  ORDERING CLINICIAN: Star Age, MD, PhD   REFERRING CLINICIAN: Dr. Thermon Leyland  CLINICAL INFORMATION/HISTORY: 55 year old right-handed woman with an underlying medical history of reflux disease, umbilical hernia, endometriosis, anemia, depression, allergic rhinitis, osteoarthritis, hyperlipidemia, and morbid obesity with a BMI of over 50, who reports snoring and difficulty going to sleep.  She has woken up in the recent past with a headache.   Epworth sleepiness score: 6/24.  BMI: 53.9 kg/m  FINDINGS:   Sleep Summary:   Total Recording Time (hours, min): 8 hours, 4 minutes  Total Sleep Time (hours, min):  6 hours, 58 minutes   Percent REM (%):    37.2%   Respiratory Indices:   Calculated pAHI (per hour):  10.5/hour         REM pAHI:    22/hour       NREM pAHI: 8.5/hour  Oxygen Saturation Statistics:    Oxygen Saturation (%) Mean: 96%   Minimum oxygen saturation (%):                 89%   O2 Saturation Range (%): 89-100%    O2 Saturation (minutes) <=88%: 0 min  Pulse Rate Statistics:   Pulse Mean (bpm):    82/min    Pulse Range (65-112/min)   IMPRESSION: OSA (obstructive sleep apnea), mild  RECOMMENDATION:  This home sleep test demonstrates overall mild obstructive sleep apnea with a total AHI of 10.5/hour and O2 nadir of 89%.  Intermittent mild to moderate snoring was noted.  Given the patient's medical history and sleep related complaints, treatment with positive airway pressure is a reasonable choice.  Treatment can be achieved in the form of autoPAP trial/titration at home. A  full night CPAP titration study will help with proper treatment settings and mask fitting if needed. Alternative treatments include weight loss along with avoidance of the supine sleep position, or an oral appliance in appropriate candidates.   Please  note that untreated obstructive sleep apnea may carry additional perioperative morbidity. Patients with significant obstructive sleep apnea should receive perioperative PAP therapy and the surgeons and particularly the anesthesiologist should be informed of the diagnosis and the severity of the sleep disordered breathing. The patient should be cautioned not to drive, work at heights, or operate dangerous or heavy equipment when tired or sleepy. Review and reiteration of good sleep hygiene measures should be pursued with any patient. Other causes of the patient's symptoms, including circadian rhythm disturbances, an underlying mood disorder, medication effect and/or an underlying medical problem cannot be ruled out based on this test. Clinical correlation is recommended.   The patient and her referring provider will be notified of the test results. The patient will be seen in follow up in sleep clinic at [facility].  I certify that I have reviewed the raw data recording prior to the issuance of this report in accordance with the standards of the American Academy of Sleep Medicine (AASM).  INTERPRETING PHYSICIAN:   Star Age, MD, PhD  Board Certified in Neurology and Sleep Medicine  [facility] [address], [place], [place] [address] [phone]

## 2021-03-07 NOTE — Telephone Encounter (Addendum)
Spoke with patient and discussed sleep study results.  Patient is amenable to trying an AutoPap machine.  We discussed an order will be sent to [facility].  Patient aware she will need to be seen between 30 and 90 days after starting the machine.  We went ahead and scheduled an appointment for November 29 at 9:45 AM.  Patient aware insurance will require that she use the machine at least 4 hours every night.  [employer] will fit her for a mask, show her how to use the machine, and be her point of contact for technical questions.  Her questions were answered.  I apologized greatly for the delay in getting her results to her.  She verbalized appreciation for the call.

## 2021-03-07 NOTE — Telephone Encounter (Signed)
I do apologize, it was an oversight, we are working now on the report and getting it ready before end of day today.

## 2021-03-07 NOTE — Progress Notes (Signed)
See procedure note.

## 2021-03-07 NOTE — Telephone Encounter (Signed)
Star Age, MD  03/07/2021  4:38 PM EDT Back to Top    Patient referred by Dr. Thermon Leyland, seen by me on 01/16/2021, HST on 02/11/2021.   Please apologize on my behalf for the delay, the report had been overlooked. Please call and notify the patient that the recent home sleep test showed obstructive sleep apnea. OSA is overall mild, but would be worth treating to see if she feels better after treatment. To that end, we can consider a so-called AutoPap machine for treatment of her sleep apnea.  I have not placed an order yet, if she would like to go ahead and proceed, we can set her up through a DME company with an AutoPap which is a CPAP-like machine at home.  She has had some recurrent headaches and difficulty sleeping.  In light of her bariatric surgery plans, it may be worthwhile getting started on AutoPap therapy for even mild obstructive sleep apnea.  Again, let me know.    Star Age, MD, PhD [facility])

## 2021-03-08 ENCOUNTER — Encounter: Payer: Federal, State, Local not specified - PPO | Attending: Surgery | Admitting: Skilled Nursing Facility1

## 2021-03-08 ENCOUNTER — Encounter: Payer: Federal, State, Local not specified - PPO | Admitting: Skilled Nursing Facility1

## 2021-03-08 DIAGNOSIS — E669 Obesity, unspecified: Secondary | ICD-10-CM | POA: Diagnosis not present

## 2021-03-08 NOTE — Progress Notes (Signed)
Supervised Weight Loss Visit Bariatric Nutrition Education  Planned Surgery: sleeve gastrectomy   2 out of 3 SWL Appointments    NUTRITION ASSESSMENT  Anthropometrics  Start weight at [facility]: 334.3 lbs (date: 12/27/2020)  Weight: virtual appt BMI:  kg/m2     Clinical  Medical hx: reflux Medications: see list  Labs: LDL 142, vitamin D 19 Notable signs/symptoms: knee pain  Any previous deficiencies? Yes: vitamin D, iron   Lifestyle & Dietary Hx  Pt had many issues with the virtual platform stating she had a work thing come up and needed to do it this way.  Pt states she realized it was the onions giving her gas not the protein shakes.  Pt states she has not logged her foods consistently.  Pt states she was able to walk once a week for 10 minutes but the had ankle pain so stopped.  Pt states she has an endoscopy on the 25th of October.    Estimated daily fluid intake: 80 oz Supplements:  Current average weekly physical activity: ADL's  24-Hr Dietary Recall First Meal: egg + peppers + tomato Snack: jello Second Meal: skipped or turnip greens + baked fish or cabbage + chicken + beets  Snack: chocolate pretzels or veggies + ranch Third Meal: green beans Snack:  Beverages: water  Estimated Energy Needs Calories: 1500   NUTRITION DIAGNOSIS  Overweight/obesity ([place]-3.3) related to past poor dietary habits and physical inactivity as evidenced by patient w/ planned sleeve gastrectomy surgery following dietary guidelines for continued weight loss.   NUTRITION INTERVENTION  Nutrition counseling (C-1) and education (E-2) to facilitate bariatric surgery goals.  Pre-Op Goals Progress & New Goals Continue: Avoid concentrated sugars and fried foods Continue: Avoid all sugar-sweetened beverages (ex: regular soda, sports drinks)  continue: log everything you eat and drink: take notes in your phone continue: once a week walk 10 minutes  continue: try some protein shakes to avoid  getting gas NEW: have a protein with dinner  Handouts Provided Include  N/A  Learning Style & Readiness for Change Teaching method utilized: Visual & Auditory  Demonstrated degree of understanding via: Teach Back  Readiness Level: contemplative  Barriers to learning/adherence to lifestyle change: unknown at this time  RD's Notes for next Visit  Assess pts adherence to chosen goals   MONITORING & EVALUATION Dietary intake, weekly physical activity, body weight, and pre-op goals in 1 month.   Next Steps  Patient is to return to [facility] in 1 month

## 2021-03-08 NOTE — Telephone Encounter (Signed)
AutoPAP order sent to [facility].

## 2021-03-23 ENCOUNTER — Encounter (HOSPITAL_COMMUNITY): Payer: Self-pay | Admitting: Gastroenterology

## 2021-03-29 NOTE — Telephone Encounter (Signed)
Pt called, received a message from [employer]; autoPAP  machine is back logged may be 7 to 8 weeks. Will call to reschedule once receive machine.

## 2021-03-29 NOTE — Telephone Encounter (Signed)
Perfect! Thanks so much!

## 2021-03-29 NOTE — Telephone Encounter (Signed)
Noted thank you

## 2021-03-29 NOTE — Telephone Encounter (Signed)
Pt called, spoke with [employer] scheduled to pick up machine on 04/04/21.   Scheduled Initial autoPAP on 05/08/21 at 9:45a. That is the previous appt time. She only has that day off of work.

## 2021-04-03 ENCOUNTER — Ambulatory Visit (HOSPITAL_COMMUNITY)
Admission: RE | Admit: 2021-04-03 | Discharge: 2021-04-03 | Disposition: A | Discharge status: Home / Self Care | Payer: Federal, State, Local not specified - PPO | Attending: Gastroenterology | Admitting: Gastroenterology

## 2021-04-03 ENCOUNTER — Encounter (HOSPITAL_COMMUNITY): Payer: Self-pay | Admitting: Gastroenterology

## 2021-04-03 ENCOUNTER — Other Ambulatory Visit: Payer: Self-pay

## 2021-04-03 ENCOUNTER — Ambulatory Visit (HOSPITAL_COMMUNITY): Payer: Federal, State, Local not specified - PPO | Admitting: Anesthesiology

## 2021-04-03 ENCOUNTER — Encounter (HOSPITAL_COMMUNITY)
Admission: RE | Disposition: A | Discharge status: Home / Self Care | Payer: Self-pay | Source: Home / Self Care | Attending: Gastroenterology

## 2021-04-03 DIAGNOSIS — D122 Benign neoplasm of ascending colon: Secondary | ICD-10-CM | POA: Diagnosis not present

## 2021-04-03 DIAGNOSIS — Z6841 Body Mass Index (BMI) 40.0 and over, adult: Secondary | ICD-10-CM | POA: Diagnosis not present

## 2021-04-03 DIAGNOSIS — Z86010 Personal history of colonic polyps: Secondary | ICD-10-CM | POA: Diagnosis not present

## 2021-04-03 DIAGNOSIS — K5730 Diverticulosis of large intestine without perforation or abscess without bleeding: Secondary | ICD-10-CM | POA: Insufficient documentation

## 2021-04-03 DIAGNOSIS — K2950 Unspecified chronic gastritis without bleeding: Secondary | ICD-10-CM | POA: Diagnosis not present

## 2021-04-03 DIAGNOSIS — D123 Benign neoplasm of transverse colon: Secondary | ICD-10-CM | POA: Insufficient documentation

## 2021-04-03 DIAGNOSIS — K2270 Barrett's esophagus without dysplasia: Secondary | ICD-10-CM | POA: Diagnosis not present

## 2021-04-03 DIAGNOSIS — K648 Other hemorrhoids: Secondary | ICD-10-CM | POA: Insufficient documentation

## 2021-04-03 DIAGNOSIS — Z885 Allergy status to narcotic agent status: Secondary | ICD-10-CM | POA: Insufficient documentation

## 2021-04-03 DIAGNOSIS — K319 Disease of stomach and duodenum, unspecified: Secondary | ICD-10-CM | POA: Insufficient documentation

## 2021-04-03 DIAGNOSIS — Z87891 Personal history of nicotine dependence: Secondary | ICD-10-CM | POA: Diagnosis not present

## 2021-04-03 DIAGNOSIS — Z880 Allergy status to penicillin: Secondary | ICD-10-CM | POA: Insufficient documentation

## 2021-04-03 DIAGNOSIS — Z1211 Encounter for screening for malignant neoplasm of colon: Secondary | ICD-10-CM | POA: Insufficient documentation

## 2021-04-03 HISTORY — PX: ESOPHAGOGASTRODUODENOSCOPY: SHX5428

## 2021-04-03 HISTORY — PX: POLYPECTOMY: SHX5525

## 2021-04-03 HISTORY — PX: BIOPSY: SHX5522

## 2021-04-03 HISTORY — PX: COLONOSCOPY WITH PROPOFOL: SHX5780

## 2021-04-03 SURGERY — COLONOSCOPY WITH PROPOFOL
Anesthesia: Monitor Anesthesia Care

## 2021-04-03 MED ORDER — LACTATED RINGERS IV SOLN: INTRAVENOUS | Status: DC

## 2021-04-03 MED ORDER — SODIUM CHLORIDE 0.9 % IV SOLN: INTRAVENOUS | Status: DC

## 2021-04-03 MED ORDER — PROPOFOL 10 MG/ML IV BOLUS
INTRAVENOUS | Status: DC | PRN
  Administered 2021-04-03: 50 mg via INTRAVENOUS
  Administered 2021-04-03: 30 mg via INTRAVENOUS
  Administered 2021-04-03: 20 mg via INTRAVENOUS

## 2021-04-03 MED ORDER — PROPOFOL 500 MG/50ML IV EMUL
INTRAVENOUS | Status: DC | PRN
  Administered 2021-04-03: 100 ug/kg/min via INTRAVENOUS

## 2021-04-03 MED ORDER — METOPROLOL TARTRATE 5 MG/5ML IV SOLN
INTRAVENOUS | Status: DC | PRN
  Administered 2021-04-03 (×2): 2.5 mg via INTRAVENOUS

## 2021-04-03 MED ORDER — LIDOCAINE 2% (20 MG/ML) 5 ML SYRINGE
INTRAMUSCULAR | Status: DC | PRN
  Administered 2021-04-03: 100 mg via INTRAVENOUS

## 2021-04-03 MED ORDER — STERILE WATER FOR IRRIGATION IR SOLN
Status: DC | PRN
  Administered 2021-04-03: 60 mL
  Administered 2021-04-03 (×2): 30 mL

## 2021-04-03 MED ORDER — LACTATED RINGERS IV SOLN
INTRAVENOUS | Status: AC | PRN
  Administered 2021-04-03: 10 mL/h via INTRAVENOUS

## 2021-04-03 SURGICAL SUPPLY — 22 items

## 2021-04-03 NOTE — Anesthesia Preprocedure Evaluation (Addendum)
Anesthesia Evaluation  Patient identified by MRN, date of birth, ID band Patient awake    Reviewed: Allergy & Precautions, NPO status , Patient's Chart, lab work & pertinent test results  Airway Mallampati: III  TM Distance: >3 FB Neck ROM: Full    Dental  (+) Partial Upper, Partial Lower, Missing   Pulmonary neg pulmonary ROS, former smoker,    Pulmonary exam normal        Cardiovascular negative cardio ROS   Rhythm:Regular Rate:Normal     Neuro/Psych Anxiety Depression    GI/Hepatic Neg liver ROS, GERD  Medicated,Colon polyps   Endo/Other  Morbid obesity  Renal/GU negative Renal ROS  negative genitourinary   Musculoskeletal  (+) Arthritis , Osteoarthritis,    Abdominal (+)  Abdomen: soft.    Peds  Hematology  (+) anemia ,   Anesthesia Other Findings   Reproductive/Obstetrics                            Anesthesia Physical Anesthesia Plan  ASA: 3  Anesthesia Plan: MAC   Post-op Pain Management:    Induction: Intravenous  PONV Risk Score and Plan: 2 and Propofol infusion and Treatment may vary due to age or medical condition  Airway Management Planned: Simple Face Mask, Natural Airway and Nasal Cannula  Additional Equipment: None  Intra-op Plan:   Post-operative Plan:   Informed Consent: I have reviewed the patients History and Physical, chart, labs and discussed the procedure including the risks, benefits and alternatives for the proposed anesthesia with the patient or authorized representative who has indicated his/her understanding and acceptance.     Dental advisory given  Plan Discussed with: CRNA  Anesthesia Plan Comments:         Anesthesia Quick Evaluation

## 2021-04-03 NOTE — Op Note (Signed)
[facility] Patient Name: Charlotte Meyer Procedure Date: 04/03/2021 MRN: [id number] Attending MD: Otis Brace , MD Date of Birth: 04-Aug-1965 CSN: [id number] Age: 55 Admit Type: Outpatient Procedure:                Colonoscopy Indications:              Surveillance: Personal history of adenomatous                            polyps on last colonoscopy > 5 years ago, Last                            colonoscopy: 2017 Providers:                Otis Brace, MD, Dulcy Fanny, Luan Moore, Technician, Heide Scales, CRNA Referring MD:              Medicines:                Sedation Administered by an Anesthesia Professional Complications:            No immediate complications. Estimated Blood Loss:     Estimated blood loss was minimal. Procedure:                Pre-Anesthesia Assessment:                           - Prior to the procedure, a History and Physical                            was performed, and patient medications and                            allergies were reviewed. The patient's tolerance of                            previous anesthesia was also reviewed. The risks                            and benefits of the procedure and the sedation                            options and risks were discussed with the patient.                            All questions were answered, and informed consent                            was obtained. Prior Anticoagulants: The patient has                            taken no previous anticoagulant or antiplatelet  agents. ASA Grade Assessment: III - A patient with                            severe systemic disease. After reviewing the risks                            and benefits, the patient was deemed in                            satisfactory condition to undergo the procedure.                           - Prior to the procedure, a History and Physical                             was performed, and patient medications and                            allergies were reviewed. The patient's tolerance of                            previous anesthesia was also reviewed. The risks                            and benefits of the procedure and the sedation                            options and risks were discussed with the patient.                            All questions were answered, and informed consent                            was obtained. Prior Anticoagulants: The patient has                            taken no previous anticoagulant or antiplatelet                            agents. ASA Grade Assessment: III - A patient with                            severe systemic disease. After reviewing the risks                            and benefits, the patient was deemed in                            satisfactory condition to undergo the procedure.                           After obtaining informed consent, the colonoscope  was passed under direct vision. Throughout the                            procedure, the patient's blood pressure, pulse, and                            oxygen saturations were monitored continuously. The                            [id number]-HQ190L ([id number]) Olympus colonoscope was                            introduced through the anus and advanced to the the                            cecum, identified by appendiceal orifice and                            ileocecal valve. The colonoscopy was performed with                            moderate difficulty due to significant looping.                            Successful completion of the procedure was aided by                            applying abdominal pressure. The patient tolerated                            the procedure well. The quality of the bowel                            preparation was adequate to identify polyps 6 mm                            and  larger in size. Scope In: 10:23:38 AM Scope Out: 10:39:44 AM Scope Withdrawal Time: 0 hours 12 minutes 16 seconds  Total Procedure Duration: 0 hours 16 minutes 6 seconds  Findings:      The perianal and digital rectal examinations were normal.      A 7 mm polyp was found in the ascending colon. The polyp was sessile.       The polyp was removed with a cold snare. Resection and retrieval were       complete.      A 3 mm polyp was found in the transverse colon. The polyp was sessile.       The polyp was removed with a cold snare. Resection and retrieval were       complete.      Many small and large-mouthed diverticula were found in the sigmoid colon       and descending colon.      Internal hemorrhoids were found during retroflexion. The hemorrhoids       were medium-sized. Impression:               -  One 7 mm polyp in the ascending colon, removed                            with a cold snare. Resected and retrieved.                           - One 3 mm polyp in the transverse colon, removed                            with a cold snare. Resected and retrieved.                           - Diverticulosis in the sigmoid colon and in the                            descending colon.                           - Internal hemorrhoids. Moderate Sedation:      Moderate (conscious) sedation was personally administered by an       anesthesia professional. The following parameters were monitored: oxygen       saturation, heart rate, blood pressure, and response to care. Recommendation:           - Patient has a contact number available for                            emergencies. The signs and symptoms of potential                            delayed complications were discussed with the                            patient. Return to normal activities tomorrow.                            Written discharge instructions were provided to the                            patient.                            - Resume previous diet.                           - Continue present medications.                           - Await pathology results.                           - Repeat colonoscopy date to be determined after                            pending pathology results are reviewed for  surveillance based on pathology results.                           - Return to GI clinic PRN. Procedure Code(s):        --- Professional ---                           [phone], Colonoscopy, flexible; with removal of                            tumor(s), polyp(s), or other lesion(s) by snare                            technique Diagnosis Code(s):        --- Professional ---                           Z86.010, Personal history of colonic polyps                           K63.5, Polyp of colon                           K64.8, Other hemorrhoids                           K57.30, Diverticulosis of large intestine without                            perforation or abscess without bleeding CPT copyright 2019 American Medical Association. All rights reserved. The codes documented in this report are preliminary and upon coder review may  be revised to meet current compliance requirements. Otis Brace, MD Otis Brace, MD 04/03/2021 10:52:43 AM Number of Addenda: 0

## 2021-04-03 NOTE — Discharge Instructions (Signed)
YOU HAD AN ENDOSCOPIC PROCEDURE TODAY: Refer to the procedure report and other information in the discharge instructions given to you for any specific questions about what was found during the examination. If this information does not answer your questions, please call [facility] office at [phone] to clarify.   YOU SHOULD EXPECT: Some feelings of bloating in the abdomen. Passage of more gas than usual. Walking can help get rid of the air that was put into your GI tract during the procedure and reduce the bloating. If you had a lower endoscopy (such as a colonoscopy or flexible sigmoidoscopy) you may notice spotting of blood in your stool or on the toilet paper. Some abdominal soreness may be present for a day or two, also.  DIET: Your first meal following the procedure should be a light meal and then it is ok to progress to your normal diet. A half-sandwich or bowl of soup is an example of a good first meal. Heavy or fried foods are harder to digest and may make you feel nauseous or bloated. Drink plenty of fluids but you should avoid alcoholic beverages for 24 hours.    ACTIVITY: Your care partner should take you home directly after the procedure. You should plan to take it easy, moving slowly for the rest of the day. You can resume normal activity the day after the procedure however YOU SHOULD NOT DRIVE, use power tools, machinery or perform tasks that involve climbing or major physical exertion for 24 hours (because of the sedation medicines used during the test).   SYMPTOMS TO REPORT IMMEDIATELY: A gastroenterologist can be reached at any hour. Please call [phone]  for any of the following symptoms:  . Following lower endoscopy (colonoscopy, flexible sigmoidoscopy) Excessive amounts of blood in the stool  Significant tenderness, worsening of abdominal pains  Swelling of the abdomen that is new, acute  Fever of 100 or higher  . Following upper endoscopy (EGD, EUS, ERCP, esophageal  dilation) Vomiting of blood or coffee ground material  New, significant abdominal pain  New, significant chest pain or pain under the shoulder blades  Painful or persistently difficult swallowing  New shortness of breath  Black, tarry-looking or red, bloody stools  FOLLOW UP:  If any biopsies were taken you will be contacted by phone or by letter within the next 1-3 weeks. Call [phone]  if you have not heard about the biopsies in 3 weeks.  Please also call with any specific questions about appointments or follow up tests.  

## 2021-04-03 NOTE — Anesthesia Postprocedure Evaluation (Signed)
Anesthesia Post Note  Patient: Charlotte Meyer  Procedure(s) Performed: COLONOSCOPY WITH PROPOFOL ESOPHAGOGASTRODUODENOSCOPY (EGD) BIOPSY POLYPECTOMY     Patient location during evaluation: Endoscopy Anesthesia Type: MAC Level of consciousness: awake and alert Pain management: pain level controlled Vital Signs Assessment: post-procedure vital signs reviewed and stable Respiratory status: spontaneous breathing, nonlabored ventilation, respiratory function stable and patient connected to nasal cannula oxygen Cardiovascular status: stable and blood pressure returned to baseline Postop Assessment: no apparent nausea or vomiting Anesthetic complications: no   No notable events documented.  Last Vitals:  Vitals:   04/03/21 1056 04/03/21 1106  BP:  128/75  Pulse: 79 81  Resp: (!) 25 (!) 26  Temp:    SpO2: 100% 100%    Last Pain:  Vitals:   04/03/21 1106  TempSrc:   PainSc: 0-No pain                 Belenda Cruise P Micharl Helmes

## 2021-04-03 NOTE — H&P (Signed)
Primary Care Physician:  Bartholome Bill, MD Primary Gastroenterologist:  [facility]  Reason for Visit : Surveillance colonoscopy and EGD  HPI: Charlotte Meyer is a 55 y.o. female here for outpatient EGD and colonoscopy.  Personal history of adenomatous polyp during last colonoscopy in 2017 patient with history of morbid obesity.  Occasional reflux.  She is under consideration for bariatric surgery and they have recommended EGD for further evaluation of reflux and to rule out H. pylori.  Patient denies any GI symptoms except for occasional reflux depending on certain foods.  Past Medical History:  Diagnosis Date   Allergic rhinitis    Anemia    Depression    treated with zoloft in past   GERD (gastroesophageal reflux disease)    History of UTI    Hyperlipemia    no treatment required   Lactose intolerance    OA (osteoarthritis) of knee    bilaterally    Obesity     Past Surgical History:  Procedure Laterality Date   CHOLECYSTECTOMY N/A 09/12/2015   Procedure: LAPAROSCOPIC CHOLECYSTECTOMY WITH INTRAOPERATIVE CHOLANGIOGRAM;  Surgeon: Jackolyn Confer, MD;  Location: [facility];  Service: General;  Laterality: N/A;   COLONOSCOPY WITH PROPOFOL N/A 03/08/2016   Procedure: COLONOSCOPY WITH PROPOFOL;  Surgeon: Otis Brace, MD;  Location: [facility];  Service: Gastroenterology;  Laterality: N/A;   FOOT SURGERY      Prior to Admission medications   Medication Sig Start Date End Date Taking? Authorizing Provider  acetaminophen (TYLENOL) 650 MG CR tablet Take 650 mg by mouth every 8 (eight) hours as needed (arthritis).   Yes [provider]  ALPRAZolam (NIRAVAM) 0.25 MG dissolvable tablet Take 0.25 mg by mouth daily as needed for anxiety. 01/25/21  Yes [provider]  pantoprazole (PROTONIX) 40 MG tablet Take 1 tablet (40 mg total) by mouth daily. Patient taking differently: Take 40 mg by mouth daily as needed (heart burn). 11/09/18  Yes Plotnikov, Evie Lacks, MD   albuterol (VENTOLIN HFA) 108 (90 Base) MCG/ACT inhaler Inhale 2 puffs into the lungs every 6 (six) hours as needed for wheezing.    [provider]    Scheduled Meds: Continuous Infusions:  sodium chloride     lactated ringers     lactated ringers 10 mL/hr (04/03/21 0921)   PRN Meds:.lactated ringers  Allergies as of 01/29/2021 - Review Complete 01/16/2021  Allergen Reaction Noted   Methocarbamol  11/09/2018   Penicillins     Tramadol Other (See Comments) 01/31/2017    Family History  Problem Relation Age of Onset   Cancer Father        throat   Sleep apnea Brother    Breast cancer Maternal Aunt        ? age of onset   Diabetes Other    Cholelithiasis Other     Social History   Socioeconomic History   Marital status: Single    Spouse name: Not on file   Number of children: 1   Years of education: Not on file   Highest education level: Not on file  Occupational History    Employer: [employer]  Tobacco Use   Smoking status: Former   Smokeless tobacco: Never   Tobacco comments:    quit them "long time ago"  Vaping Use   Vaping Use: Never used  Substance and Sexual Activity   Alcohol use: No   Drug use: No   Sexual activity: Not on file  Other Topics Concern  Not on file  Social History Narrative   Daily caffeine use   Social Determinants of Health   Financial Resource Strain: Not on file  Food Insecurity: Not on file  Transportation Needs: Not on file  Physical Activity: Not on file  Stress: Not on file  Social Connections: Not on file  Intimate Partner Violence: Not on file      Physical Exam: Vital signs: Vitals:   04/03/21 0900  BP: 126/76  Pulse: 87  Resp: (!) 29  Temp: 98.6 F (37 C)  SpO2: 100%     General:   Morbid obesity Lungs:  Clear throughout to auscultation.   No wheezes, crackles, or rhonchi. No acute distress. Heart:  Regular rate and rhythm; no murmurs, clicks, rubs,  or gallops. Abdomen: Soft,  nontender, nondistended, bowel sounds present.  No peritoneal sign Rectal:  Deferred  GI:  Lab Results: No results for input(s): WBC, HGB, HCT, PLT in the last 72 hours. BMET No results for input(s): NA, K, CL, CO2, GLUCOSE, BUN, CREATININE, CALCIUM in the last 72 hours. LFT No results for input(s): PROT, ALBUMIN, AST, ALT, ALKPHOS, BILITOT, BILIDIR, IBILI in the last 72 hours. PT/INR No results for input(s): LABPROT, INR in the last 72 hours.   Studies/Results: No results found.  Impression/Plan: -Reflux -History of adenomatous polyp -Morbid obesity  Recommendations ----------------------------  - Proceed with EGD and colonoscopy today  Risks (bleeding, infection, bowel perforation that could require surgery, sedation-related changes in cardiopulmonary systems), benefits (identification and possible treatment of source of symptoms, exclusion of certain causes of symptoms), and alternatives (watchful waiting, radiographic imaging studies, empiric medical treatment)  were explained to patient/family in detail and patient wishes to proceed.     LOS: 0 days   Otis Brace  MD, FACP 04/03/2021, 9:52 AM  Contact #  [phone]

## 2021-04-03 NOTE — Transfer of Care (Signed)
Immediate Anesthesia Transfer of Care Note  Patient: Charlotte Meyer  Procedure(s) Performed: Procedure(s): COLONOSCOPY WITH PROPOFOL (N/A) ESOPHAGOGASTRODUODENOSCOPY (EGD) (N/A) BIOPSY POLYPECTOMY  Patient Location: PACU  Anesthesia Type:MAC  Level of Consciousness: Patient easily awoken, sedated, comfortable, cooperative, following commands, responds to stimulation.   Airway & Oxygen Therapy: Patient spontaneously breathing, ventilating well, oxygen via simple oxygen mask.  Post-op Assessment: Report given to PACU RN, vital signs reviewed and stable, moving all extremities.   Post vital signs: Reviewed and stable.  Complications: No apparent anesthesia complications  Last Vitals:  Vitals Value Taken Time  BP 159/112 04/03/21 1046  Temp    Pulse 86 04/03/21 1048  Resp 16 04/03/21 1048  SpO2 100 % 04/03/21 1048  Vitals shown include unvalidated device data.  Last Pain:  Vitals:   04/03/21 1046  TempSrc:   PainSc: 0-No pain         Complications: No notable events documented.

## 2021-04-03 NOTE — Op Note (Signed)
[facility] Patient Name: Charlotte Meyer Procedure Date: 04/03/2021 MRN: [id number] Attending MD: Otis Brace , MD Date of Birth: [birth year] CSN: [id number] Age: 55 Admit Type: Outpatient Procedure:                Upper GI endoscopy Indications:              Heartburn, Exclusion of Helicobacter pylori Providers:                Otis Brace, MD, Dulcy Fanny, Luan Moore, Technician, Heide Scales, CRNA Referring MD:              Medicines:                Sedation Administered by an Anesthesia Professional Complications:            No immediate complications. Estimated Blood Loss:     Estimated blood loss was minimal. Procedure:                Pre-Anesthesia Assessment:                           - Prior to the procedure, a History and Physical                            was performed, and patient medications and                            allergies were reviewed. The patient's tolerance of                            previous anesthesia was also reviewed. The risks                            and benefits of the procedure and the sedation                            options and risks were discussed with the patient.                            All questions were answered, and informed consent                            was obtained. Prior Anticoagulants: The patient has                            taken no previous anticoagulant or antiplatelet                            agents. ASA Grade Assessment: III - A patient with                            severe systemic disease. After reviewing the risks  and benefits, the patient was deemed in                            satisfactory condition to undergo the procedure.                           After obtaining informed consent, the endoscope was                            passed under direct vision. Throughout the                            procedure, the  patient's blood pressure, pulse, and                            oxygen saturations were monitored continuously. The                            [id number]-H190 ([id number]) Olympus endoscope was introduced                            through the mouth, and advanced to the second part                            of duodenum. The upper GI endoscopy was                            accomplished without difficulty. The patient                            tolerated the procedure well. Scope In: Scope Out: Findings:      The Z-line was variable and was found 39 cm from the incisors. Biopsies       were taken with a cold forceps for histology.      Scattered mild inflammation characterized by congestion (edema) and       erythema was found in the gastric body and in the gastric antrum.       Biopsies were taken with a cold forceps for histology.      The cardia and gastric fundus were normal on retroflexion.      The duodenal bulb, first portion of the duodenum and second portion of       the duodenum were normal. Impression:               - Z-line variable, 39 cm from the incisors.                            Biopsied.                           - Chronic gastritis. Biopsied.                           - Normal duodenal bulb, first portion of the  duodenum and second portion of the duodenum. Moderate Sedation:      Moderate (conscious) sedation was personally administered by an       anesthesia professional. The following parameters were monitored: oxygen       saturation, heart rate, blood pressure, and response to care. Recommendation:           - Patient has a contact number available for                            emergencies. The signs and symptoms of potential                            delayed complications were discussed with the                            patient. Return to normal activities tomorrow.                            Written discharge instructions were provided to  the                            patient.                           - Resume previous diet.                           - Continue present medications.                           - Await pathology results.                           - Perform a colonoscopy today. Procedure Code(s):        --- Professional ---                           [phone], Esophagogastroduodenoscopy, flexible,                            transoral; with biopsy, single or multiple Diagnosis Code(s):        --- Professional ---                           K22.8, Other specified diseases of esophagus                           K29.50, Unspecified chronic gastritis without                            bleeding                           R12, Heartburn CPT copyright 2019 American Medical Association. All rights reserved. The codes documented in this report are preliminary and upon coder review may  be revised to meet current compliance requirements. Otis Brace, MD Otis Brace, MD 04/03/2021 10:44:49 AM Number of  Addenda: 0

## 2021-04-03 NOTE — Anesthesia Procedure Notes (Signed)
Procedure Name: MAC Date/Time: 04/03/2021 10:08 AM Performed by: Deliah Boston, CRNA Pre-anesthesia Checklist: Patient identified, Emergency Drugs available, Suction available and Patient being monitored Patient Re-evaluated:Patient Re-evaluated prior to induction Oxygen Delivery Method: Simple face mask Placement Confirmation: positive ETCO2 and breath sounds checked- equal and bilateral

## 2021-04-04 ENCOUNTER — Encounter (HOSPITAL_COMMUNITY): Payer: Self-pay | Admitting: Gastroenterology

## 2021-04-04 LAB — SURGICAL PATHOLOGY

## 2021-04-05 ENCOUNTER — Other Ambulatory Visit: Payer: Self-pay

## 2021-04-05 ENCOUNTER — Encounter: Payer: Federal, State, Local not specified - PPO | Attending: Surgery | Admitting: Skilled Nursing Facility1

## 2021-04-05 DIAGNOSIS — Z6841 Body Mass Index (BMI) 40.0 and over, adult: Secondary | ICD-10-CM | POA: Diagnosis present

## 2021-04-05 NOTE — Progress Notes (Signed)
Supervised Weight Loss Visit Bariatric Nutrition Education  Planned Surgery: sleeve gastrectomy   3 out of 3 SWL Appointments    Pt completed visits.   Pt has cleared nutrition requirements.     NUTRITION ASSESSMENT  Anthropometrics  Start weight at [facility]: 334.3 lbs (date: 12/27/2020)  Weight: 332.8 pounds BMI:  54.54 kg/m2     Clinical  Medical hx: reflux Medications: see list  Labs: LDL 142, vitamin D 19 Notable signs/symptoms: knee pain  Any previous deficiencies? Yes: vitamin D, iron   Lifestyle & Dietary Hx  Pt states she feels she has learned what she needs to eat and what she needs not to eat, the importance of walking and needing to increase that, and what are gas producing foods for her (normal gas production realizing it is just embarrassing but normal).    Pt states she recognizes she will have to be really intentional with needing to be physical activity.   Pt states she recognizes she needs to get in more vegetables throughout the day and week. Pt states she recognizes she needs to walk more so has been trying to be more active in her usual routine and day.   Estimated daily fluid intake: 80 oz Supplements:  Current average weekly physical activity: ADL's  24-Hr Dietary Recall First Meal: egg + peppers + tomato or egg + bacon Snack: jello and protein shake Second Meal: chicken + corn + beets Snack: green beans + corn  Third Meal: chicken or fish Snack:  Beverages: water  Estimated Energy Needs Calories: 1500   NUTRITION DIAGNOSIS  Overweight/obesity ([place]-3.3) related to past poor dietary habits and physical inactivity as evidenced by patient w/ planned sleeve gastrectomy surgery following dietary guidelines for continued weight loss.   NUTRITION INTERVENTION  Nutrition counseling (C-1) and education (E-2) to facilitate bariatric surgery goals.  Pre-Op Goals Progress & New Goals Continue: Avoid concentrated sugars and fried foods Continue: Avoid  all sugar-sweetened beverages (ex: regular soda, sports drinks)  continue: log everything you eat and drink: take notes in your phone continue: once a week walk 10 minutes  continue: try some protein shakes to avoid getting gas Continue: have a protein with dinner  Handouts Provided Include  N/A  Learning Style & Readiness for Change Teaching method utilized: Visual & Auditory  Demonstrated degree of understanding via: Teach Back  Readiness Level: contemplative  Barriers to learning/adherence to lifestyle change: unknown at this time    MONITORING & EVALUATION Dietary intake, weekly physical activity, body weight, and pre-op goals  Next Steps  Patient is to return to [facility] for pre-op class  Pt has completed visits. No further supervised visits required/recomended

## 2021-05-08 ENCOUNTER — Ambulatory Visit: Payer: Federal, State, Local not specified - PPO | Admitting: Neurology

## 2021-06-14 ENCOUNTER — Ambulatory Visit (INDEPENDENT_AMBULATORY_CARE_PROVIDER_SITE_OTHER): Payer: Federal, State, Local not specified - PPO | Admitting: Psychology

## 2021-06-14 DIAGNOSIS — F509 Eating disorder, unspecified: Secondary | ICD-10-CM | POA: Diagnosis not present

## 2021-06-14 NOTE — Progress Notes (Signed)
Bariatric Evaluation    CONFIDENTIAL    Client Name: Meekah Math                                                                                                                                                                       MRN:  [id number] Date of Birth:  1965/06/28                                                                                                                                                                                               Date of Evaluation: 12/04/2020 Total Assessment Time: 11 Minutes                                                                                                                                                                                         Date of Report: 12/04/2020 Evaluator: Buena Irish, MSW, LCSW  Date of Follow-up:  12/25/2020 Referring Physician: Dr. Thermon Leyland,                                                                                                                                                                                     Date of 2nd Follow-up: 06/14/2021 [facility]   Sources of Information Background Interview Bariatric Questionnaire [place] Interview for Gastric Bypass Beck Anxiety Inventory (BAI) Beck Depression Inventory, 2nd Edition (BDI-II) Eating Disorder Diagnostic Scale (EDDS) Mental Status Examination Mood Disorder Questionnaire (MDQ) Review of Medical Record (provided by [facility])  Patient Identification and Chief Complaint: Aaria was born and raised in [place], [place] and currently lives in [place], [place]. Lajoya has one daughter (15). Jamaica's family is generally supportive of her pursuit of surgery. There is a family history of hypertension & diabetes type-2. Kamorah is unaware of any mental health  concerns for the family.   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 Essa has a BA in health care administration.   Omer has a history GERD, arthritis & an umbilical hernia, with the latter requiring surgery. She is currently taking Pantoprazole 33m. Joslyne takes Alprazolam .5 mg prn for anxiety and a history of depression and anxiety, mild, by report.    Patient's Understanding of the Procedure: Lidya noted her interest in the gastric bypass but noted that her surgeon's recommendation of the gastric sleeve. Sharaya could not provide a description of the surgical process and would benefit from an in-depth review including risks of surgery and the less common side-effects. Jenipher could not provide a thorough description of the post-surgical dietary regimen, and its varying stages, but has yet to meet the registered dietician. A review of this will be recommended, as well. She discussed her motivation for surgery including improving her health, fitness, and mobility.  History of Weight Gains and Losses: Current weight, height, desired weight:   5'5, 329lbs, ~180lbs.  Rate current activity level from 1 (sedentary) to 10 (very active): 5 Methods of exercise: Walk (1 mile 2x per week) Eating Disorder History (binging, purging, laxatives): Rosalee denied any concerns.  History of dieting and results:  ~2021: Low-Carb, 10-15lbs, maintained for 5 months.  ~2019: Low-Carb, no weight-loss.  ~2018: Phentermine, lost 3lbs, maintained for 6 months.  ~2015: Nutrisystem, lost 10-15lbs, maintained for 12 months.  ~2014: Low Carb, lost 50lbs, maintained for 12 months.   Diet and Plans for the Future: Current diet/typical meals:   Breakfast 2 or 3 eggs scrambled soft 2-3 pieces of bacon  Lunch Salad with baked chicken or 2 or 3 fried chicken wings corn on cob  Dinner Fried fish & cabbage  Snacks  Lays Potato chips, Jell-O, Fruit, or popcorn.    Plans for exercise post-surgery: Working  out at gym.  How pt copes with stress: Take time alone, process frustration.   Other planned changes for the future: Improve health and fitness.  Mental Status Examination: Nafisah Runions was early to the session and completed all the necessary paperwork. She was dressed casually and her hygiene was observed to be good. She was oriented to person, place, and time. Her attitude was cooperative and cheerful. There were no unusual psychomotor movements or changes. Speech patterns were normal in rate, tone, volume, and without pressure. Affect was reactive and mood congruent. Thought processes were goal-directed and logical. Memory and concentration for both long and short-term were intact. Insight and judgement were good overall.   Intellectual and Psychological Functioning Ms. Latrisa Gilleland very warm and friendly. She presented with no significant formal thought disorder. Prior to the session, Ms. Amariz Flamenco completed the Mood Disorder Questionnaire - with no affirmatives for symptoms of Bipolar. Her depression screening, Beck's Depression Index netted a score of      13/63, which is indicative of mild mood disturbance (depression) symptomatology, Beck Anxiety Inventory netted a score 7/63 which is indicative of minimal anxiety symptomatology, and Eating Disorder Diagnostic Scale - of which Aylee Littrell does not meet  criteria for. Additionally, Tura Rolena Infante completed The [place] Interview for Gastric Bypass during the session which did not identify any symptoms or behaviors of possible disordered eating.  Overall, the results of the aforementioned screenings were unremarkable.   Conclusions & Recommendations Ms. Eisa Conaway is a 56 year-old [place] American female who was referred to the [place] division by Dr. Thermon Leyland at [facility] for a psychological evaluation to determine her suitability for bariatric surgery.   With regard to bariatric surgery,  Ms. Aniza Shor appears to be highly motivated and has a solid understanding of the bariatric surgery, as well as risks and lifestyle changes needed to promote post-surgical weight loss success. Results of this evaluation yielded a past history of mild depression and anxiety, by report. At this time, Ms. Hasina Kreager appears to be able to make an informed decision about the surgery she is contemplating. The patient appears to be motivated and expressed understanding of the post-surgical requirements. If her surgery is scheduled more than 3 months from today's date (12/25/2020) she is required to schedule a follow-up visit in order for this examiner to briefly re-evaluate her psychological status at that time. This follow-up visit should occur within three months of her scheduled surgery date.  The following recommendations are offered in order to promote Ms. Zalia Hautala' health and well-being:  It is recommended that the patient participate in educational sessions regarding a healthy diet and post-operative meal planning with a dietician or other health care providers.   Re-evaluation. If Ms. Tekesha Almgren' surgery is scheduled more than three months from her date of approval, she is required to contact our office [phone] for a brief check-in appointment about a month prior to her surgery date.   Nutritional Counseling. Ms. Seren Chaloux is strongly encouraged to continue attending nutritional counseling appointments in order to plan a healthy diet and post-operative meals. It should be noted that Ms. Sunshyne Horvath' daily diet includes foods primarily low in refined carbs and higher in protein. she is encouraged to make recommended changes to her diet prior to surgery in order to increase the chances of continuing a healthy diet after surgery.  Exercise. Ms. Ellan Tess is encouraged to participate in educational sessions on exercise that will be appropriate for her medical conditions  and support her weight loss plans in a safe and healthy manner. Specifically, she is encouraged to consider participating in the Bariatric Exercise and Lifestyle Transformation (BELT) program, a partnership between [facility]. There, you'll join fellow [facility] bariatric patients three times a week for personalized aerobic and strength training instruction, as well as educational sessions on diet, exercise and behavior modification strategies. BELT meets at [place] at [employer].   Support groups. The patient is encouraged to join a support group to give him/her encouragement as s/he faces the psychological adjustments of bariatric surgery and the need to significantly adjust one's meals and food choices. A list of support groups offered through [facility] can be found through the bariatrics department website: [facility]  Self-help resources.  To develop strategies for managing emotional difficulties encountered before and after weight loss surgery, the patient is encouraged to read The Emotional First + Aid Kit: A Practical Guide to Life After Bariatric Surgery, Second Edition by Caren Griffins L. Sheppard Coil, PsyD. Examples of strategies discussed in this book include relieving stress without using food, developing and maintaining an exercise program, preventing relapse, etc. Ms. Emonni Depasquale is strongly encouraged to practice mindful eating, the goal of which is to pay close attention to the smell, sight, taste, temperature, texture, etc. of food. Eating mindfully helps to eat slower while enjoying food more fully. Useful books on mindful eating include Savor: Mindful Eating, Mindful Life and How to Eat, both by mindfulness expert Thich Nhat Hanh.  Mental health treatment. For additional support either prior to or after the surgery, Ms. Makaia Rappa may consider seeking the  care of a therapist (counselor, psychologist) in order to develop skills for coping with the adjustment to a new lifestyle. Available therapists include this examiner, as well as other clinicians within the [facility] [phone]). she may also seek other in-network providers in the community by searching online at [employer]Spr[employer].   General recommendations for bariatric surgery: Replace the habit of late night snacking with something else (e.g., chewing gum, drinking water, or a relaxing activity like reading or crosswords that occupies your hands) and consider going to bed earlier.  Practice eating 4-6 meals per day. Each meal should last about 20 minutes. Practice drinking liquids 30 minutes before or after meals. Keep a food diary for 1 week. Record all foods eaten during the day, including snacks and drinks. Be very specific and very honest.  Get into the habit of reading food labels to evaluate content of protein, sugars, carbohydrates, sodium, etc.  Continue to eat lots of vegetables.  Prepare meals at home, rather than take-out or fast food.  Take multivitamins including zinc and iron.  Develop exercise plans, including a low-impact and safe exercise plan to start 4-5 weeks into recovery, and a more intensive exercise plan for later.  Determine who will take care of any major responsibilities (particularly those involving physical activity, such as childcare) in the early stages of your recovery.  Educate family and friends who will be involved in your recovery about the extent and importance of your new lifestyle changes. The more they know, the better they can support you and help you stay on track!  12/25/2020  __________________________________    ______________________ Buena Irish, MSW, LCSW      Date Licensed Clinical Social Worker [facility] [phone]    06/14/2021   Lylian arrived on time for her session appearing neatly groomed. She was pleasant and cooperative and easily engaged. She shared that she had completed all the necessary steps for surgery but due to the elapsed time would need a follow-up, with evaluator, to update the initial assessment.    Elayna discussed behavioral changes regarding good including being more mindful and making healthful changes to her overall diet. These behavior changes include discontinuing the consumption of fried foods. She noted increasing lean protein and decreasing carbohydrate intake.  Riham noted physically better and denied any changes in health status, medication, and denied any negative shifts in her overall mood and functioning. She continues to be prescribed Alprazolam prn but denied taking it in the past two months.   Ms. Carolynn Tuley completed the Mood Disorder Questionnaire - and does not meet criteria for bipolar. Her depression screening, Beck's Depression Index netted a score of 2/63, which is indicative of normal life ups and downs. Beck Anxiety Inventory netted a score 2/63 which is indicative of minimal anxiety symptomology, and Eating Disorder Diagnostic Scale - of which Malary Aylesworth does not meet criteria for. Therapist provided feedback regarding previous measures and newest iteration of measures during the session. Overall, the results of the aforementioned screenings were unremarkable.     We completed the mini-mental status examination during this session. Tamatha presents as alert and oriented to person, place, time, and self. There is no indication of mood or thought disorder. No indication of delusions or hallucinations. Domino denies any suicidal or homicidal ideation, she  denies depressive or anxiety symptoms. She has no history substance issues. Prognosis for successful surgery remains good.                                             06-14-21 __________________________________    ____________________ Buena Irish, MSW, LCSW      Date Licensed Clinical Social Worker [facility] [phone]

## 2021-06-14 NOTE — Progress Notes (Signed)
Time Spent: 1:35  pm - 2:15 pm : 40 Minutes  Charlotte Meyer participated in the session via  video from car and consented to treatment. We met online due to COVID-19 pandemic. Therapist participated from home office.   The updated measures were reviewed prior to the assessment (10 minutes).   We completed the feedback session which includes reviewing the following measures: Beck Anxiety Inventory (BAI), Beck's Depression Index, Eating Disorder Diagnostic Scale (EDDS), Mental Status Examination (MSE) (10 minutes [administration and scoring]), & Mood Disorder Questionnaire (MDQ). The current measures were compared to the previous iteration of the measures, which was also reviewed during this feedback session. Charlotte Meyer provided an update since our last follow-up. An addendum was written (20 minutes) to integrate the results of the new measures, which included the update provided by the patient. The addendum was signed and submitted to the surgeon for review. Charlotte Meyer is aware and in agreement.  The interactive feedback session was completed today and a total of 40 minutes was spent on feedback alone. Code 96759 was billed for feedback session.    Charlotte Meyer is approved for surgery.  Diagnosis code: e66.01

## 2021-07-25 ENCOUNTER — Ambulatory Visit: Payer: Federal, State, Local not specified - PPO | Admitting: Skilled Nursing Facility1

## 2021-07-25 ENCOUNTER — Encounter: Payer: Federal, State, Local not specified - PPO | Attending: Surgery | Admitting: Skilled Nursing Facility1

## 2021-07-25 DIAGNOSIS — Z713 Dietary counseling and surveillance: Secondary | ICD-10-CM | POA: Insufficient documentation

## 2021-07-25 DIAGNOSIS — Z6841 Body Mass Index (BMI) 40.0 and over, adult: Secondary | ICD-10-CM | POA: Diagnosis not present

## 2021-07-25 NOTE — Progress Notes (Signed)
Supervised Weight Loss Visit Bariatric Nutrition Education  Planned Surgery: RYGB  4 out of 3 SWL Appointments: insurance mandates an appt closer to the surgery date   Pt completed visits.   Pt has cleared nutrition requirements.    NUTRITION ASSESSMENT  Anthropometrics  Start weight at [facility]: 334.3 lbs (date: 12/27/2020)  Weight: virtual appt; pt identified by name and DOB pt agreeable to limitations of this visit type  BMI:    Clinical  Medical hx: reflux Medications: see list  Labs: LDL 142, vitamin D 19 Notable signs/symptoms: knee pain  Any previous deficiencies? Yes: vitamin D, iron   Lifestyle & Dietary Hx  Pt states she has been able to continue with her changes. Pt states the premier protein shakes give her gas: dietitian advised pt to try plant based.  Pt states she tries to do some leg lifts while in her  chair at work.   Pt states her surgery has been changed to the RYGB.   Estimated daily fluid intake: 80 oz Supplements:  Current average weekly physical activity: ADL's  24-Hr Dietary Recall First Meal: egg + peppers + tomato or egg + bacon or eggs + toast Snack: jello and protein shake Second Meal: chicken + corn + beets or broccoli + chicken Snack: green beans + corn  Third Meal: chicken or fish + corn Snack:  Beverages: water, water + flavoring, lemonade with fake sugar  Estimated Energy Needs Calories: 1500   NUTRITION DIAGNOSIS  Overweight/obesity ([place]-3.3) related to past poor dietary habits and physical inactivity as evidenced by patient w/ planned sleeve gastrectomy surgery following dietary guidelines for continued weight loss.   NUTRITION INTERVENTION  Nutrition counseling (C-1) and education (E-2) to facilitate bariatric surgery goals.  Pre-Op Goals Progress & New Goals Continue: Avoid concentrated sugars and fried foods Continue: Avoid all sugar-sweetened beverages (ex: regular soda, sports drinks)  continue: log everything you eat  and drink: take notes in your phone continue: once a week walk 10 minutes  continue: try some protein shakes to avoid getting gas Continue: have a protein with dinner  Handouts Provided Include  N/A  Learning Style & Readiness for Change Teaching method utilized: Visual & Auditory  Demonstrated degree of understanding via: Teach Back  Readiness Level: contemplative  Barriers to learning/adherence to lifestyle change: unknown at this time    MONITORING & EVALUATION Dietary intake, weekly physical activity, body weight, and pre-op goals  Next Steps  Patient is to return to [facility] for pre-op class  Pt has completed visits. No further supervised visits required/recomended

## 2021-07-26 ENCOUNTER — Ambulatory Visit: Payer: Federal, State, Local not specified - PPO | Admitting: Skilled Nursing Facility1

## 2021-07-30 ENCOUNTER — Other Ambulatory Visit: Payer: Self-pay

## 2021-07-30 ENCOUNTER — Encounter: Payer: Federal, State, Local not specified - PPO | Admitting: Skilled Nursing Facility1

## 2021-07-30 NOTE — Progress Notes (Signed)
Pre-Operative Nutrition Class:    Patient was seen on 07/30/2021 for Pre-Operative Bariatric Surgery Education at [facility]    Surgery date:  Surgery type: RYGB Start weight at [facility]: 334.3 Weight today: 325.1  Samples given per MNT protocol. Patient educated on appropriate usage: Bariatric Advantage Multivitamin Lot # [id number] Exp:10/23  Ensure max  Shake Lot # [id number] Exp: 09/08/2021   The following the learning objectives were met by the patient during this course: Identify Pre-Op Dietary Goals and will begin 2 weeks pre-operatively Identify appropriate sources of fluids and proteins  State protein recommendations and appropriate sources pre and post-operatively Identify Post-Operative Dietary Goals and will follow for 2 weeks post-operatively Identify appropriate multivitamin and calcium sources Describe the need for physical activity post-operatively and will follow MD recommendations State when to call healthcare provider regarding medication questions or post-operative complications When having a diagnosis of diabetes understanding hypoglycemia symptoms and the inclusion of 1 complex carbohydrate per meal  Handouts given during class include: Pre-Op Bariatric Surgery Diet Handout Protein Shake Handout Post-Op Bariatric Surgery Nutrition Handout BELT Program Information Flyer Support Group Information Flyer [facility] Bariatric Supplements Price List  Follow-Up Plan: Patient will follow-up at [facility] 2 weeks post operatively for diet advancement per MD.

## 2021-08-10 ENCOUNTER — Ambulatory Visit: Payer: Self-pay | Admitting: Surgery

## 2021-08-10 DIAGNOSIS — Z01818 Encounter for other preprocedural examination: Secondary | ICD-10-CM

## 2021-08-10 NOTE — Progress Notes (Signed)
Sent message, via epic in basket, requesting orders in epic from surgeon.  

## 2021-08-14 NOTE — Patient Instructions (Addendum)
DUE TO COVID-19 ONLY ONE VISITOR  (aged 56 and older)  IS ALLOWED TO COME WITH YOU AND STAY IN THE WAITING ROOM ONLY DURING PRE OP AND PROCEDURE.   **NO VISITORS ARE ALLOWED IN THE SHORT STAY AREA OR RECOVERY ROOM!!**  IF YOU WILL BE ADMITTED INTO THE HOSPITAL YOU ARE ALLOWED ONLY TWO SUPPORT PEOPLE DURING VISITATION HOURS ONLY (7 AM -8PM)   The support person(s) must pass our screening, gel in and out, and wear a mask at all times, including in the patients room. Patients must also wear a mask when staff or their support person are in the room. Visitors GUEST BADGE MUST BE WORN VISIBLY  One adult visitor may remain with you overnight and MUST be in the room by 8 P.M.       Your procedure is scheduled on: 08/21/21   Report to [facility] Main Entrance    Report to admitting at 5:15 AM   Call this number if you have problems the morning of surgery [phone]   MORNING OF SURGERY DRINK:   DRINK 1 G2 drink BEFORE YOU LEAVE HOME, DRINK ALL OF THE  G2 DRINK AT ONE TIME.   NO SOLID FOOD AFTER 600 PM THE NIGHT BEFORE YOUR SURGERY. YOU MAY DRINK CLEAR FLUIDS. THE G2 DRINK YOU DRINK BEFORE YOU LEAVE HOME WILL BE THE LAST FLUIDS YOU DRINK BEFORE SURGERY.  PAIN IS EXPECTED AFTER SURGERY AND WILL NOT BE COMPLETELY ELIMINATED. AMBULATION AND TYLENOL WILL HELP REDUCE INCISIONAL AND GAS PAIN. MOVEMENT IS KEY!  YOU ARE EXPECTED TO BE OUT OF BED WITHIN 4 HOURS OF ADMISSION TO YOUR PATIENT ROOM.  SITTING IN THE RECLINER THROUGHOUT THE DAY IS IMPORTANT FOR DRINKING FLUIDS AND MOVING GAS THROUGHOUT THE GI TRACT.  COMPRESSION STOCKINGS SHOULD BE WORN [place] UNLESS YOU ARE WALKING.   INCENTIVE SPIROMETER SHOULD BE USED EVERY HOUR WHILE AWAKE TO DECREASE POST-OPERATIVE COMPLICATIONS SUCH AS PNEUMONIA.  WHEN DISCHARGED HOME, IT IS IMPORTANT TO CONTINUE TO WALK EVERY HOUR AND USE THE INCENTIVE SPIROMETER EVERY HOUR.    After Midnight you may have the following liquids until  4:30 AM DAY OF SURGERY  Water Black Coffee (sugar ok, NO MILK/CREAM OR CREAMERS)  Tea (sugar ok, NO MILK/CREAM OR CREAMERS) regular and decaf                             Plain Jell-O (NO RED)                                           Fruit ices (not with fruit pulp, NO RED)                                     Popsicles (NO RED)                                                                  Juice: apple, WHITE grape, WHITE cranberry Sports drinks like Gatorade (NO RED) Clear broth(vegetable,chicken,beef)     The day of  surgery:  Drink ONE (1) Pre-Surgery G2 at 4:30 AM the morning of surgery. Drink in one sitting. Do not sip.  This drink was given to you during your hospital  pre-op appointment visit. Nothing else to drink after completing the  Pre-Surgery G2.          If you have questions, please contact your surgeons office.   FOLLOW BOWEL PREP AND ANY ADDITIONAL PRE OP INSTRUCTIONS YOU RECEIVED FROM YOUR SURGEON'S OFFICE!!!     Oral Hygiene is also important to reduce your risk of infection.                                    Remember - BRUSH YOUR TEETH THE MORNING OF SURGERY WITH YOUR REGULAR TOOTHPASTE   Take these medicines the morning of surgery with A SIP OF WATER: Tylenol, Inhalers, Alprazolam, Pantoprazole   Bring CPAP mask and tubing day of surgery.                              You may not have any metal on your body including hair pins, jewelry, and body piercing             Do not wear make-up, lotions, powders, perfumes, or deodorant  Do not wear nail polish including gel and S&S, artificial/acrylic nails, or any other type of covering on natural nails including finger and toenails. If you have artificial nails, gel coating, etc. that needs to be removed by a nail salon please have this removed prior to surgery or surgery may need to be canceled/ delayed if the surgeon/ anesthesia feels like they are unable to be safely monitored.   Do not shave  48 hours prior  to surgery.    Do not bring valuables to the hospital. [place].   Bring small overnight bag day of surgery.              Please read over the following fact sheets you were given: IF YOU HAVE QUESTIONS ABOUT YOUR PRE-OP INSTRUCTIONS PLEASE CALL [place] - Preparing for Surgery Before surgery, you can play an important role.  Because skin is not sterile, your skin needs to be as free of germs as possible.  You can reduce the number of germs on your skin by washing with CHG (chlorahexidine gluconate) soap before surgery.  CHG is an antiseptic cleaner which kills germs and bonds with the skin to continue killing germs even after washing. Please DO NOT use if you have an allergy to CHG or antibacterial soaps.  If your skin becomes reddened/irritated stop using the CHG and inform your nurse when you arrive at Short Stay. Do not shave (including legs and underarms) for at least 48 hours prior to the first CHG shower.  You may shave your face/neck.  Please follow these instructions carefully:  1.  Shower with CHG Soap the night before surgery and the  morning of surgery.  2.  If you choose to wash your hair, wash your hair first as usual with your normal  shampoo.  3.  After you shampoo, rinse your hair and body thoroughly to remove the shampoo.  4.  Use CHG as you would any other liquid soap.  You can apply chg directly to the skin and wash.  Gently with a scrungie or clean washcloth.  5.  Apply the CHG Soap to your body ONLY FROM THE NECK DOWN.   Do   not use on face/ open                           Wound or open sores. Avoid contact with eyes, ears mouth and   genitals (private parts).                       Wash face,  Genitals (private parts) with your normal soap.             6.  Wash thoroughly, paying special attention to the area where your    surgery  will be performed.  7.  Thoroughly  rinse your body with warm water from the neck down.  8.  DO NOT shower/wash with your normal soap after using and rinsing off the CHG Soap.                9.  Pat yourself dry with a clean towel.            10.  Wear clean pajamas.            11.  Place clean sheets on your bed the night of your first shower and do not  sleep with pets. Day of Surgery : Do not apply any lotions/deodorants the morning of surgery.  Please wear clean clothes to the hospital/surgery center.  FAILURE TO FOLLOW THESE INSTRUCTIONS MAY RESULT IN THE CANCELLATION OF YOUR SURGERY  PATIENT SIGNATURE_________________________________  NURSE SIGNATURE__________________________________  ________________________________________________________________________   Charlotte Meyer  An incentive spirometer is a tool that can help keep your lungs clear and active. This tool measures how well you are filling your lungs with each breath. Taking long deep breaths may help reverse or decrease the chance of developing breathing (pulmonary) problems (especially infection) following: A long period of time when you are unable to move or be active. BEFORE THE PROCEDURE  If the spirometer includes an indicator to show your best effort, your nurse or respiratory therapist will set it to a desired goal. If possible, sit up straight or lean slightly forward. Try not to slouch. Hold the incentive spirometer in an upright position. INSTRUCTIONS FOR USE  Sit on the edge of your bed if possible, or sit up as far as you can in bed or on a chair. Hold the incentive spirometer in an upright position. Breathe out normally. Place the mouthpiece in your mouth and seal your lips tightly around it. Breathe in slowly and as deeply as possible, raising the piston or the ball toward the top of the column. Hold your breath for 3-5 seconds or for as long as possible. Allow the piston or ball to fall to the bottom of the column. Remove the  mouthpiece from your mouth and breathe out normally. Rest for a few seconds and repeat Steps 1 through 7 at least 10 times every 1-2 hours when you are awake. Take your time and take a few normal breaths between deep breaths. The spirometer may include an indicator to show your best effort. Use the indicator as a goal to work toward during each repetition. After each set of 10 deep breaths, practice coughing to be sure  your lungs are clear. If you have an incision (the cut made at the time of surgery), support your incision when coughing by placing a pillow or rolled up towels firmly against it. Once you are able to get out of bed, walk around indoors and cough well. You may stop using the incentive spirometer when instructed by your caregiver.  RISKS AND COMPLICATIONS Take your time so you do not get dizzy or light-headed. If you are in pain, you may need to take or ask for pain medication before doing incentive spirometry. It is harder to take a deep breath if you are having pain. AFTER USE Rest and breathe slowly and easily. It can be helpful to keep track of a log of your progress. Your caregiver can provide you with a simple table to help with this. If you are using the spirometer at home, follow these instructions: [place] IF:  You are having difficultly using the spirometer. You have trouble using the spirometer as often as instructed. Your pain medication is not giving enough relief while using the spirometer. You develop fever of 100.5 F (38.1 C) or higher. SEEK IMMEDIATE MEDICAL CARE IF:  You cough up bloody sputum that had not been present before. You develop fever of 102 F (38.9 C) or greater. You develop worsening pain at or near the incision site. MAKE SURE YOU:  Understand these instructions. Will watch your condition. Will get help right away if you are not doing well or get worse. Document Released: 10/07/2006 Document Revised: 08/19/2011 Document Reviewed:  12/08/2006 Exit[employer] Patient Information 2014 ExitCare, [place].   ________________________________________________________________________    WHAT IS A BLOOD TRANSFUSION? Blood Transfusion Information  A transfusion is the replacement of blood or some of its parts. Blood is made up of multiple cells which provide different functions. Red blood cells carry oxygen and are used for blood loss replacement. White blood cells fight against infection. Platelets control bleeding. Plasma helps clot blood. Other blood products are available for specialized needs, such as hemophilia or other clotting disorders. BEFORE THE TRANSFUSION  Who gives blood for transfusions?  Healthy volunteers who are fully evaluated to make sure their blood is safe. This is blood bank blood. Transfusion therapy is the safest it has ever been in the practice of medicine. Before blood is taken from a donor, a complete history is taken to make sure that person has no history of diseases nor engages in risky social behavior (examples are intravenous drug use or sexual activity with multiple partners). The donor's travel history is screened to minimize risk of transmitting infections, such as malaria. The donated blood is tested for signs of infectious diseases, such as HIV and hepatitis. The blood is then tested to be sure it is compatible with you in order to minimize the chance of a transfusion reaction. If you or a relative donates blood, this is often done in anticipation of surgery and is not appropriate for emergency situations. It takes many days to process the donated blood. RISKS AND COMPLICATIONS Although transfusion therapy is very safe and saves many lives, the main dangers of transfusion include:  Getting an infectious disease. Developing a transfusion reaction. This is an allergic reaction to something in the blood you were given. Every precaution is taken to prevent this. The decision to have a blood transfusion has  been considered carefully by your caregiver before blood is given. Blood is not given unless the benefits outweigh the risks. AFTER THE TRANSFUSION Right after receiving  a blood transfusion, you will usually feel much better and more energetic. This is especially true if your red blood cells have gotten low (anemic). The transfusion raises the level of the red blood cells which carry oxygen, and this usually causes an energy increase. The nurse administering the transfusion will monitor you carefully for complications. HOME CARE INSTRUCTIONS  No special instructions are needed after a transfusion. You may find your energy is better. Speak with your caregiver about any limitations on activity for underlying diseases you may have. SEEK MEDICAL CARE IF:  Your condition is not improving after your transfusion. You develop redness or irritation at the intravenous (IV) site. SEEK IMMEDIATE MEDICAL CARE IF:  Any of the following symptoms occur over the next 12 hours: Shaking chills. You have a temperature by mouth above 102 F (38.9 C), not controlled by medicine. Chest, back, or muscle pain. People around you feel you are not acting correctly or are confused. Shortness of breath or difficulty breathing. Dizziness and fainting. You get a rash or develop hives. You have a decrease in urine output. Your urine turns a dark color or changes to pink, red, or brown. Any of the following symptoms occur over the next 10 days: You have a temperature by mouth above 102 F (38.9 C), not controlled by medicine. Shortness of breath. Weakness after normal activity. The white part of the eye turns yellow (jaundice). You have a decrease in the amount of urine or are urinating less often. Your urine turns a dark color or changes to pink, red, or brown. Document Released: 05/24/2000 Document Revised: 08/19/2011 Document Reviewed: 01/11/2008 [facility] Patient Information 2014 [place],  [place].  _______________________________________________________________________

## 2021-08-14 NOTE — Progress Notes (Addendum)
COVID swab appointment: Patient is not able to come in for a COVID test 08/17/21 or 08/20/21. Per Caryl Pina at [facility], unable to move surgery time back to accommodate for COVID test morning of surgery. Left patient a voicemail to call back to see if she will be able to get one with PCP. UPDATE: patient is able to get PCR COVID test 08/17/21 and will fax and email results. Update: PCR COVID test done today (08/17/21) results negative, on chart ? ?COVID Vaccine Completed: yes x3 ?Date COVID Vaccine completed: 06/25/19, 07/16/19 ?Has received booster: 02/07/20 ?COVID vaccine manufacturer: Pfizer      ? ?Date of COVID positive in last 90 days: no ? ?PCP - Precious Haws, MD ?Cardiologist - n/a ? ?Chest x-ray - yes per pt ?EKG - 08/29/20 Epic ?Stress Test - n/a ?ECHO - n/a ?Cardiac Cath - n/a ?Pacemaker/ICD device last checked: n/a ?Spinal Cord Stimulator: n/a ? ?Bowel Prep - no solid food after 6pm night before ? ?Sleep Study - yes, positive ?CPAP - yes 3-4 times per week ? ?Fasting Blood Sugar - n/a ?Checks Blood Sugar _____ times a day ? ?Blood Thinner Instructions: n/a ?Aspirin Instructions: ?Last Dose: ? ?Activity level: Can go up a flight of stairs and perform activities of daily living without stopping and without symptoms of chest pain or shortness of breath.   ? ?Anesthesia review:  ? ?Patient denies shortness of breath, fever, cough and chest pain at PAT appointment ? ? ?Patient verbalized understanding of instructions that were given to them at the PAT appointment. Patient was also instructed that they will need to review over the PAT instructions again at home before surgery.  ?

## 2021-08-16 ENCOUNTER — Encounter (HOSPITAL_COMMUNITY)
Admission: RE | Admit: 2021-08-16 | Discharge: 2021-08-16 | Disposition: A | Discharge status: Home / Self Care | Payer: Federal, State, Local not specified - PPO | Source: Ambulatory Visit | Attending: Surgery | Admitting: Surgery

## 2021-08-16 ENCOUNTER — Encounter (HOSPITAL_COMMUNITY): Payer: Self-pay

## 2021-08-16 ENCOUNTER — Other Ambulatory Visit: Payer: Self-pay

## 2021-08-16 VITALS — BP 144/90 | HR 74 | Temp 98.6°F | Resp 16 | Ht 65.0 in | Wt 325.0 lb

## 2021-08-16 DIAGNOSIS — Z01812 Encounter for preprocedural laboratory examination: Secondary | ICD-10-CM | POA: Diagnosis present

## 2021-08-16 DIAGNOSIS — Z01818 Encounter for other preprocedural examination: Secondary | ICD-10-CM

## 2021-08-16 LAB — COMPREHENSIVE METABOLIC PANEL
ALT: 17 U/L (ref 0–44)
AST: 17 U/L (ref 15–41)
Albumin: 4.2 g/dL (ref 3.5–5.0)
Alkaline Phosphatase: 90 U/L (ref 38–126)
Anion gap: 6 (ref 5–15)
BUN: 14 mg/dL (ref 6–20)
CO2: 25 mmol/L (ref 22–32)
Calcium: 8.9 mg/dL (ref 8.9–10.3)
Chloride: 105 mmol/L (ref 98–111)
Creatinine, Ser: 0.6 mg/dL (ref 0.44–1.00)
GFR, Estimated: >60 mL/min (ref >60)
Glucose, Bld: 76 mg/dL (ref 70–99)
Potassium: 3.9 mmol/L (ref 3.5–5.1)
Sodium: 136 mmol/L (ref 135–145)
Total Bilirubin: 1.1 mg/dL (ref 0.3–1.2)
Total Protein: 7.8 g/dL (ref 6.5–8.1)

## 2021-08-16 LAB — CBC WITH DIFFERENTIAL/PLATELET
Abs Immature Granulocytes: 0.01 10*3/uL (ref 0.00–0.07)
Basophils Absolute: 0 10*3/uL (ref 0.0–0.1)
Basophils Relative: 0 %
Eosinophils Absolute: 0.1 10*3/uL (ref 0.0–0.5)
Eosinophils Relative: 2 %
HCT: 40.9 % (ref 36.0–46.0)
Hemoglobin: 13.8 g/dL (ref 12.0–15.0)
Immature Granulocytes: 0 %
Lymphocytes Relative: 49 %
Lymphs Abs: 2.4 10*3/uL (ref 0.7–4.0)
MCH: 30.9 pg (ref 26.0–34.0)
MCHC: 33.7 g/dL (ref 30.0–36.0)
MCV: 91.5 fL (ref 80.0–100.0)
Monocytes Absolute: 0.4 10*3/uL (ref 0.1–1.0)
Monocytes Relative: 9 %
Neutro Abs: 2 10*3/uL (ref 1.7–7.7)
Neutrophils Relative %: 40 %
Platelets: 205 10*3/uL (ref 150–400)
RBC: 4.47 MIL/uL (ref 3.87–5.11)
RDW: 14.6 % (ref 11.5–15.5)
WBC: 4.9 10*3/uL (ref 4.0–10.5)
nRBC: 0 % (ref 0.0–0.2)

## 2021-08-20 NOTE — Anesthesia Preprocedure Evaluation (Signed)
Anesthesia Evaluation    Reviewed: Allergy & Precautions, H&P , Patient's Chart, lab work & pertinent test results  Airway        Dental   Pulmonary neg pulmonary ROS, former smoker,           Cardiovascular Exercise Tolerance: Good negative cardio ROS       Neuro/Psych Anxiety Depression negative neurological ROS  negative psych ROS   GI/Hepatic negative GI ROS, Neg liver ROS, GERD  Medicated,  Endo/Other  negative endocrine ROSMorbid obesity  Renal/GU negative Renal ROS  negative genitourinary   Musculoskeletal  (+) Arthritis , Osteoarthritis,    Abdominal   Peds  Hematology negative hematology ROS (+) Blood dyscrasia, anemia ,   Anesthesia Other Findings   Reproductive/Obstetrics negative OB ROS                             Anesthesia Physical Anesthesia Plan  ASA: 3  Anesthesia Plan: General   Post-op Pain Management: Tylenol PO (pre-op)*   Induction: Intravenous  PONV Risk Score and Plan: 4 or greater and Ondansetron, Dexamethasone, Midazolam and Scopolamine patch - Pre-op  Airway Management Planned: Oral ETT  Additional Equipment:   Intra-op Plan:   Post-operative Plan: Extubation in OR  Informed Consent: I have reviewed the patients History and Physical, chart, labs and discussed the procedure including the risks, benefits and alternatives for the proposed anesthesia with the patient or authorized representative who has indicated his/her understanding and acceptance.       Plan Discussed with:   Anesthesia Plan Comments:         Anesthesia Quick Evaluation

## 2021-08-21 ENCOUNTER — Inpatient Hospital Stay (HOSPITAL_COMMUNITY): Payer: Federal, State, Local not specified - PPO | Admitting: Anesthesiology

## 2021-08-21 ENCOUNTER — Encounter (HOSPITAL_COMMUNITY)
Admission: RE | Disposition: A | Discharge status: Home / Self Care | Payer: Self-pay | Source: Home / Self Care | Attending: Surgery

## 2021-08-21 ENCOUNTER — Inpatient Hospital Stay (HOSPITAL_COMMUNITY)
Admission: RE | Admit: 2021-08-21 | Discharge: 2021-08-23 | DRG: 621 | Disposition: A | Discharge status: Home / Self Care | Payer: Federal, State, Local not specified - PPO | Attending: Surgery | Admitting: Surgery

## 2021-08-21 ENCOUNTER — Other Ambulatory Visit: Payer: Self-pay

## 2021-08-21 ENCOUNTER — Other Ambulatory Visit (HOSPITAL_COMMUNITY): Payer: Self-pay

## 2021-08-21 ENCOUNTER — Encounter (HOSPITAL_COMMUNITY): Payer: Self-pay | Admitting: Surgery

## 2021-08-21 DIAGNOSIS — E538 Deficiency of other specified B group vitamins: Secondary | ICD-10-CM | POA: Diagnosis present

## 2021-08-21 DIAGNOSIS — Z6841 Body Mass Index (BMI) 40.0 and over, adult: Secondary | ICD-10-CM

## 2021-08-21 DIAGNOSIS — K429 Umbilical hernia without obstruction or gangrene: Secondary | ICD-10-CM | POA: Diagnosis present

## 2021-08-21 DIAGNOSIS — K5750 Diverticulosis of both small and large intestine without perforation or abscess without bleeding: Secondary | ICD-10-CM | POA: Diagnosis present

## 2021-08-21 DIAGNOSIS — E559 Vitamin D deficiency, unspecified: Secondary | ICD-10-CM | POA: Diagnosis present

## 2021-08-21 DIAGNOSIS — E739 Lactose intolerance, unspecified: Secondary | ICD-10-CM | POA: Diagnosis present

## 2021-08-21 DIAGNOSIS — F32A Depression, unspecified: Secondary | ICD-10-CM | POA: Diagnosis present

## 2021-08-21 DIAGNOSIS — K219 Gastro-esophageal reflux disease without esophagitis: Secondary | ICD-10-CM | POA: Diagnosis present

## 2021-08-21 DIAGNOSIS — G8918 Other acute postprocedural pain: Principal | ICD-10-CM

## 2021-08-21 DIAGNOSIS — E785 Hyperlipidemia, unspecified: Secondary | ICD-10-CM | POA: Diagnosis present

## 2021-08-21 DIAGNOSIS — Z87891 Personal history of nicotine dependence: Secondary | ICD-10-CM

## 2021-08-21 DIAGNOSIS — Z01818 Encounter for other preprocedural examination: Secondary | ICD-10-CM

## 2021-08-21 HISTORY — PX: BARIATRIC SURGERY: SHX1103

## 2021-08-21 LAB — CBC
HCT: 42.1 % (ref 36.0–46.0)
Hemoglobin: 14.1 g/dL (ref 12.0–15.0)
MCH: 30.3 pg (ref 26.0–34.0)
MCHC: 33.5 g/dL (ref 30.0–36.0)
MCV: 90.3 fL (ref 80.0–100.0)
Platelets: 189 10*3/uL (ref 150–400)
RBC: 4.66 MIL/uL (ref 3.87–5.11)
RDW: 14.2 % (ref 11.5–15.5)
WBC: 12.9 10*3/uL — ABNORMAL HIGH (ref 4.0–10.5)
nRBC: 0 % (ref 0.0–0.2)

## 2021-08-21 LAB — TYPE AND SCREEN
ABO/RH(D): O POS
Antibody Screen: NEGATIVE

## 2021-08-21 LAB — CREATININE, SERUM
Creatinine, Ser: 0.52 mg/dL (ref 0.44–1.00)
GFR, Estimated: >60 mL/min (ref >60)

## 2021-08-21 LAB — ABO/RH: ABO/RH(D): O POS

## 2021-08-21 LAB — PREGNANCY, URINE: Preg Test, Ur: NEGATIVE

## 2021-08-21 SURGERY — CREATION, GASTRIC BYPASS, ROUX-EN-Y, ROBOT-ASSISTED
Anesthesia: General | Site: Abdomen

## 2021-08-21 MED ORDER — OXYCODONE HCL 5 MG PO TABS
5.0000 mg | ORAL_TABLET | Freq: Four times a day (QID) | ORAL | 0 refills | Status: AC | PRN
  Filled 2021-08-21: qty 10, 3d supply, fill #0

## 2021-08-21 MED ORDER — ONDANSETRON 4 MG PO TBDP
4.0000 mg | ORAL_TABLET | Freq: Four times a day (QID) | ORAL | 0 refills | Status: AC | PRN
  Filled 2021-08-21: qty 20, 5d supply, fill #0

## 2021-08-21 MED ORDER — ACETAMINOPHEN 500 MG PO TABS: 1000.0000 mg | ORAL_TABLET | Freq: Three times a day (TID) | ORAL | 0 refills | Status: AC

## 2021-08-21 MED ORDER — PANTOPRAZOLE SODIUM 40 MG IV SOLR
40.0000 mg | Freq: Every day | INTRAVENOUS | Status: DC
  Administered 2021-08-21 – 2021-08-22 (×2): 40 mg via INTRAVENOUS
  Filled 2021-08-21 (×2): qty 10

## 2021-08-21 MED ORDER — MORPHINE SULFATE (PF) 2 MG/ML IV SOLN: 1.0000 mg | INTRAVENOUS | Status: DC | PRN

## 2021-08-21 MED ORDER — FENTANYL CITRATE (PF) 100 MCG/2ML IJ SOLN
INTRAMUSCULAR | Status: AC
  Filled 2021-08-21: qty 2

## 2021-08-21 MED ORDER — CHLORHEXIDINE GLUCONATE CLOTH 2 % EX PADS: 6.0000 | MEDICATED_PAD | Freq: Once | CUTANEOUS | Status: DC

## 2021-08-21 MED ORDER — PANTOPRAZOLE SODIUM 40 MG PO TBEC
40.0000 mg | DELAYED_RELEASE_TABLET | Freq: Every day | ORAL | 0 refills | Status: AC
  Filled 2021-08-21: qty 90, 90d supply, fill #0

## 2021-08-21 MED ORDER — SCOPOLAMINE 1 MG/3DAYS TD PT72
MEDICATED_PATCH | TRANSDERMAL | Status: AC
  Filled 2021-08-21: qty 1

## 2021-08-21 MED ORDER — BUPIVACAINE LIPOSOME 1.3 % IJ SUSP
20.0000 mL | Freq: Once | INTRAMUSCULAR | Status: DC
Start: 2021-08-21 — End: 2021-08-21

## 2021-08-21 MED ORDER — ACETAMINOPHEN 500 MG PO TABS: 1000.0000 mg | ORAL_TABLET | Freq: Once | ORAL | Status: DC

## 2021-08-21 MED ORDER — ONDANSETRON HCL 4 MG/2ML IJ SOLN
4.0000 mg | INTRAMUSCULAR | Status: DC | PRN
Start: 2021-08-21 — End: 2021-08-23
  Administered 2021-08-23: 4 mg via INTRAVENOUS
  Filled 2021-08-21 (×2): qty 2

## 2021-08-21 MED ORDER — ENOXAPARIN SODIUM 30 MG/0.3ML IJ SOSY
30.0000 mg | PREFILLED_SYRINGE | Freq: Two times a day (BID) | INTRAMUSCULAR | Status: DC
  Administered 2021-08-21 – 2021-08-23 (×4): 30 mg via SUBCUTANEOUS
  Filled 2021-08-21 (×4): qty 0.3

## 2021-08-21 MED ORDER — LACTATED RINGERS IR SOLN
Status: DC | PRN
  Administered 2021-08-21: 1000 mL

## 2021-08-21 MED ORDER — OXYCODONE HCL 5 MG/5ML PO SOLN
5.0000 mg | Freq: Four times a day (QID) | ORAL | Status: DC | PRN
  Administered 2021-08-21 – 2021-08-22 (×5): 5 mg via ORAL
  Filled 2021-08-21 (×4): qty 5

## 2021-08-21 MED ORDER — ALPRAZOLAM 0.25 MG PO TABS: 0.2500 mg | ORAL_TABLET | Freq: Every day | ORAL | Status: DC | PRN

## 2021-08-21 MED ORDER — SUGAMMADEX SODIUM 200 MG/2ML IV SOLN
INTRAVENOUS | Status: DC | PRN
  Administered 2021-08-21: 500 mg via INTRAVENOUS

## 2021-08-21 MED ORDER — ENOXAPARIN SODIUM 40 MG/0.4ML IJ SOSY
40.0000 mg | PREFILLED_SYRINGE | INTRAMUSCULAR | Status: AC
  Administered 2021-08-21: 40 mg via SUBCUTANEOUS
  Filled 2021-08-21: qty 0.4

## 2021-08-21 MED ORDER — ROCURONIUM BROMIDE 10 MG/ML (PF) SYRINGE
PREFILLED_SYRINGE | INTRAVENOUS | Status: DC | PRN
  Administered 2021-08-21: 80 mg via INTRAVENOUS
  Administered 2021-08-21: 20 mg via INTRAVENOUS
  Administered 2021-08-21: 10 mg via INTRAVENOUS
  Administered 2021-08-21: 20 mg via INTRAVENOUS

## 2021-08-21 MED ORDER — BUPIVACAINE-EPINEPHRINE 0.25% -1:200000 IJ SOLN
INTRAMUSCULAR | Status: DC | PRN
  Administered 2021-08-21: 30 mL

## 2021-08-21 MED ORDER — ORAL CARE MOUTH RINSE: 15.0000 mL | Freq: Once | OROMUCOSAL | Status: AC

## 2021-08-21 MED ORDER — SUGAMMADEX SODIUM 500 MG/5ML IV SOLN
INTRAVENOUS | Status: AC
  Filled 2021-08-21: qty 5

## 2021-08-21 MED ORDER — FENTANYL CITRATE PF 50 MCG/ML IJ SOSY
PREFILLED_SYRINGE | INTRAMUSCULAR | Status: AC
  Filled 2021-08-21: qty 1

## 2021-08-21 MED ORDER — LACTATED RINGERS IV SOLN: INTRAVENOUS | Status: DC

## 2021-08-21 MED ORDER — CHLORHEXIDINE GLUCONATE 0.12 % MT SOLN
15.0000 mL | Freq: Once | OROMUCOSAL | Status: AC
  Administered 2021-08-21: 15 mL via OROMUCOSAL

## 2021-08-21 MED ORDER — SCOPOLAMINE 1 MG/3DAYS TD PT72
1.0000 | MEDICATED_PATCH | TRANSDERMAL | Status: DC
  Administered 2021-08-21: 1.5 mg via TRANSDERMAL

## 2021-08-21 MED ORDER — ONDANSETRON HCL 4 MG/2ML IJ SOLN
INTRAMUSCULAR | Status: AC
  Filled 2021-08-21: qty 2

## 2021-08-21 MED ORDER — BUPIVACAINE LIPOSOME 1.3 % IJ SUSP
INTRAMUSCULAR | Status: DC | PRN
  Administered 2021-08-21: 20 mL

## 2021-08-21 MED ORDER — ACETAMINOPHEN 500 MG PO TABS
1000.0000 mg | ORAL_TABLET | ORAL | Status: DC
  Filled 2021-08-21: qty 2

## 2021-08-21 MED ORDER — ACETAMINOPHEN 160 MG/5ML PO SOLN
1000.0000 mg | Freq: Three times a day (TID) | ORAL | Status: DC
  Administered 2021-08-23: 1000 mg via ORAL
  Filled 2021-08-21: qty 40.6

## 2021-08-21 MED ORDER — ACETAMINOPHEN 500 MG PO TABS
1000.0000 mg | ORAL_TABLET | Freq: Three times a day (TID) | ORAL | Status: DC
  Administered 2021-08-21 – 2021-08-22 (×5): 1000 mg via ORAL
  Filled 2021-08-21 (×5): qty 2

## 2021-08-21 MED ORDER — ROCURONIUM BROMIDE 10 MG/ML (PF) SYRINGE
PREFILLED_SYRINGE | INTRAVENOUS | Status: AC
  Filled 2021-08-21: qty 20

## 2021-08-21 MED ORDER — LIDOCAINE HCL (CARDIAC) PF 100 MG/5ML IV SOSY
PREFILLED_SYRINGE | INTRAVENOUS | Status: DC | PRN
  Administered 2021-08-21: 60 mg via INTRATRACHEAL

## 2021-08-21 MED ORDER — ALBUTEROL SULFATE (2.5 MG/3ML) 0.083% IN NEBU: 3.0000 mL | INHALATION_SOLUTION | Freq: Four times a day (QID) | RESPIRATORY_TRACT | Status: DC | PRN

## 2021-08-21 MED ORDER — APREPITANT 40 MG PO CAPS
40.0000 mg | ORAL_CAPSULE | ORAL | Status: AC
  Administered 2021-08-21: 40 mg via ORAL
  Filled 2021-08-21: qty 1

## 2021-08-21 MED ORDER — SODIUM CHLORIDE 0.9 % IV SOLN
2.0000 g | INTRAVENOUS | Status: AC
  Administered 2021-08-21: 2 g via INTRAVENOUS
  Filled 2021-08-21: qty 2

## 2021-08-21 MED ORDER — SIMETHICONE 80 MG PO CHEW
80.0000 mg | CHEWABLE_TABLET | Freq: Four times a day (QID) | ORAL | Status: DC | PRN
  Administered 2021-08-22: 80 mg via ORAL
  Filled 2021-08-21: qty 1

## 2021-08-21 MED ORDER — OXYCODONE HCL 5 MG/5ML PO SOLN
ORAL | Status: AC
  Filled 2021-08-21: qty 5

## 2021-08-21 MED ORDER — BUPIVACAINE LIPOSOME 1.3 % IJ SUSP
INTRAMUSCULAR | Status: AC
  Filled 2021-08-21: qty 20

## 2021-08-21 MED ORDER — LIDOCAINE HCL (PF) 2 % IJ SOLN
INTRAMUSCULAR | Status: AC
  Filled 2021-08-21: qty 5

## 2021-08-21 MED ORDER — DEXAMETHASONE SODIUM PHOSPHATE 10 MG/ML IJ SOLN
INTRAMUSCULAR | Status: DC | PRN
  Administered 2021-08-21: 10 mg via INTRAVENOUS

## 2021-08-21 MED ORDER — MIDAZOLAM HCL 2 MG/2ML IJ SOLN
INTRAMUSCULAR | Status: AC
  Filled 2021-08-21: qty 2

## 2021-08-21 MED ORDER — ENSURE MAX PROTEIN PO LIQD
2.0000 [oz_av] | ORAL | Status: DC
  Administered 2021-08-22 – 2021-08-23 (×9): 2 [oz_av] via ORAL

## 2021-08-21 MED ORDER — BUPIVACAINE-EPINEPHRINE (PF) 0.25% -1:200000 IJ SOLN
INTRAMUSCULAR | Status: AC
  Filled 2021-08-21: qty 30

## 2021-08-21 MED ORDER — FENTANYL CITRATE (PF) 250 MCG/5ML IJ SOLN
INTRAMUSCULAR | Status: DC | PRN
  Administered 2021-08-21: 100 ug via INTRAVENOUS
  Administered 2021-08-21 (×2): 50 ug via INTRAVENOUS

## 2021-08-21 MED ORDER — DEXAMETHASONE SODIUM PHOSPHATE 10 MG/ML IJ SOLN
INTRAMUSCULAR | Status: AC
  Filled 2021-08-21: qty 1

## 2021-08-21 MED ORDER — FENTANYL CITRATE PF 50 MCG/ML IJ SOSY
25.0000 ug | PREFILLED_SYRINGE | INTRAMUSCULAR | Status: DC | PRN
  Administered 2021-08-21 (×2): 50 ug via INTRAVENOUS

## 2021-08-21 MED ORDER — LIP MEDEX EX OINT
1.0000 "application " | TOPICAL_OINTMENT | CUTANEOUS | Status: DC | PRN
  Filled 2021-08-21: qty 7

## 2021-08-21 MED ORDER — 0.9 % SODIUM CHLORIDE (POUR BTL) OPTIME
TOPICAL | Status: DC | PRN
  Administered 2021-08-21: 1000 mL

## 2021-08-21 MED ORDER — ONDANSETRON HCL 4 MG/2ML IJ SOLN
INTRAMUSCULAR | Status: DC | PRN
  Administered 2021-08-21: 4 mg via INTRAVENOUS

## 2021-08-21 MED ORDER — PROPOFOL 10 MG/ML IV BOLUS
INTRAVENOUS | Status: DC | PRN
  Administered 2021-08-21: 200 mg via INTRAVENOUS

## 2021-08-21 MED ORDER — MIDAZOLAM HCL 2 MG/2ML IJ SOLN
INTRAMUSCULAR | Status: DC | PRN
  Administered 2021-08-21: 2 mg via INTRAVENOUS

## 2021-08-21 SURGICAL SUPPLY — 94 items
ADH SKN CLS APL DERMABOND .7 (GAUZE/BANDAGES/DRESSINGS) ×2
APL PRP STRL LF DISP 70% ISPRP (MISCELLANEOUS) ×2
APPLIER CLIP 5 13 M/L LIGAMAX5 (MISCELLANEOUS)
APPLIER CLIP ROT 10 11.4 M/L (STAPLE)
APR CLP MED LRG 11.4X10 (STAPLE)
APR CLP MED LRG 5 ANG JAW (MISCELLANEOUS)
BALL CTTN LRG ABS STRL LF (GAUZE/BANDAGES/DRESSINGS) ×2
BLADE SURG SZ11 CARB STEEL (BLADE) ×3 IMPLANT
CANNULA REDUC XI 12-8 STAPL (CANNULA) ×3
CANNULA REDUCER 12-8 DVNC XI (CANNULA) ×3 IMPLANT
CHLORAPREP W/TINT 26 (MISCELLANEOUS) ×3 IMPLANT
CLIP APPLIE 5 13 M/L LIGAMAX5 (MISCELLANEOUS) IMPLANT
CLIP APPLIE ROT 10 11.4 M/L (STAPLE) IMPLANT
COTTONBALL LRG STERILE PKG (GAUZE/BANDAGES/DRESSINGS) ×3 IMPLANT
COVER SURGICAL LIGHT HANDLE (MISCELLANEOUS) ×3 IMPLANT
COVER TIP SHEARS 8 DVNC (MISCELLANEOUS) ×2 IMPLANT
COVER TIP SHEARS 8MM DA VINCI (MISCELLANEOUS) ×3
DERMABOND ADVANCED (GAUZE/BANDAGES/DRESSINGS) ×1
DERMABOND ADVANCED .7 DNX12 (GAUZE/BANDAGES/DRESSINGS) ×2 IMPLANT
DRAPE ARM DVNC X/XI (DISPOSABLE) ×8 IMPLANT
DRAPE COLUMN DVNC XI (DISPOSABLE) ×2 IMPLANT
DRAPE DA VINCI XI ARM (DISPOSABLE) ×12
DRAPE DA VINCI XI COLUMN (DISPOSABLE) ×3
DRAPE LAPAROTOMY T 98X78 PEDS (DRAPES) ×2 IMPLANT
DRSG TEGADERM 4X4.75 (GAUZE/BANDAGES/DRESSINGS) ×3 IMPLANT
ELECT REM PT RETURN 15FT ADLT (MISCELLANEOUS) ×3 IMPLANT
GAUZE 4X4 16PLY ~~LOC~~+RFID DBL (SPONGE) ×2 IMPLANT
GLOVE SRG 8 PF TXTR STRL LF DI (GLOVE) ×2 IMPLANT
GLOVE SURG ENC MOIS LTX SZ7.5 (GLOVE) ×4 IMPLANT
GLOVE SURG UNDER LTX SZ8 (GLOVE) ×4 IMPLANT
GLOVE SURG UNDER POLY LF SZ8 (GLOVE) ×3
GOWN STRL REUS W/ TWL XL LVL3 (GOWN DISPOSABLE) ×6 IMPLANT
GOWN STRL REUS W/TWL XL LVL3 (GOWN DISPOSABLE) ×9
GRASPER SUT TROCAR 14GX15 (MISCELLANEOUS) ×3 IMPLANT
IRRIG SUCT STRYKERFLOW 2 WTIP (MISCELLANEOUS) ×3
IRRIGATION SUCT STRKRFLW 2 WTP (MISCELLANEOUS) ×2 IMPLANT
KIT BASIN OR (CUSTOM PROCEDURE TRAY) ×3 IMPLANT
KIT GASTRIC LAVAGE 34FR ADT (SET/KITS/TRAYS/PACK) ×3 IMPLANT
KIT TURNOVER KIT A (KITS) ×1 IMPLANT
LUBRICANT JELLY K Y 4OZ (MISCELLANEOUS) IMPLANT
MARKER SKIN DUAL TIP RULER LAB (MISCELLANEOUS) ×3 IMPLANT
MAT PREVALON FULL STRYKER (MISCELLANEOUS) ×4 IMPLANT
NDL SPNL 18GX3.5 QUINCKE PK (NEEDLE) ×2 IMPLANT
NEEDLE HYPO 22GX1.5 SAFETY (NEEDLE) ×3 IMPLANT
NEEDLE SPNL 18GX3.5 QUINCKE PK (NEEDLE) ×3 IMPLANT
NS IRRIG 1000ML POUR BTL (IV SOLUTION) ×3 IMPLANT
OBTURATOR OPTICAL STANDARD 8MM (TROCAR) ×3
OBTURATOR OPTICAL STND 8 DVNC (TROCAR) ×2
OBTURATOR OPTICALSTD 8 DVNC (TROCAR) ×2 IMPLANT
PACK CARDIOVASCULAR III (CUSTOM PROCEDURE TRAY) ×2 IMPLANT
PACK GENERAL/GYN (CUSTOM PROCEDURE TRAY) ×3 IMPLANT
RELOAD STAPLE 60 2.5 WHT DVNC (STAPLE) ×6 IMPLANT
RELOAD STAPLE 60 3.5 BLU DVNC (STAPLE) IMPLANT
RELOAD STAPLER 2.5X60 WHT DVNC (STAPLE) ×18 IMPLANT
RELOAD STAPLER 3.5X60 BLU DVNC (STAPLE) IMPLANT
SEAL CANN UNIV 5-8 DVNC XI (MISCELLANEOUS) ×6 IMPLANT
SEAL XI 5MM-8MM UNIVERSAL (MISCELLANEOUS) ×12
SEALER SYNCHRO 8 IS4000 DV (MISCELLANEOUS) ×3
SEALER SYNCHRO 8 IS4000 DVNC (MISCELLANEOUS) ×3 IMPLANT
SOL ANTI FOG 6CC (MISCELLANEOUS) ×2 IMPLANT
SOLUTION ANTI FOG 6CC (MISCELLANEOUS) ×1
SOLUTION ELECTROLUBE (MISCELLANEOUS) ×3 IMPLANT
SPIKE FLUID TRANSFER (MISCELLANEOUS) ×2 IMPLANT
SPONGE T-LAP 18X18 ~~LOC~~+RFID (SPONGE) ×3 IMPLANT
STAPLER 60 DA VINCI SURE FORM (STAPLE) ×3
STAPLER 60 SUREFORM DVNC (STAPLE) ×2 IMPLANT
STAPLER CANNULA SEAL DVNC XI (STAPLE) ×2 IMPLANT
STAPLER CANNULA SEAL XI (STAPLE) ×3
STAPLER RELOAD 2.5X60 WHITE (STAPLE) ×27
STAPLER RELOAD 2.5X60 WHT DVNC (STAPLE) ×18
STAPLER RELOAD 3.5X60 BLU DVNC (STAPLE)
STAPLER RELOAD 3.5X60 BLUE (STAPLE)
SUT ETHIBOND 0 (SUTURE) ×4 IMPLANT
SUT MNCRL AB 4-0 PS2 18 (SUTURE) ×4 IMPLANT
SUT NOVA NAB GS-21 0 18 T12 DT (SUTURE) IMPLANT
SUT PDS AB 2-0 CT2 27 (SUTURE) ×2 IMPLANT
SUT V-LOC BARB 180 2/0GR6 GS22 (SUTURE) ×6
SUT VIC AB 2-0 SH 27 (SUTURE) ×3
SUT VIC AB 2-0 SH 27XBRD (SUTURE) IMPLANT
SUT VIC AB 3-0 SH 8-18 (SUTURE) ×3 IMPLANT
SUT VICRYL 0 TIES 12 18 (SUTURE) ×3 IMPLANT
SUT VLOC BARB 180 ABS3/0GR12 (SUTURE) ×6
SUTURE V-LC BRB 180 2/0GR6GS22 (SUTURE) ×4 IMPLANT
SUTURE VLOC BRB 180 ABS3/0GR12 (SUTURE) ×6 IMPLANT
SYR 20ML LL LF (SYRINGE) ×3 IMPLANT
SYR CONTROL 10ML LL (SYRINGE) ×3 IMPLANT
TOWEL OR 17X26 10 PK STRL BLUE (TOWEL DISPOSABLE) ×3 IMPLANT
TRAY FOL W/BAG SLVR 16FR STRL (SET/KITS/TRAYS/PACK) IMPLANT
TRAY FOLEY MTR SLVR 16FR STAT (SET/KITS/TRAYS/PACK) IMPLANT
TRAY FOLEY W/BAG SLVR 16FR LF (SET/KITS/TRAYS/PACK) ×3
TROCAR ADV FIXATION 12X100MM (TROCAR) ×3 IMPLANT
TROCAR Z-THREAD FIOS 5X100MM (TROCAR) ×3 IMPLANT
TUBE CALIBRATION LAPBAND (TUBING) IMPLANT
TUBING INSUFFLATION 10FT LAP (TUBING) ×3 IMPLANT

## 2021-08-21 NOTE — Anesthesia Postprocedure Evaluation (Signed)
Anesthesia Post Note ? ?Patient: MESHIA RAU ? ?Procedure(s) Performed: ROBOTIC GASTRIC BYPASS ROUX-EN-Y WITH UPPER ENDOSCOPY AND SMALL BOWEL RESECTION (Abdomen) ?HERNIA REPAIR UMBILICAL ADULT ? ?  ? ?Patient location during evaluation: PACU ?Anesthesia Type: General ?Level of consciousness: awake and alert ?Pain management: pain level controlled ?Vital Signs Assessment: post-procedure vital signs reviewed and stable ?Respiratory status: spontaneous breathing, nonlabored ventilation, respiratory function stable and patient connected to nasal cannula oxygen ?Cardiovascular status: blood pressure returned to baseline and stable ?Postop Assessment: no apparent nausea or vomiting ?Anesthetic complications: no ? ? ?No notable events documented. ? ?Last Vitals:  ?Vitals:  ? 08/21/21 1145 08/21/21 1217  ?BP: (!) 150/92 (!) 174/97  ?Pulse: 92 91  ?Resp: 18 18  ?Temp:  (!) 36.4 ?C  ?SpO2: 100% 100%  ?  ?Last Pain:  ?Vitals:  ? 08/21/21 1217  ?TempSrc: Oral  ?PainSc:   ? ? ?  ?  ?  ?  ?  ?  ? ?Ferrel Simington,W. EDMOND ? ? ? ? ?

## 2021-08-21 NOTE — Progress Notes (Signed)

## 2021-08-21 NOTE — Progress Notes (Signed)
PHARMACY CONSULT FOR:  Risk Assessment for Post-Discharge VTE Following Bariatric Surgery ? ?Post-Discharge VTE Risk Assessment: ?This patient's probability of 30-day post-discharge VTE is increased due to the factors marked: ? Sleeve gastrectomy  ? Liver disorder (transplant, cirrhosis, or nonalcoholic steatohepatitis)  ? Hx of VTE  ? Hemorrhage requiring transfusion  ? GI perforation, leak, or obstruction  ? ====================================================  ?  Female  ?  Age >/=60 years  ? x BMI >/=50 kg/m2  ?  CHF  ?  Dyspnea at Rest  ?  Paraplegia  ? x Non-gastric-band surgery  ?  Operation Time >/=3 hr  ?  Return to OR   ?  Length of Stay >/= 3 d  ? Hypercoagulable condition  ? Significant venous stasis  ? ? ?Predicted probability of 30-day post-discharge VTE: 0.27% ? ?Other patient-specific factors to consider:NA ? ? ?Recommendation for Discharge: ?No pharmacologic prophylaxis post-discharge ? ? ?Charlotte Meyer is a 56 y.o. female who underwent  ROBOTIC GASTRIC BYPASS ROUX-EN-Y WITH UPPER ENDOSCOPY AND SMALL BOWEL RESECTION on 08/21/21. ?  ?Case start: 0806 ?Case end: 9326 ? ? ?Allergies  ?Allergen Reactions  ? Methocarbamol Itching  ? Penicillins   ?  Yeast infections ?Has patient had a PCN reaction causing immediate rash, facial/tongue/throat swelling, SOB or lightheadedness with hypotension: No ?Has patient had a PCN reaction causing severe rash involving mucus membranes or skin necrosis: No ?Has patient had a PCN reaction that required hospitalization No ?Has patient had a PCN reaction occurring within the last 10 years: No ?If all of the above answers are "NO", then may proceed with Cephalosporin use. ?  ? Tramadol Other (See Comments)  ?  Loopy and confused  ? Latex Rash  ? ? ?Patient Measurements: ?Height: '5\' 5"'$  (165.1 cm) ?Weight: (!) 145 kg (319 lb 9.6 oz) ?IBW/kg (Calculated) : 57 ?Body mass index is 53.18 kg/m?. ? ?No results for input(s): WBC, HGB, HCT, PLT, APTT, CREATININE, LABCREA,  CREATININE, CREAT24HRUR, MG, PHOS, ALBUMIN, PROT, ALBUMIN, AST, ALT, ALKPHOS, BILITOT, BILIDIR, IBILI in the last 72 hours. ?Estimated Creatinine Clearance: 115.7 mL/min (by C-G formula based on SCr of 0.6 mg/dL). ? ? ? ?Past Medical History:  ?Diagnosis Date  ? Allergic rhinitis   ? Anemia   ? Depression   ? treated with zoloft in past  ? GERD (gastroesophageal reflux disease)   ? History of UTI   ? Hyperlipemia   ? no treatment required  ? Lactose intolerance   ? OA (osteoarthritis) of knee   ? bilaterally   ? Obesity   ? ? ? ?Facility-Administered Medications Prior to Admission  ?Medication Dose Route Frequency Provider Last Rate Last Admin  ? betamethasone acetate-betamethasone sodium phosphate (CELESTONE) injection 3 mg  3 mg Intramuscular Once Daylene Katayama M, DPM      ? betamethasone acetate-betamethasone sodium phosphate (CELESTONE) injection 3 mg  3 mg Intramuscular Once Edrick Kins, DPM      ? ?Medications Prior to Admission  ?Medication Sig Dispense Refill Last Dose  ? acetaminophen (TYLENOL) 650 MG CR tablet Take 500 mg by mouth every 8 (eight) hours as needed for pain. 2 tablets   08/21/2021 at 0430  ? ALPRAZolam (NIRAVAM) 0.25 MG dissolvable tablet Take 0.25 mg by mouth daily as needed for anxiety.   08/21/2021 at 0430  ? NON FORMULARY Pt uses a cpap nightly   08/20/2021  ? orphenadrine (NORFLEX) 100 MG tablet Take 100 mg by mouth 2 (two) times daily as needed for muscle  spasms.   Past Month  ? pantoprazole (PROTONIX) 40 MG tablet Take 1 tablet (40 mg total) by mouth daily. (Patient taking differently: Take 40 mg by mouth daily as needed (heart burn).) 90 tablet 3 08/21/2021 at 0430  ? albuterol (VENTOLIN HFA) 108 (90 Base) MCG/ACT inhaler Inhale 2 puffs into the lungs every 6 (six) hours as needed for wheezing.   More than a month  ? ? ? ? ?Charlotte Meyer [place], PharmD, BCPS ?Clinical Staff Pharmacist ?[email or web address] ?[place]eyer [place], [employer] ?08/21/2021,11:22 AM ? ?

## 2021-08-21 NOTE — H&P (Addendum)
? ?Admitting Physician: Nickola Major Brenten Janney ? ?Service: Bariatric surgery ? ?CC: Morbid obesity ? ?Subjective  ? ?HPI: ?Charlotte Meyer is an 56 y.o. female who is here for umbilical hernia repair and gastric bypass. ? ?Past Medical History:  ?Diagnosis Date  ? Allergic rhinitis   ? Anemia   ? Depression   ? treated with zoloft in past  ? GERD (gastroesophageal reflux disease)   ? History of UTI   ? Hyperlipemia   ? no treatment required  ? Lactose intolerance   ? OA (osteoarthritis) of knee   ? bilaterally   ? Obesity   ? ? ?Past Surgical History:  ?Procedure Laterality Date  ? BIOPSY  04/03/2021  ? Procedure: BIOPSY;  Surgeon: Otis Brace, MD;  Location: [facility];  Service: Gastroenterology;;  ? CHOLECYSTECTOMY N/A 09/12/2015  ? Procedure: LAPAROSCOPIC CHOLECYSTECTOMY WITH INTRAOPERATIVE CHOLANGIOGRAM;  Surgeon: Jackolyn Confer, MD;  Location: [facility];  Service: General;  Laterality: N/A;  ? COLONOSCOPY WITH PROPOFOL N/A 03/08/2016  ? Procedure: COLONOSCOPY WITH PROPOFOL;  Surgeon: Otis Brace, MD;  Location: [facility];  Service: Gastroenterology;  Laterality: N/A;  ? COLONOSCOPY WITH PROPOFOL N/A 04/03/2021  ? Procedure: COLONOSCOPY WITH PROPOFOL;  Surgeon: Otis Brace, MD;  Location: [facility];  Service: Gastroenterology;  Laterality: N/A;  ? ESOPHAGOGASTRODUODENOSCOPY N/A 04/03/2021  ? Procedure: ESOPHAGOGASTRODUODENOSCOPY (EGD);  Surgeon: Otis Brace, MD;  Location: [facility];  Service: Gastroenterology;  Laterality: N/A;  ? FOOT SURGERY    ? POLYPECTOMY  04/03/2021  ? Procedure: POLYPECTOMY;  Surgeon: Otis Brace, MD;  Location: [facility];  Service: Gastroenterology;;  ? WISDOM TOOTH EXTRACTION    ? ? ?Family History  ?Problem Relation Age of Onset  ? Cancer Father   ?     throat  ? Sleep apnea Brother   ? Breast cancer Maternal Aunt   ?     ? age of onset  ? Diabetes Other   ? Cholelithiasis Other   ? ? ?Social:  reports that she has quit smoking. She has never  used smokeless tobacco. She reports that she does not drink alcohol and does not use drugs. ? ?Allergies:  ?Allergies  ?Allergen Reactions  ? Methocarbamol Itching  ? Penicillins   ?  Yeast infections ?Has patient had a PCN reaction causing immediate rash, facial/tongue/throat swelling, SOB or lightheadedness with hypotension: No ?Has patient had a PCN reaction causing severe rash involving mucus membranes or skin necrosis: No ?Has patient had a PCN reaction that required hospitalization No ?Has patient had a PCN reaction occurring within the last 10 years: No ?If all of the above answers are "NO", then may proceed with Cephalosporin use. ?  ? Tramadol Other (See Comments)  ?  Loopy and confused  ? Latex Rash  ? ? ?Medications: ?Current Outpatient Medications  ?Medication Instructions  ? acetaminophen (TYLENOL) 500 mg, Oral, Every 8 hours PRN, 2 tablets  ? albuterol (VENTOLIN HFA) 108 (90 Base) MCG/ACT inhaler 2 puffs, Inhalation, Every 6 hours PRN  ? ALPRAZolam (NIRAVAM) 0.25 mg, Oral, Daily PRN  ? NON FORMULARY Pt uses a cpap nightly  ? orphenadrine (NORFLEX) 100 mg, Oral, 2 times daily PRN  ? pantoprazole (PROTONIX) 40 mg, Oral, Daily  ? ? ?ROS - all of the below systems have been reviewed with the patient and positives are indicated with bold text ?General: chills, fever or night sweats ?Eyes: blurry vision or double vision ?ENT: epistaxis or sore throat ?Allergy/Immunology: itchy/watery eyes or nasal congestion ?Hematologic/Lymphatic: bleeding problems,  blood clots or swollen lymph nodes ?Endocrine: temperature intolerance or unexpected weight changes ?Breast: new or changing breast lumps or nipple discharge ?Resp: cough, shortness of breath, or wheezing ?CV: chest pain or dyspnea on exertion ?GI: as per HPI ?GU: dysuria, trouble voiding, or hematuria ?MSK: joint pain or joint stiffness ?Neuro: TIA or stroke symptoms ?Derm: pruritus and skin lesion changes ?Psych: anxiety and depression ? ?Objective   ? ?PE ?Blood pressure 139/84, pulse 83, temperature 98.4 ?F (36.9 ?C), temperature source Oral, resp. rate 18, height '5\' 5"'$  (1.651 m), weight (!) 145 kg, last menstrual period 07/15/2021, SpO2 97 %, unknown if currently breastfeeding. ?Constitutional: NAD; conversant; no deformities ?Eyes: Moist conjunctiva; no lid lag; anicteric; PERRL ?Neck: Trachea midline; no thyromegaly ?Lungs: Normal respiratory effort; no tactile fremitus ?CV: RRR; no palpable thrills; no pitting edema ?GI: Abd Soft, non-tender, umbilical hernia; no palpable hepatosplenomegaly ?MSK: Normal range of motion of extremities; no clubbing/cyanosis ?Psychiatric: Appropriate affect; alert and oriented x3 ?Lymphatic: No palpable cervical or axillary lymphadenopathy ? ?Results for orders placed or performed during the hospital encounter of 08/21/21 (from the past 24 hour(s))  ?Pregnancy, Urine     Status: None  ? Collection Time: 08/21/21  5:19 AM  ?Result Value Ref Range  ? Preg Test, Ur NEGATIVE NEGATIVE  ? ? ?Imaging Orders  ?No imaging studies ordered today  ? ? ? ?Assessment and Plan  ?Charlotte Meyer is a 56 y.o. female who was seen originally on 07/04/20 as an office consultation for initial bariatric surgery consultation. The patient has morbid obesity with a BMI of 56 and the following conditions related to obesity: Sleep apnea, GERD, hyperlipidemia, and umbilical hernia with possible endometrial implants. She originally presented to discuss umbilical hernia repair, but we decided to pursue weight loss surgery prior to umbilical hernia repair to lower her risk of hernia recurrence and surgical complication. ?  ?We discussed the surgical options to treat obesity and its associated comorbidity. After discussing the available procedures in the region, we discussed in great detail the surgeries I offer: robotic sleeve gastrectomy and robotic roux-en-y gastric bypass. We discussed the procedures themselves as well as their risks, benefits and  alternatives. I entered the patient's basic information into the [facility] Metabolic Surgery Risk/Benefit Calculator to facilitate this discussion.  ?  ?The patient is interested in a robotic gastric bypass with upper endoscopy and umbilical hernia repair. ?  ?She has completed the following bariatric surgery preoperative pathway: ?- Bloodwork - vitamin D deficiency, hyperlipidemia noted ?- Dietician consult - Completed with Sandie Ano, RD 04/05/21 ?- Chest x-ray - no acute findings 08/15/20 ?- EKG - normal sinus rhythm 08/29/20 ?- Psychology evaluation - completed with [facility], plans for update follow up visit prior to surgery ?- Sleep study 01/16/21 with Dr. Rexene Alberts with mild sleep apnea, started on autoPAP ?- Upper endoscopy and colonoscopy completed with Dr. Alessandra Bevels 04/03/21 ?EGD: ?- Z-line variable, 39 cm from the incisors. Biopsied. - Intestinal metaplasia with no dysplasia or malignancy consistent with Barrett's ?- Chronic gastritis. Biopsied. ?- Normal duodenal bulb, first portion of the duodenum and second portion of the duodenum. ?Colon: ?- One 7 mm tubular adenoma and one 3 mm tubular adenoma ?- Diverticulosis in the sigmoid colon and in the descending colon. ?- Internal hemorrhoids.  ?  ?  ?  ?  ?Today, we again discussed the benefits and risks of a sleeve gastrectomy versus a robotic gastric bypass. With her history of Barrett's esophagus I encouraged her to consider a  gastric bypass as this will not only treat her obesity but also will treat her gastric reflux. She was in agreement with this plan. We discussed addressing umbilical hernia at the time of gastric bypass. Due to her body habitus and possible need for future abdominoplasty after significant weight loss, I explained that I would likely just remove her bellybutton and leave her with a scar in that area to avoid any healing issues in this area, especially as there may be an endometrial implant causing some bleeding in this area. After  full discussion all questions answered the patient granted consent to proceed. We will proceed as scheduled with a robotic gastric bypass with upper endoscopy and umbilical hernia repair. ? ?  ? ? ? ?Nickola Major Stechschul

## 2021-08-21 NOTE — Progress Notes (Signed)
Pt said that she did not want to wear CPAP tonight.  I told her that if she changed her mind to let us know. ?

## 2021-08-21 NOTE — Transfer of Care (Signed)
Immediate Anesthesia Transfer of Care Note ? ?Patient: Charlotte Meyer ? ?Procedure(s) Performed: ROBOTIC GASTRIC BYPASS ROUX-EN-Y WITH UPPER ENDOSCOPY AND SMALL BOWEL RESECTION (Abdomen) ?HERNIA REPAIR UMBILICAL ADULT ? ?Patient Location: PACU ? ?Anesthesia Type:General ? ?Level of Consciousness: alert  and sedated ? ?Airway & Oxygen Therapy: Patient connected to face mask oxygen ? ?Post-op Assessment: Report given to RN and Post -op Vital signs reviewed and stable ? ?Post vital signs: stable ? ?Last Vitals:  ?Vitals Value Taken Time  ?BP 143/97 08/21/21 1100  ?Temp    ?Pulse 86 08/21/21 1103  ?Resp 16 08/21/21 1103  ?SpO2 100 % 08/21/21 1103  ?Vitals shown include unvalidated device data. ? ?Last Pain:  ?Vitals:  ? 08/21/21 0635  ?TempSrc:   ?PainSc: 3   ?   ? ?Patients Stated Pain Goal: 2 (08/21/21 3149) ? ?Complications: No notable events documented. ?

## 2021-08-21 NOTE — Anesthesia Procedure Notes (Signed)
Procedure Name: Intubation ?Date/Time: 08/21/2021 7:43 AM ?Performed by: Pilar Grammes, CRNA ?Pre-anesthesia Checklist: Patient identified, Emergency Drugs available, Suction available, Patient being monitored and Timeout performed ?Patient Re-evaluated:Patient Re-evaluated prior to induction ?Oxygen Delivery Method: Circle system utilized ?Preoxygenation: Pre-oxygenation with 100% oxygen ?Induction Type: IV induction ?Ventilation: Mask ventilation without difficulty ?Laryngoscope Size: Mac and 4 ?Grade View: Grade I ?Tube type: Oral ?Tube size: 7.5 mm ?Number of attempts: 1 ?Airway Equipment and Method: Stylet ?Placement Confirmation: positive ETCO2, ETT inserted through vocal cords under direct vision, CO2 detector and breath sounds checked- equal and bilateral ?Tube secured with: Tape ?Dental Injury: Teeth and Oropharynx as per pre-operative assessment  ? ? ? ? ?

## 2021-08-21 NOTE — Progress Notes (Signed)
Patient walked to chair from stretcher, complains of nausea and pain. Pt was given Tylenol for pain, she reaches up to 750 on IS and needs more instruction on use. Pt started drinking water at @ 1330. ?

## 2021-08-21 NOTE — Op Note (Signed)
? ?Patient: Charlotte Meyer ([birth year], [id number]) ? ?Date of Surgery: 08/21/2021  ? ?Preoperative Diagnosis: Morbid obesity, Umbilical hernia ? ?Postoperative Diagnosis: Morbid obesity, Umbilical hernia (1.4GY x 0.5cm), Small bowel diverticulum ? ?Surgical Procedure: ROBOTIC GASTRIC BYPASS ROUX-EN-Y WITH UPPER ENDOSCOPY AND SMALL BOWEL RESECTION: 18563 (CPT?) ?HERNIA REPAIR UMBILICAL ADULT:   ? ?Operative Team Members:  ?Surgeon(s) and Role: ?   * Amiya Escamilla, Nickola Major, MD - Primary ?   Johnathan Hausen, MD - Assisting  ? [place], [place] Student ? ?Anesthesiologist: Roderic Palau, MD ?CRNA: Lind Covert, CRNA; Pilar Grammes, CRNA  ? ?Anesthesia: General  ? ?Fluids:  ?Total I/O ?In: 1000 [I.V.:1000] ?Out: 450 [Urine:400; Blood:50] ? ?Complications: None ? ?Drains:  none  ? ?Specimen:  ?ID Type Source Tests Collected by Time Destination  ?1 : small bowel Tissue PATH GI Other SURGICAL PATHOLOGY Exodus Kutzer, Nickola Major, MD 08/21/2021 1497   ?2 : UMBILICUS  Tissue PATH Other SURGICAL PATHOLOGY Christeen Lai, Nickola Major, MD 08/21/2021 1016   ?  ? ?Disposition:  PACU - hemodynamically stable. ? ?Plan of Care: Admit to inpatient  ? ? ? ?Indications for Procedure: ?Charlotte Meyer is a 56 y.o. female who was seen originally on 07/04/20 as an office consultation for initial bariatric surgery consultation. The patient has morbid obesity with a BMI of 53 and the following conditions related to obesity: Sleep apnea, GERD with Barrett's esophagus, hyperlipidemia, and umbilical hernia with possible endometrial implants. She originally presented to discuss umbilical hernia repair, but we decided to pursue weight loss surgery prior to umbilical hernia repair to lower her risk of hernia recurrence and surgical complication. ?  ?We discussed the surgical options to treat obesity and its associated comorbidity. After discussing the available procedures in the region, we discussed in great detail the surgeries I offer: robotic  sleeve gastrectomy and robotic roux-en-y gastric bypass. We discussed the procedures themselves as well as their risks, benefits and alternatives. I entered the patient's basic information into the [facility] Metabolic Surgery Risk/Benefit Calculator to facilitate this discussion.  ?  ?The patient is interested in a robotic gastric bypass with upper endoscopy and umbilical hernia repair. ?  ?She has completed the following bariatric surgery preoperative pathway: ?- Bloodwork - vitamin D deficiency, hyperlipidemia noted ?- Dietician consult - Completed with Sandie Ano, RD 04/05/21 ?- Chest x-ray - no acute findings 08/15/20 ?- EKG - normal sinus rhythm 08/29/20 ?- Psychology evaluation - completed with [facility], plans for update follow up visit prior to surgery ?- Sleep study 01/16/21 with Dr. Rexene Alberts with mild sleep apnea, started on autoPAP ?- Upper endoscopy and colonoscopy completed with Dr. Alessandra Bevels 04/03/21 ?EGD: ?- Z-line variable, 39 cm from the incisors. Biopsied. - Intestinal metaplasia with no dysplasia or malignancy consistent with Barrett's ?- Chronic gastritis. Biopsied. ?- Normal duodenal bulb, first portion of the duodenum and second portion of the duodenum. ?Colon: ?- One 7 mm tubular adenoma and one 3 mm tubular adenoma ?- Diverticulosis in the sigmoid colon and in the descending colon. ?- Internal hemorrhoids.  ?  ?  ?Today, we again discussed the benefits and risks of a sleeve gastrectomy versus a robotic gastric bypass. With her history of Barrett's esophagus I encouraged her to consider a gastric bypass as this will not only treat her obesity but also will treat her gastric reflux. She was in agreement with this plan. We discussed addressing umbilical hernia at the time of gastric bypass. Due to her body habitus and possible need for  future abdominoplasty after significant weight loss, I explained that I would likely just remove her bellybutton and leave her with a scar in that area to  avoid any healing issues in this area, especially as there may be an endometrial implant causing some bleeding in this area. After full discussion all questions answered the patient granted consent to proceed. We will proceed as scheduled with a robotic gastric bypass with upper endoscopy and umbilical hernia repair.  ? ?Findings: Small bowel diverticulum on antimesenteric border of jejunum approximately 50 cm from ileocecal valve.  Umbilical hernia (0.2HE x 0.5 cm closed with 0-vicryl figure of eight suture) containing omental fat - no evidence of mass at base of umbilicus ? ?Infection status: ?Patient: Private Patient Elective Case ?Case: Elective ?Infection Present At Time Of Surgery (PATOS): Some spillage of foregut  and jejunal contents while creating anastomoses ? ? ?Description of Procedure:  ? ?On the date stated above, the patient was taken to the operating room suite and placed in supine positioning.  General endotracheal anesthesia was induced.  A timeout was completed verifying the correct patient, procedure, positioning and equipment needed for the case.  The patient's abdomen was prepped and draped in the usual sterile fashion.  A 5 mm trocar was used to enter the right upper quadrant using optical technique.  The abdomen was entered safely without any trauma the underlying viscera.  Three additional incisions were made and 4 robotic trochars were placed across the abdomen, replacing the 5 mm trocar with the 12 mm robotic stapler trocar. An assistant 68m trocar was placed in the left abdomen. The [facility] retractor was placed through the subxiphoid region and under the left lobe of the liver and was connected to the rail of the bed.  A TAP block was placed using marcaine and Exparel under direct vision of the laparoscope.  The dLockportwas docked and we transitioned to robotic surgery. ? ?Using the tip up grasper, fenestrated bipolar, 30 degree camera and Synchroseal from the  patient's right to left, we began by dissecting the angle of His off the left crus of the diaphragm.  The adhesions between the stomach, spleen and diaphragm were divided using the Synchroseal to define the angle of His.  I then started 4-6 cm down on the lesser curve of the stomach and created a defect in the gastrohepatic ligament tracking behind the lesser curve of the stomach to enter the lesser sac.  I then used multiple white loads of the robotic 60 mm Sureform linear stapler to form the gastric pouch.  I then directed my attention to the lower abdomen.  The omentum was divided with the Synchroseal and I identified the ligament of treitz.  The jejunum was run to a point 50 cm from the ligament of Treitz.  This loop of bowel was then brought into the left upper quadrant, over the transverse colon, between the split omentum.  A diverticulum was noted in this location and positioned just proximal to the GJ anastomosis in preparation for resection.  A 2-0 v-loc suture was used to create the posterior outer row of the gastrojejunal anastomosis.  An approximately 2 cm gastrotomy was made in the pouch and a matching 2 cm enterotomy was created in the roux limb.  Then, two 3-0 v-loc sutures were used to create a posterior, inner, full thickness layer of the anastomosis.  While sewing the anterior inner layer, the Ewald tube was passed through the anastomosis to ensure  patency.  The outer, anterior layer was then created using 2-0 v-loc suture.  A window was made in the jejunal mesentery and the jejunum was divided just proximal to the gastrojejunal anastomosis using a white load of the robotic 60 mm Sureform linear stapler to divide the roux limb from the hepatobiliary limb.  I divided the jejunal mesentery at the location of the jejunal diverticulum and fired a second white load of the endoscopic linear stapler to resect the small bowel in the location of the diverticulum.  This was passed off the field as a  specimen.  At this point the gastrojejunal anastomosis was complete and the Ewald tube was removed. ? ?The roux limb was then run 100 cm from the gastrojejunal anastomosis.  This loop of bowel was brought into the

## 2021-08-22 ENCOUNTER — Other Ambulatory Visit (HOSPITAL_COMMUNITY): Payer: Self-pay

## 2021-08-22 ENCOUNTER — Encounter (HOSPITAL_COMMUNITY): Payer: Self-pay | Admitting: Surgery

## 2021-08-22 LAB — CBC WITH DIFFERENTIAL/PLATELET
Abs Immature Granulocytes: 0.04 10*3/uL (ref 0.00–0.07)
Basophils Absolute: 0 10*3/uL (ref 0.0–0.1)
Basophils Relative: 0 %
Eosinophils Absolute: 0 10*3/uL (ref 0.0–0.5)
Eosinophils Relative: 0 %
HCT: 38.9 % (ref 36.0–46.0)
Hemoglobin: 12.7 g/dL (ref 12.0–15.0)
Immature Granulocytes: 0 %
Lymphocytes Relative: 11 %
Lymphs Abs: 1.2 10*3/uL (ref 0.7–4.0)
MCH: 29.9 pg (ref 26.0–34.0)
MCHC: 32.6 g/dL (ref 30.0–36.0)
MCV: 91.5 fL (ref 80.0–100.0)
Monocytes Absolute: 1 10*3/uL (ref 0.1–1.0)
Monocytes Relative: 9 %
Neutro Abs: 8.5 10*3/uL — ABNORMAL HIGH (ref 1.7–7.7)
Neutrophils Relative %: 80 %
Platelets: 198 10*3/uL (ref 150–400)
RBC: 4.25 MIL/uL (ref 3.87–5.11)
RDW: 14.4 % (ref 11.5–15.5)
WBC: 10.8 10*3/uL — ABNORMAL HIGH (ref 4.0–10.5)
nRBC: 0 % (ref 0.0–0.2)

## 2021-08-22 NOTE — Progress Notes (Signed)
Progress Note: General Surgery Service  ? ?Chief Complaint/Subjective: ?Headache ?Pain at umbilicus and stapler incisions ?Starting on proteins ?Walked in hall ?Groggy yesterday ? ?Objective: ?Vital signs in last 24 hours: ?Temp:  [97.5 ?F (36.4 ?C)-99.8 ?F (37.7 ?C)] 98.4 ?F (36.9 ?C) (03/15 5284) ?Pulse Rate:  [86-101] 100 (03/15 1324) ?Resp:  [13-20] 18 (03/15 4010) ?BP: (143-185)/(85-109) 185/99 (03/15 2725) ?SpO2:  [97 %-100 %] 100 % (03/15 3664) ?Last BM Date : 08/20/21 ? ?Intake/Output from previous day: ?03/14 0701 - 03/15 0700 ?In: 3674.2 [P.O.:365; I.V.:3309.2] ?Out: 3600 [Urine:3550; Blood:50] ?Intake/Output this shift: ?Total I/O ?In: 265 [I.V.:265] ?Out: -  ? ?GI: Abd incisions c/d/I w/ glue ? ?Lab Results: ?CBC  ?Recent Labs  ?  08/21/21 ?1454 08/22/21 ?0420  ?WBC 12.9* 10.8*  ?HGB 14.1 12.7  ?HCT 42.1 38.9  ?PLT 189 198  ? ?BMET ?Recent Labs  ?  08/21/21 ?1454  ?CREATININE 0.52  ? ?PT/INR ?No results for input(s): LABPROT, INR in the last 72 hours. ?ABG ?No results for input(s): PHART, HCO3 in the last 72 hours. ? ?Invalid input(s): PCO2, PO2 ? ?Anti-infectives: ?Anti-infectives (From admission, onward)  ? ? Start     Dose/Rate Route Frequency Ordered Stop  ? 08/21/21 0600  cefoTEtan (CEFOTAN) 2 g in sodium chloride 0.9 % 100 mL IVPB       ? 2 g ?200 mL/hr over 30 Minutes Intravenous On call to O.R. 08/21/21 4034 08/21/21 0757  ? ?  ? ? ?Medications: ?Scheduled Meds: ? acetaminophen  1,000 mg Oral Q8H  ? Or  ? acetaminophen (TYLENOL) oral liquid 160 mg/5 mL  1,000 mg Oral Q8H  ? enoxaparin (LOVENOX) injection  30 mg Subcutaneous Q12H  ? pantoprazole (PROTONIX) IV  40 mg Intravenous QHS  ? Ensure Max Protein  2 oz Oral Q2H  ? ?Continuous Infusions: ? lactated ringers 75 mL/hr at 08/22/21 0304  ? ?PRN Meds:.albuterol, ALPRAZolam, lip balm, morphine injection, ondansetron (ZOFRAN) IV, oxyCODONE, simethicone ? ?Assessment/Plan: ?s/p Procedure(s): ?ROBOTIC GASTRIC BYPASS ROUX-EN-Y WITH UPPER ENDOSCOPY AND  SMALL BOWEL RESECTION ?HERNIA REPAIR UMBILICAL ADULT 7/42/5956 ? ?Doing well POD1 ?Slow to wake up from anesthesia ?Continue protocol ?Plan for discharge tomorrow ? ? LOS: 1 day  ? ? ?Felicie Morn, MD ? ?[facility] ?Use [email or web address] to contact on call provider ? ?Daily Billing: ?[phone] - post op ? ? ?

## 2021-08-22 NOTE — Progress Notes (Signed)
Patient alert and oriented, pain is controlled. Patient is tolerating fluids, advanced to protein shake today, patient is tolerating well. Reviewed Gastric Bypass discharge instructions with patient and patient is able to articulate understanding. Provided information on BELT program, Support Group and [facility] outpatient pharmacy. All questions answered, will continue to monitor.    

## 2021-08-22 NOTE — Progress Notes (Signed)
Transition of Care (TOC) Screening Note ? ?Patient Details  ?Name: Charlotte Meyer ?Date of Birth: [birth year] ? ?Transition of Care (TOC) CM/SW Contact:    ?Sherie Don, LCSW ?Phone Number: ?08/22/2021, 11:51 AM ? ?Transition of Care Department [facility]) has reviewed patient and no TOC needs have been identified at this time. We will continue to monitor patient advancement through interdisciplinary progression rounds. If new patient transition needs arise, please place a TOC consult. ?

## 2021-08-22 NOTE — Discharge Instructions (Signed)
GASTRIC BYPASS / SLEEVE  ?Home Care Instructions ? ?These instructions are to help you care for yourself when you go home. ? ?Call: If you have any problems. ?Call [phone] and ask for the surgeon on call ?If you have an emergency related to your surgery please use the ER at [place].  ?Tell the ER staff that you are a new post-op gastric bypass or gastric sleeve patient ?  ?Signs and symptoms to report: Severe vomiting or nausea ?If you cannot handle clear liquids for longer than 1 day, call your surgeon  ?Abdominal pain which does not get better after taking your pain medication ?Fever greater than 100.4? F and chills ?Heart rate over 100 beats a minute ?Trouble breathing ?Chest pain ? Redness, swelling, drainage, or foul odor at incision (surgical) sites ? If your incisions open or pull apart ?Swelling or pain in calf (lower leg) ?Diarrhea (Loose bowel movements that happen often), frequent watery, uncontrolled bowel movements ?Constipation, (no bowel movements for 3 days) if this happens:  ?Take Milk of Magnesia, 2 tablespoons by mouth, 3 times a day for 2 days if needed ?Stop taking Milk of Magnesia once you have had a bowel movement ?Call your doctor if constipation continues ?Or ?Take Miralax  (instead of Milk of Magnesia) following the label instructions ?Stop taking Miralax once you have had a bowel movement ?Call your doctor if constipation continues ?Anything you think is ?abnormal for you? ?  ?Normal side effects after surgery: Unable to sleep at night or unable to concentrate ?Irritability ?Being tearful (crying) or depressed ?These are common complaints, possibly related to your anesthesia, stress of surgery and change in lifestyle, that usually go away a few weeks after surgery.  If these feelings continue, call your medical doctor.  ?Wound Care: You may have surgical glue, steri-strips, or staples over your incisions after surgery ?Surgical glue:  Looks like a clear film over your incisions  and will wear off a little at a time ?Steri-strips : Adhesive strips of tape over your incisions. You may notice a yellowish color on the skin under the steri-strips. This is used to make the   steri-strips stick better. Do not pull the steri-strips off - let them fall off ?Staples: Staples may be removed before you leave the hospital ?If you go home with staples, call [facility] at for an appointment with your surgeon?s nurse to have staples removed 10 days after surgery, [phone] ?Showering: You may shower two (2) days after your surgery unless your surgeon tells you differently ?Wash gently around incisions with warm soapy water, rinse well, and gently pat dry  ?If you have a drain (tube from your incision), you may need someone to hold this while you shower  ?No tub baths until staples are removed and incisions are healed   ?  ?Medications: Medications should be liquid or crushed if larger than the size of a dime ?Extended release pills (medication that releases a little bit at a time through the day) should not be crushed ?Depending on the size and number of medications you take, you may need to space (take a few throughout the day)/change the time you take your medications so that you do not over-fill your pouch (smaller stomach) ?Make sure you follow-up with your primary care physician to make medication changes needed during rapid weight loss and life-style changes ?If you have diabetes, follow up with the doctor that orders your diabetes medication(s) within one week after surgery and check   your blood sugar regularly. ?Do not drive while taking narcotics (pain medications) ?DO NOT take NSAID'S (Examples of NSAID's include ibuprofen, naproxen)  ?Diet:                    First 2 Weeks ? You will see the nutritionist about two (2) weeks after your surgery. The nutritionist will increase the types of foods you can eat if you are handling liquids well: ?If you have severe vomiting or nausea  and cannot handle clear liquids lasting longer than 1 day, call your surgeon  ?Protein Shake ?Drink at least 2 ounces of shake 5-6 times per day ?Each serving of protein shakes (usually 8 - 12 ounces) should have a minimum of:  ?15 grams of protein  ?And no more than 5 grams of carbohydrate  ?Goal for protein each day: ?Men = 80 grams per day ?Women = 60 grams per day ?Protein powder may be added to fluids such as non-fat milk or Lactaid milk or Soy milk (limit to 35 grams added protein powder per serving) ? ?Hydration ?Slowly increase the amount of water and other clear liquids as tolerated (See Acceptable Fluids) ?Slowly increase the amount of protein shake as tolerated  ? Sip fluids slowly and throughout the day ?May use sugar substitutes in small amounts (no more than 6 - 8 packets per day; i.e. Splenda) ? ?Fluid Goal ?The first goal is to drink at least 8 ounces of protein shake/drink per day (or as directed by the nutritionist);  See handout from pre-op Bariatric Education Class for examples of protein shake/drink.   ?Slowly increase the amount of protein shake you drink as tolerated ?You may find it easier to slowly sip shakes throughout the day ?It is important to get your proteins in first ?Your fluid goal is to drink 64 - 100 ounces of fluid daily ?It may take a few weeks to build up to this ?32 oz (or more) should be clear liquids  ?And  ?32 oz (or more) should be full liquids (see below for examples) ?Liquids should not contain sugar, caffeine, or carbonation ? ?Clear Liquids: ?Water or Sugar-free flavored water (i.e. Fruit H2O, Propel) ?Decaffeinated coffee or tea (sugar-free) ?Madalen Gavin Lite, Wyler?s Lite, Min[place] Maid Lite ?Sugar-free Jell-O ?Bouillon or broth ?Sugar-free Popsicle:   *Less than 20 calories each; Limit 1 per day ? ?Full Liquids: ?Protein Shakes/Drinks + 2 choices per day of other full liquids ?Full liquids must be: ?No More Than 12 grams of Carbs per serving  ?No More Than 3 grams of Fat  per serving ?Strained low-fat cream soup ?Non-Fat milk ?Fat-free Lactaid Milk ?Sugar-free yogurt (Dannon Lite & Fit, Greek yogurt) ? ? ? ?  ?Vitamins and Minerals Start 1 day after surgery unless otherwise directed by your surgeon ?Bariatric Specific Complete Multivitamins ?Chewable Calcium Citrate with Vitamin D-3 ?(Example: 3 Chewable Calcium Plus 600 with Vitamin D-3) ?Take 500 mg three (3) times a day for a total of 1500 mg each day ?Do not take all 3 doses of calcium at one time as it may cause constipation, and you can only absorb 500 mg  at a time  ?Do not mix multivitamins containing iron with calcium supplements; take 2 hours apart ? ?Menstruating women and those at risk for anemia (a blood disease that causes weakness) may need extra iron ?Talk with your doctor to see if you need more iron ?If you need extra iron: Total daily Iron recommendation (including Vitamins) is 50 to 100   mg Iron/day ?Do not stop taking or change any vitamins or minerals until you talk to your nutritionist or surgeon ?Your nutritionist and/or surgeon must approve all vitamin and mineral supplements ?  ?Activity and Exercise: It is important to continue walking at home.  Limit your physical activity as instructed by your doctor.  During this time, use these guidelines: ?Do not lift anything greater than ten (10) pounds for at least two (2) weeks ?Do not go back to work or drive until your surgeon says you can ?You may have sex when you feel comfortable  ?It is VERY important for female patients to use a reliable birth control method; fertility often increases after surgery  ?Do not get pregnant for at least 18 months ?Start exercising as soon as your doctor tells you that you can ?Make sure your doctor approves any physical activity ?Start with a simple walking program ?Walk 5-15 minutes each day, 7 days per week.  ?Slowly increase until you are walking 30-45 minutes per day ?Consider joining our BELT program. [phone] or email  [email or web address] ?  ?Special Instructions Things to remember: ? ?Use your CPAP when sleeping if this applies to you, do not stop the use of CPAP unless directed by physician after a sleep study ?[place]

## 2021-08-22 NOTE — Progress Notes (Signed)
Pt refused cpap tonight.  Pt was advised that RT is available all night should she change her mind. 

## 2021-08-23 ENCOUNTER — Other Ambulatory Visit (HOSPITAL_COMMUNITY): Payer: Self-pay

## 2021-08-23 LAB — CBC WITH DIFFERENTIAL/PLATELET
Abs Immature Granulocytes: 0.04 10*3/uL (ref 0.00–0.07)
Basophils Absolute: 0 10*3/uL (ref 0.0–0.1)
Basophils Relative: 0 %
Eosinophils Absolute: 0 10*3/uL (ref 0.0–0.5)
Eosinophils Relative: 0 %
HCT: 38.3 % (ref 36.0–46.0)
Hemoglobin: 12.7 g/dL (ref 12.0–15.0)
Immature Granulocytes: 0 %
Lymphocytes Relative: 20 %
Lymphs Abs: 1.9 10*3/uL (ref 0.7–4.0)
MCH: 30.2 pg (ref 26.0–34.0)
MCHC: 33.2 g/dL (ref 30.0–36.0)
MCV: 91 fL (ref 80.0–100.0)
Monocytes Absolute: 0.9 10*3/uL (ref 0.1–1.0)
Monocytes Relative: 9 %
Neutro Abs: 7 10*3/uL (ref 1.7–7.7)
Neutrophils Relative %: 71 %
Platelets: 202 10*3/uL (ref 150–400)
RBC: 4.21 MIL/uL (ref 3.87–5.11)
RDW: 14.1 % (ref 11.5–15.5)
WBC: 9.8 10*3/uL (ref 4.0–10.5)
nRBC: 0 % (ref 0.0–0.2)

## 2021-08-23 LAB — SURGICAL PATHOLOGY

## 2021-08-23 NOTE — Discharge Summary (Signed)
?Patient ID: ?Charlotte Meyer ?[id number] ?56 y.o. ?[birth year] ? ?08/21/2021 ? ?Discharge date and time: 08/23/2021 ? ?Admitting Physician: Charlotte Meyer ? ?Discharge Physician: Charlotte Meyer ? ?Admission Diagnoses: Morbid obesity with BMI of 50.0-59.9, adult ([place]) [E66.01, Z68.43] ?Patient Active Problem List  ? Diagnosis Date Noted  ? Morbid obesity with BMI of 50.0-59.9, adult ([place]) 08/21/2021  ? Acute upper back pain 06/13/2017  ? Vaginitis 01/31/2017  ? Acute low back pain without sciatica 01/31/2017  ? Posterior neck pain 01/31/2017  ? Right knee pain 01/31/2017  ? Cramps, extremity 12/06/2016  ? Piriformis syndrome 12/06/2016  ? Cellulitis, umbilical 63/87/5643  ? Heat intolerance 10/31/2016  ? Allergic rhinitis 10/31/2016  ? Chronic pain of both knees 10/31/2016  ? Symptomatic cholelithiasis 09/12/2015  ? Cystitis 09/29/2014  ? Vitamin D deficiency 09/29/2014  ? Pain in joint, shoulder region 08/13/2013  ? Chronic venous insufficiency 11/06/2012  ? Stasis dermatitis of both legs 11/06/2012  ? Lumbago 11/06/2012  ? Vitamin B 12 deficiency 08/05/2012  ? Fatigue 08/05/2012  ? Well adult exam 01/30/2011  ? Anemia 01/30/2011  ? ABDOMINAL PAIN-RUQ 12/14/2009  ? ABNORMAL EXAM-BILIARY TRACT 12/14/2009  ? LACTOSE INTOLERANCE 10/31/2009  ? Abdominal pain, epigastric 10/31/2009  ? ELBOW PAIN 03/31/2009  ? TOBACCO USE, QUIT 03/31/2009  ? SWELLING MASS OR LUMP IN HEAD AND NECK 01/29/2008  ? SINUSITIS- ACUTE-NOS 01/08/2008  ? KNEE PAIN 06/24/2007  ? ANXIETY 03/11/2007  ? PARESTHESIA 03/11/2007  ? ALLERGIC RHINITIS 01/03/2007  ? GERD 01/03/2007  ? Dyslipidemia 06/25/2006  ? Obesity 06/25/2006  ? DEPRESSION 06/25/2006  ? Osteoarthritis 06/25/2006  ? ? ? ?Discharge Diagnoses: Morbid obesity ?Patient Active Problem List  ? Diagnosis Date Noted  ? Morbid obesity with BMI of 50.0-59.9, adult ([place]) 08/21/2021  ? Acute upper back pain 06/13/2017  ? Vaginitis 01/31/2017  ? Acute low back pain without sciatica 01/31/2017   ? Posterior neck pain 01/31/2017  ? Right knee pain 01/31/2017  ? Cramps, extremity 12/06/2016  ? Piriformis syndrome 12/06/2016  ? Cellulitis, umbilical 32/95/1884  ? Heat intolerance 10/31/2016  ? Allergic rhinitis 10/31/2016  ? Chronic pain of both knees 10/31/2016  ? Symptomatic cholelithiasis 09/12/2015  ? Cystitis 09/29/2014  ? Vitamin D deficiency 09/29/2014  ? Pain in joint, shoulder region 08/13/2013  ? Chronic venous insufficiency 11/06/2012  ? Stasis dermatitis of both legs 11/06/2012  ? Lumbago 11/06/2012  ? Vitamin B 12 deficiency 08/05/2012  ? Fatigue 08/05/2012  ? Well adult exam 01/30/2011  ? Anemia 01/30/2011  ? ABDOMINAL PAIN-RUQ 12/14/2009  ? ABNORMAL EXAM-BILIARY TRACT 12/14/2009  ? LACTOSE INTOLERANCE 10/31/2009  ? Abdominal pain, epigastric 10/31/2009  ? ELBOW PAIN 03/31/2009  ? TOBACCO USE, QUIT 03/31/2009  ? SWELLING MASS OR LUMP IN HEAD AND NECK 01/29/2008  ? SINUSITIS- ACUTE-NOS 01/08/2008  ? KNEE PAIN 06/24/2007  ? ANXIETY 03/11/2007  ? PARESTHESIA 03/11/2007  ? ALLERGIC RHINITIS 01/03/2007  ? GERD 01/03/2007  ? Dyslipidemia 06/25/2006  ? Obesity 06/25/2006  ? DEPRESSION 06/25/2006  ? Osteoarthritis 06/25/2006  ? ? ?Operations: Procedure(s): ?ROBOTIC GASTRIC BYPASS ROUX-EN-Y WITH UPPER ENDOSCOPY AND SMALL BOWEL RESECTION ?HERNIA REPAIR UMBILICAL ADULT ? ?Admission Condition: good ? ?Discharged Condition: good ? ?Indication for Admission: Morbid obesity ? ?Hospital Course: Ms. Charlotte Meyer presented for robotic gastric bypass with umbilical hernia repair.  She recovered per protocol and was discharged. ? ?Consults: None ? ?Significant Diagnostic Studies: None ? ?Treatments: surgery: as above ? ?Disposition: Home ? ?Patient Instructions:  ?Allergies as of 08/23/2021   ? ?  Reactions  ? Methocarbamol Itching  ? Penicillins   ? Yeast infections ?Has patient had a PCN reaction causing immediate rash, facial/tongue/throat swelling, SOB or lightheadedness with hypotension: No ?Has patient had a PCN  reaction causing severe rash involving mucus membranes or skin necrosis: No ?Has patient had a PCN reaction that required hospitalization No ?Has patient had a PCN reaction occurring within the last 10 years: No ?If all of the above answers are "NO", then may proceed with Cephalosporin use.  ? Tramadol Other (See Comments)  ? Loopy and confused  ? Latex Rash  ? ?  ? ?  ?Medication List  ?  ? ?TAKE these medications   ? ?acetaminophen 650 MG CR tablet ?Commonly known as: TYLENOL ?Take 500 mg by mouth every 8 (eight) hours as needed for pain. 2 tablets ?What changed: Another medication with the same name was added. Make sure you understand how and when to take each. ?  ?acetaminophen 500 MG tablet ?Commonly known as: TYLENOL ?Take 2 tablets (1,000 mg total) by mouth every 8 (eight) hours for 5 days. ?What changed: You were already taking a medication with the same name, and this prescription was added. Make sure you understand how and when to take each. ?  ?albuterol 108 (90 Base) MCG/ACT inhaler ?Commonly known as: VENTOLIN HFA ?Inhale 2 puffs into the lungs every 6 (six) hours as needed for wheezing. ?  ?ALPRAZolam 0.25 MG dissolvable tablet ?Commonly known as: NIRAVAM ?Take 0.25 mg by mouth daily as needed for anxiety. ?  ?NON FORMULARY ?Pt uses a cpap nightly ?  ?ondansetron 4 MG disintegrating tablet ?Commonly known as: ZOFRAN-ODT ?Take 1 tablet (4 mg total) by mouth every 6 (six) hours as needed for nausea or vomiting. ?  ?orphenadrine 100 MG tablet ?Commonly known as: NORFLEX ?Take 100 mg by mouth 2 (two) times daily as needed for muscle spasms. ?  ?oxyCODONE 5 MG immediate release tablet ?Commonly known as: Oxy IR/ROXICODONE ?Take 1 tablet (5 mg total) by mouth every 6 (six) hours as needed for severe pain. ?  ?pantoprazole 40 MG tablet ?Commonly known as: PROTONIX ?Take 1 tablet (40 mg total) by mouth daily. ?What changed:  ?when to take this ?reasons to take this ?  ? ?  ? ? ?Activity: no heavy lifting for 4  weeks ?Diet:  bariatric diet protocol ?Wound Care: keep wound clean and dry ? ?Follow-up:  With Dr. Thermon Leyland in 4 weeks. ? ?Signed: ?Charlotte Major Airon Sahni ?General, Bariatric, & Minimally Invasive Surgery ?[facility], [place] ? ? ?08/23/2021, 1:29 PM ? ?

## 2021-08-23 NOTE — Progress Notes (Signed)
Progress Note: General Surgery Service  ? ?Chief Complaint/Subjective: ?Spit up tylenol yesterday, some dysphagia with pain in her chest as things enter her abdomen. ? ?Objective: ?Vital signs in last 24 hours: ?Temp:  [98.8 ?F (37.1 ?C)-99.5 ?F (37.5 ?C)] 99.3 ?F (37.4 ?C) (03/16 0531) ?Pulse Rate:  [88-99] 88 (03/16 0531) ?Resp:  [16-18] 18 (03/16 0531) ?BP: (145-189)/(84-101) 172/87 (03/16 0531) ?SpO2:  [97 %-100 %] 98 % (03/16 0531) ?Last BM Date : 08/20/21 ? ?Intake/Output from previous day: ?03/15 0701 - 03/16 0700 ?In: 2050.8 [P.O.:252; I.V.:1798.8] ?Out: 8675 [Urine:4430; Emesis/NG output:60] ?Intake/Output this shift: ?No intake/output data recorded. ? ?GI: Abd incisions c/d/I w/ glue ? ?Lab Results: ?CBC  ?Recent Labs  ?  08/22/21 ?0420 08/23/21 ?0434  ?WBC 10.8* 9.8  ?HGB 12.7 12.7  ?HCT 38.9 38.3  ?PLT 198 202  ? ? ?BMET ?Recent Labs  ?  08/21/21 ?1454  ?CREATININE 0.52  ? ? ?PT/INR ?No results for input(s): LABPROT, INR in the last 72 hours. ?ABG ?No results for input(s): PHART, HCO3 in the last 72 hours. ? ?Invalid input(s): PCO2, PO2 ? ?Anti-infectives: ?Anti-infectives (From admission, onward)  ? ? Start     Dose/Rate Route Frequency Ordered Stop  ? 08/21/21 0600  cefoTEtan (CEFOTAN) 2 g in sodium chloride 0.9 % 100 mL IVPB       ? 2 g ?200 mL/hr over 30 Minutes Intravenous On call to O.R. 08/21/21 4492 08/21/21 0757  ? ?  ? ? ?Medications: ?Scheduled Meds: ? acetaminophen  1,000 mg Oral Q8H  ? Or  ? acetaminophen (TYLENOL) oral liquid 160 mg/5 mL  1,000 mg Oral Q8H  ? enoxaparin (LOVENOX) injection  30 mg Subcutaneous Q12H  ? pantoprazole (PROTONIX) IV  40 mg Intravenous QHS  ? Ensure Max Protein  2 oz Oral Q2H  ? ?Continuous Infusions: ? lactated ringers 75 mL/hr at 08/23/21 0352  ? ?PRN Meds:.albuterol, ALPRAZolam, lip balm, morphine injection, ondansetron (ZOFRAN) IV, oxyCODONE, simethicone ? ?Assessment/Plan: ?s/p Procedure(s): ?ROBOTIC GASTRIC BYPASS ROUX-EN-Y WITH UPPER ENDOSCOPY AND SMALL  BOWEL RESECTION ?HERNIA REPAIR UMBILICAL ADULT 0/03/711 ? ?Doing well POD2 ?Continue protocol ?Check PO intake this afternoon.  If liquids going down easier, consider discharge this afternoon vs. tomorrow. ?Abdominal binder ? ? LOS: 2 days  ? ? ?Felicie Morn, MD ? ?[facility] ?Use [email or web address] to contact on call provider ? ?Daily Billing: ?[phone] - post op ? ? ?

## 2021-08-23 NOTE — Plan of Care (Signed)
?  Problem: Education: ?Goal: Knowledge of General Education information will improve ?Description: Including pain rating scale, medication(s)/side effects and non-pharmacologic comfort measures ?Outcome: Completed/Met ?  ?Problem: Health Behavior/Discharge Planning: ?Goal: Ability to manage health-related needs will improve ?Outcome: Completed/Met ?  ?Problem: Clinical Measurements: ?Goal: Ability to maintain clinical measurements within normal limits will improve ?Outcome: Completed/Met ?Goal: Will remain free from infection ?Outcome: Completed/Met ?Goal: Diagnostic test results will improve ?Outcome: Completed/Met ?Goal: Respiratory complications will improve ?Outcome: Completed/Met ?Goal: Cardiovascular complication will be avoided ?Outcome: Completed/Met ?  ?Problem: Activity: ?Goal: Risk for activity intolerance will decrease ?Outcome: Completed/Met ?  ?Problem: Nutrition: ?Goal: Adequate nutrition will be maintained ?Outcome: Completed/Met ?  ?Problem: Coping: ?Goal: Level of anxiety will decrease ?Outcome: Completed/Met ?  ?Problem: Elimination: ?Goal: Will not experience complications related to bowel motility ?Outcome: Completed/Met ?Goal: Will not experience complications related to urinary retention ?Outcome: Completed/Met ?  ?Problem: Pain Managment: ?Goal: General experience of comfort will improve ?Outcome: Completed/Met ?  ?Problem: Safety: ?Goal: Ability to remain free from injury will improve ?Outcome: Completed/Met ?  ?Problem: Skin Integrity: ?Goal: Risk for impaired skin integrity will decrease ?Outcome: Completed/Met ?  ?Problem: Education: ?Goal: Ability to state signs and symptoms to report to health care provider will improve ?Outcome: Completed/Met ?Goal: Knowledge of the prescribed self-care regimen will improve ?Outcome: Completed/Met ?Goal: Knowledge of discharge needs will improve ?Outcome: Completed/Met ?  ?Problem: Activity: ?Goal: Ability to tolerate increased activity will  improve ?Outcome: Completed/Met ?  ?Problem: Bowel/Gastric: ?Goal: Gastrointestinal status for postoperative course will improve ?Outcome: Completed/Met ?Goal: Occurrences of nausea will decrease ?Outcome: Completed/Met ?  ?Problem: Coping: ?Goal: Development of coping mechanisms to deal with changes in body function or appearance will improve ?Outcome: Completed/Met ?  ?Problem: Fluid Volume: ?Goal: Maintenance of adequate hydration will improve ?Outcome: Completed/Met ?  ?Problem: Nutritional: ?Goal: Nutritional status will improve ?Outcome: Completed/Met ?  ?Problem: Clinical Measurements: ?Goal: Will show no signs or symptoms of venous thromboembolism ?Outcome: Completed/Met ?Goal: Will remain free from infection ?Outcome: Completed/Met ?Goal: Will show no signs of GI Leak ?Outcome: Completed/Met ?  ?Problem: Respiratory: ?Goal: Will regain and/or maintain adequate ventilation ?Outcome: Completed/Met ?  ?Problem: Pain Management: ?Goal: Pain level will decrease ?Outcome: Completed/Met ?  ?Problem: Skin Integrity: ?Goal: Demonstration of wound healing without infection will improve ?Outcome: Completed/Met ?  ?

## 2021-08-24 ENCOUNTER — Other Ambulatory Visit (HOSPITAL_COMMUNITY): Payer: Self-pay

## 2021-08-24 ENCOUNTER — Telehealth (HOSPITAL_COMMUNITY): Payer: Self-pay | Admitting: *Deleted

## 2021-08-24 NOTE — Progress Notes (Signed)
24hr fluid recall prior to discharge: 355m.  Per dehydration protocol, will call pt to f/u within one week post op. ?

## 2021-08-24 NOTE — Telephone Encounter (Signed)
1.  Tell me about your pain and pain management? ?Pt c/o right lower abdominal incisional pain with movement and exertion. Discussed with patient to try and splint her abdomen with changing positions to assist with the discomfort.  Encouraged pt to try options and/or contact [facility] if still concerned.  ? ?2.  Let's talk about fluid intake.  How much total fluid are you taking in? ?Pt states that she is working to meet goal of 64 oz of fluid today.  Pt has been able to consume approx. 55oz of fluid today. Pt plans to increase clear liquids to meet fluid goals. ? ?3.  How much protein have you taken in the last 2 days? ?Pt states that she is working to meet goal of goal of 60g of protein today.  Pt has already consumed one protein shake.  Pt plans to drink remainder of protein tonight to meet goal. ? ?4.  Have you had nausea?  Tell me about when have experienced nausea and what you did to help? ?Pt denies nausea. ?  ?5.  Has the frequency or color changed with your urine? ?Pt feels that she that is not "urinating enough", but states that she has urinated at least 5/6x today and her urine is clear.    ?  ?6.  Tell me what your incisions look like? ?"Incisions look fine". Pt denies a fever, chills.  Pt states incisions are not swollen, open, or draining.  Pt encouraged to call [facility] if incisions change. ?  ?7.  Have you been passing gas? BM? ?Pt states that she has not had a BM.  Pt instructed to take either Miralax or MoM as instructed per "Gastric Bypass/Sleeve Discharge Home Care Instructions".  Pt to call surgeon's office if not able to have BM with medication. ?  ?8.  If a problem or question were to arise who would you call?  Do you know contact numbers for [facility], [facility], and [facility]? ?Pt denies dehydration symptoms.  Pt can describe s/sx of dehydration.  Pt knows to call [facility] for surgical, [facility] for nutrition, and [facility] for non-urgent questions or concerns. ?  ?9.  How has the walking going? ?Pt states she is walking around and  able to be active without difficulty. ?  ?10. Are you still using your incentive spirometer?  If so, how often?  ?Pt states that she is using her I.S.Pt encouraged to use incentive spirometer, at least 10x every hour while awake until she sees the surgeon. ? ?11.  How are your vitamins and calcium going?  How are you taking them? ?Pt states that she will begin the supplements tomorrow.  Reinforced education about taking supplements at least two hours apart. ? ?Reminded patient that the first 30 days post-operatively are important for successful recovery.  Practice good hand hygiene, wearing a mask when appropriate (since optional in most places), and minimizing exposure to people who live outside of the home, especially if they are exhibiting any respiratory, GI, or illness-like symptoms.   ? ?

## 2021-08-24 NOTE — Telephone Encounter (Signed)
Pt still in bed during call.  Encouraged pt to get up, get dressed, and start drinking protein and fluids.  Pt states that she is "sore" and it was uncomfortable during the night when she was changing positions. Pt has not had a BM, so instructed pt to take Miralax today along with starting protein and fluid. Pt left discharge folder at hospital.  Will email pt discharge instructions, verified email address. ? ?Will f/u with pt later this afternoon to discuss hydration status/plan. ? ? ? ?

## 2021-09-04 ENCOUNTER — Other Ambulatory Visit: Payer: Self-pay

## 2021-09-04 ENCOUNTER — Encounter: Payer: Federal, State, Local not specified - PPO | Attending: Surgery | Admitting: Skilled Nursing Facility1

## 2021-09-04 DIAGNOSIS — E669 Obesity, unspecified: Secondary | ICD-10-CM | POA: Insufficient documentation

## 2021-09-05 NOTE — Progress Notes (Signed)
2 Week Post-Operative Nutrition Class ?  ?Patient was seen on 09/04/2021 for Post-Operative Nutrition education at the [facility].  ?  ?Surgery date: 08/21/2021 ?Surgery type: Gastric Bypass Roux-En-Y ?Start weight at [facility]: 334.3 ?Weight today: 303.0 ?Bowel Habits: Every day to every other day no complaints ?  ?Body Composition Scale Date ?09/04/2021  ?Current Body Weight 303.0  ?Total Body Fat % 49.8  ?Visceral Fat 23  ?Fat-Free Mass % 50.1  ? Total Body Water % 39.5  ?Muscle-Mass lbs 31.7  ?BMI 50.1  ?Body Fat Displacement   ?       Torso  lbs 93.8  ?       Left Leg  lbs 18.7  ?       Right Leg  lbs 18.7  ?       Left Arm  lbs 9.3  ?       Right Arm   lbs 9.3  ? ? ?  ?The following the learning objectives were met by the patient during this course: ?Identifies Phase 3 (Soft, High Proteins) Dietary Goals and will begin from 2 weeks post-operatively to 2 months post-operatively ?Identifies appropriate sources of fluids and proteins  ?Identifies appropriate fat sources and healthy verses unhealthy fat types   ?States protein recommendations and appropriate sources post-operatively ?Identifies the need for appropriate texture modifications, mastication, and bite sizes when consuming solids ?Identifies appropriate fat consumption and sources ?Identifies appropriate multivitamin and calcium sources post-operatively ?Describes the need for physical activity post-operatively and will follow MD recommendations ?States when to call healthcare provider regarding medication questions or post-operative complications ?  ?Handouts given during class include: ?Phase 3A: Soft, High Protein Diet Handout ?Phase 3 High Protein Meals ?Healthy Fats ?  ?Follow-Up Plan: ?Patient will follow-up at [facility] in 6 weeks for 2 month post-op nutrition visit for diet advancement per MD.  ?

## 2021-09-11 ENCOUNTER — Telehealth: Payer: Self-pay | Admitting: Skilled Nursing Facility1

## 2021-09-11 NOTE — Telephone Encounter (Signed)
RD called pt to verify fluid intake once starting soft, solid proteins 2 week post-bariatric surgery.  ? ?Daily Fluid intake: 48-50 ?Daily Protein intake: 60 ?Bowel Habits: little once or twice day ? ?Concerns/issues:  used to using a straw and avoiding using.  Increase input with green tea to increase fluid intake ?

## 2021-10-10 ENCOUNTER — Encounter: Payer: Federal, State, Local not specified - PPO | Attending: Surgery | Admitting: Skilled Nursing Facility1

## 2021-10-10 DIAGNOSIS — Z6841 Body Mass Index (BMI) 40.0 and over, adult: Secondary | ICD-10-CM | POA: Diagnosis present

## 2021-10-10 NOTE — Progress Notes (Signed)
Bariatric Nutrition Follow-Up Visit ?Medical Nutrition Therapy  ? ? ?Surgery date: 08/21/2021 ?Surgery type: Gastric Bypass Roux-En-Y ?Start weight at [facility]: 334.3 ?Weight today: virtual appt: pt identified by name and DOB; pt agreeable to the limitations of this visit type ?  ?Body Composition Scale Date ?09/04/2021  ?Current Body Weight 303.0  ?Total Body Fat % 49.8  ?Visceral Fat 23  ?Fat-Free Mass % 50.1  ? Total Body Water % 39.5  ?Muscle-Mass lbs 31.7  ?BMI 50.1  ?Body Fat Displacement   ?       Torso  lbs 93.8  ?       Left Leg  lbs 18.7  ?       Right Leg  lbs 18.7  ?       Left Arm  lbs 9.3  ?       Right Arm   lbs 9.3  ? ? ?Clinical  ?Medical hx: reflux ?Medications: see list  ?Labs: LDL 142, vitamin D 19 ?Notable signs/symptoms: knee pain  ?Any previous deficiencies? Yes: vitamin D, iron  ?  ?Lifestyle & Dietary Hx ? ?Pt states she cannot tolerate chicken, ground beef but can tolerate fish and seafood and beans and eggs and cheese: Dietitian advised pt this is normal and she can still meet her protein needs with the variety of options she can tolerate.  ?Pt states it is hard for her to drink water due to being back at work due to forgetting to drink.   ?Pt states she ate chicken alfredo the other day which went fine but does not want to keep trying chicken just in case she cannot tolerate it.  ? ?Advised pt it was fairly usual for pts to not be able to tolerate animal based protein but since she CAN tolerate: seafood and plant. Dairy, and beans she will be able to advance apropriatly.  ? ?Pt States she will come again next week: Dietitian advised pt due to having this appt today she will not need to come next week.  ? ?Estimated daily fluid intake: 32-40 oz ?Estimated daily protein intake: 80 g ?Supplements: multi and calcium  ?Current average weekly physical activity: walking, treadmill, biking: 2-3 days a week for about 45-60 minutes; some band for arms and legs ? ?24-Hr Dietary Recall ?First Meal 8am:  protein shake ?Snack:  p3: nuts and cheese ?Second Meal 12: tuna ?Snack:   ?Third Meal: protein shake ?Snack:  ?Beverages: water, coffee ? ?Post-Op Goals/ Signs/ Symptoms ?Using straws: no ?Drinking while eating: no ?Chewing/swallowing difficulties: no ?Changes in vision: no ?Changes to mood/headaches: no ?Hair loss/changes to skin/nails: no ?Difficulty focusing/concentrating: no ?Sweating: no ?Limb weakness: no ?Dizziness/lightheadedness: no ?Palpitations: no  ?Carbonated/caffeinated beverages: no ?N/V/D/C/Gas: constipation  ?Abdominal pain: no ?Dumping syndrome: no ? ?  ?NUTRITION DIAGNOSIS  ?Overweight/obesity ([place]-3.3) related to past poor dietary habits and physical inactivity as evidenced by completed bariatric surgery and following dietary guidelines for continued weight loss and healthy nutrition status. ?  ?  ?NUTRITION INTERVENTION ?Nutrition counseling (C-1) and education (E-2) to facilitate bariatric surgery goals, including: ?Diet advancement to the next phase (phase 4) now including non starchy vegetables ?The importance of consuming adequate calories as well as certain nutrients daily due to the body's need for essential vitamins, minerals, and fats ?The importance of daily physical activity and to reach a goal of at least 150 minutes of moderate to vigorous physical activity weekly (or as directed by their physician) due to benefits such as increased musculature and  improved lab values ?The importance of intuitive eating specifically learning hunger-satiety cues and understanding the importance of learning a new body: The importance of mindful eating to avoid grazing behaviors  ?RE-Educated pt on other fluid options to help her get in her fluid goal ? ? ?Goals: ?-Continue to aim for a minimum of 64 fluid ounces 7 days a week with at least 30 ounces being plain water ? ?-Eat non-starchy vegetables 2 times a day 7 days a week ? ?-Start out with soft cooked vegetables today and tomorrow; if tolerated  begin to eat raw vegetables or cooked including salads ? ?-Eat your 3 ounces of protein first then start in on your non-starchy vegetables; once you understand how much of your meal leads to satisfaction and not full while still eating 3 ounces of protein and non-starchy vegetables you can eat them in any order  ? ?-Continue to aim for 30 minutes of activity at least 5 times a week ? ?-Do NOT cook with/add to your food: alfredo sauce, cheese sauce, barbeque sauce, ketchup, fat back, butter, bacon grease, grease, Crisco, OR SUGAR ? ?-avoid animal based proteins only do seafood, plant based, and supplemtns for now until others are better tolerated ? ?-switch to the capsule multivitamin  ? ?Handouts Provided Include  ?Phase 4 emailed  ? ?Learning Style & Readiness for Change ?Teaching method utilized: Visual & Auditory  ?Demonstrated degree of understanding via: Teach Back  ?Readiness Level: action ?Barriers to learning/adherence to lifestyle change: worry with not being able to tolerate certain foods ? ?RD's Notes for Next Visit ?Assess adherence to pt chosen goals  ? ? ?MONITORING & EVALUATION ?Dietary intake, weekly physical activity, body weight ? ?Next Steps ?Patient is to follow-up in 3-4 months ?

## 2021-10-16 ENCOUNTER — Telehealth: Payer: Self-pay | Admitting: Skilled Nursing Facility1

## 2021-10-16 NOTE — Telephone Encounter (Signed)
At this point in the phases you are limited to non-starchy vegetables and lean proteins excluded the foods in your email as well as fruits which cuts out smoothies. You are limited to only the foods in the protein handout and the non-starchy vegetables. This is a strict diet resulting in rapid weight loss. We will add those foods back in eventually as the body needs complex carbohydrates but not just yet.  ? It is okay that you cannot eat raw spinach.  ? ?Does that make sense?  ? ?From: Charlotte Meyer [email or web address]>  ?Sent: Tuesday, Oct 16, 2021 12:25 PM ?To: Charlotte Meyer '@[email or web address]> ?Subject: Re: phase 4 handout ? ?*Caution - External email - see footer for warnings* ?Good afternoon Charlotte Meyer, ?Hope all is well. Am I able to eat grits, granola or oats. Am I  able to do smoothies if so what do you suggest I try? I can't eat raw spinach. ? ? ?

## 2021-10-18 ENCOUNTER — Ambulatory Visit: Payer: Federal, State, Local not specified - PPO | Admitting: Skilled Nursing Facility1

## 2022-01-15 ENCOUNTER — Encounter: Payer: Federal, State, Local not specified - PPO | Attending: Surgery

## 2022-01-15 DIAGNOSIS — Z713 Dietary counseling and surveillance: Secondary | ICD-10-CM | POA: Insufficient documentation

## 2022-01-15 DIAGNOSIS — Z6841 Body Mass Index (BMI) 40.0 and over, adult: Secondary | ICD-10-CM | POA: Insufficient documentation

## 2022-01-22 ENCOUNTER — Ambulatory Visit: Payer: Federal, State, Local not specified - PPO | Admitting: Skilled Nursing Facility1

## 2022-01-22 ENCOUNTER — Encounter: Payer: Self-pay | Admitting: Skilled Nursing Facility1

## 2022-01-22 DIAGNOSIS — Z6841 Body Mass Index (BMI) 40.0 and over, adult: Secondary | ICD-10-CM | POA: Diagnosis not present

## 2022-01-22 DIAGNOSIS — Z713 Dietary counseling and surveillance: Secondary | ICD-10-CM | POA: Diagnosis not present

## 2022-01-22 DIAGNOSIS — E669 Obesity, unspecified: Secondary | ICD-10-CM | POA: Diagnosis present

## 2022-01-22 NOTE — Progress Notes (Signed)
Bariatric Nutrition Follow-Up Visit Medical Nutrition Therapy    Surgery date: 08/21/2021 Surgery type: Gastric Bypass Roux-En-Y Start weight at [facility]: 334.3 Weight today: 247.3 pounds   Body Composition Scale Date 09/04/2021 01/22/2022  Current Body Weight 303.0 247.3  Total Body Fat % 49.8 45  Visceral Fat 23 16  Fat-Free Mass % 50.1 54.9   Total Body Water % 39.5 41.9  Muscle-Mass lbs 31.7 31.7  BMI 50.1 39.9  Body Fat Displacement           Torso  lbs 93.8 69.1         Left Leg  lbs 18.7 13.8         Right Leg  lbs 18.7 13.8         Left Arm  lbs 9.3 6.9         Right Arm   lbs 9.3 6.9    Clinical  Medical hx: reflux Medications: see list  Labs: Potassium 3.3, LDL cholesterol 126, Vitamin D 29 Notable signs/symptoms: knee pain  Any previous deficiencies? Yes: vitamin D, iron    Lifestyle & Dietary Hx   Pt states her goal is to lose 50 pounds by her one year. Pt states she wears a gurtle to help with her abdominal lose skin.  Pt state her energy level is pretty good but sometimes does feel a little sluggish.    Estimated daily fluid intake: 32-40 oz Estimated daily protein intake: 80 g Supplements: multi and calcium  Current average weekly physical activity: walking, treadmill, biking: 2-3 days a week for about 45-60 minutes; some band for arms and legs  24-Hr Dietary Recall First Meal 8am: coffee + creamer + smoked salmon + multi grain bread with guac  Snack:  half a protein shake Second Meal 12: tuna or chicken and steamed vegetables  Snack:  sugar free outshine popcicle Third Meal: fish or shrimp + baked beans + green beans Snack:  Beverages: water, coffee, herbal tea  Post-Op Goals/ Signs/ Symptoms Using straws: no Drinking while eating: no Chewing/swallowing difficulties: no Changes in vision: no Changes to mood/headaches: no Hair loss/changes to skin/nails: no Difficulty focusing/concentrating: no Sweating: no Limb weakness:  no Dizziness/lightheadedness: no Palpitations: no  Carbonated/caffeinated beverages: no N/V/D/C/Gas: constipation  Abdominal pain: no Dumping syndrome: no    NUTRITION DIAGNOSIS  Overweight/obesity ([place]-3.3) related to past poor dietary habits and physical inactivity as evidenced by completed bariatric surgery and following dietary guidelines for continued weight loss and healthy nutrition status.     NUTRITION INTERVENTION Nutrition counseling (C-1) and education (E-2) to facilitate bariatric surgery goals, including: The importance of consuming adequate calories as well as certain nutrients daily due to the body's need for essential vitamins, minerals, and fats The importance of daily physical activity and to reach a goal of at least 150 minutes of moderate to vigorous physical activity weekly (or as directed by their physician) due to benefits such as increased musculature and improved lab values The importance of intuitive eating specifically learning hunger-satiety cues and understanding the importance of learning a new body: The importance of mindful eating to avoid grazing behaviors  RE-Educated pt on other fluid options to help her get in her fluid goal Encouraged patient to honor their body's internal hunger and fullness cues.  Throughout the day, check in mentally and rate hunger. Stop eating when satisfied not full regardless of how much food is left on the plate.  Get more if still hungry 20-30 minutes later.  The key is  to honor satisfaction so throughout the meal, rate fullness factor and stop when comfortably satisfied not physically full. The key is to honor hunger and fullness without any feelings of guilt or shame.  Pay attention to what the internal cues are, rather than any external factors. This will enhance the confidence you have in listening to your own body and following those internal cues enabling you to increase how often you eat when you are hungry not out of appetite  and stop when you are satisfied not full.  Encouraged pt to continue to eat balanced meals inclusive of non starchy vegetables 2 times a day 7 days a week Encouraged pt to choose lean protein sources: limiting beef, pork, sausage, hotdogs, and lunch meat Encourage pt to choose healthy fats such as plant based limiting animal fats Encouraged pt to continue to drink a minium 64 fluid ounces with half being plain water to satisfy proper hydration    Goals: -Continue to aim for a minimum of 64 fluid ounces 7 days a week with at least 30 ounces being plain water -   Handouts Provided Include  Phase 6   Learning Style & Readiness for Change Teaching method utilized: Visual & Auditory  Demonstrated degree of understanding via: Teach Back  Readiness Level: action Barriers to learning/adherence to lifestyle change: worry with not being able to tolerate certain foods  RD's Notes for Next Visit Assess adherence to pt chosen goals    MONITORING & EVALUATION Dietary intake, weekly physical activity, body weight  Next Steps Patient is to follow-up in 3-4 months

## 2022-02-12 ENCOUNTER — Telehealth: Payer: Self-pay | Admitting: Neurology

## 2022-02-12 ENCOUNTER — Other Ambulatory Visit: Payer: Self-pay | Admitting: Family Medicine

## 2022-02-12 DIAGNOSIS — Z139 Encounter for screening, unspecified: Secondary | ICD-10-CM

## 2022-02-12 NOTE — Telephone Encounter (Signed)
Pt would like a call from the nurse to discuss if need to schedule an appt for CPAP. Have not had Initial CPAP appt.

## 2022-02-13 NOTE — Telephone Encounter (Signed)
Pt said she is not using CPAP every night unless she needs it.  Pt requested appt on her next day off; 03/18/22 at 7:45 am

## 2022-02-28 ENCOUNTER — Ambulatory Visit
Admission: RE | Admit: 2022-02-28 | Discharge: 2022-02-28 | Disposition: A | Discharge status: Home / Self Care | Payer: Federal, State, Local not specified - PPO | Source: Ambulatory Visit | Attending: Family Medicine | Admitting: Family Medicine

## 2022-02-28 ENCOUNTER — Other Ambulatory Visit (HOSPITAL_COMMUNITY)
Admission: RE | Admit: 2022-02-28 | Discharge: 2022-02-28 | Disposition: A | Discharge status: Home / Self Care | Payer: Federal, State, Local not specified - PPO | Source: Ambulatory Visit

## 2022-02-28 ENCOUNTER — Ambulatory Visit (INDEPENDENT_AMBULATORY_CARE_PROVIDER_SITE_OTHER): Payer: Federal, State, Local not specified - PPO

## 2022-02-28 VITALS — BP 119/83 | HR 72 | Wt 243.0 lb

## 2022-02-28 DIAGNOSIS — N898 Other specified noninflammatory disorders of vagina: Secondary | ICD-10-CM

## 2022-02-28 DIAGNOSIS — Z01419 Encounter for gynecological examination (general) (routine) without abnormal findings: Secondary | ICD-10-CM | POA: Diagnosis not present

## 2022-02-28 DIAGNOSIS — Z1239 Encounter for other screening for malignant neoplasm of breast: Secondary | ICD-10-CM | POA: Diagnosis not present

## 2022-02-28 DIAGNOSIS — Z139 Encounter for screening, unspecified: Secondary | ICD-10-CM

## 2022-02-28 MED ORDER — METRONIDAZOLE 500 MG PO TABS: 500.0000 mg | ORAL_TABLET | Freq: Two times a day (BID) | ORAL | 0 refills | Status: DC

## 2022-02-28 NOTE — Progress Notes (Signed)
New GYN is in the office for annual Pt reports last pap in 2022 at Dr. Garwin Brothers office, reports normal results Last mammogram 02/28/22 Reports vaginal irritation, hx of BV

## 2022-02-28 NOTE — Progress Notes (Signed)
GYNECOLOGY OFFICE VISIT NOTE-WELL WOMAN EXAM  History:   Charlotte Meyer [id number] here today for well woman exam. She reports recently transferring, from a private office, due to changes in her insurance.  Charlotte Meyer reports she has been experiencing some vaginal itching and discharge that has been present for about one week. She has not attempted to treat her symptoms and reports it is "not that bad."  Charlotte Meyer reports a history of recurrent yeast and BV infection stating she has had two infections this year, but usually has more.    Birth Control:  PostMenopausal. LMP May 2022. Reports continued menopausal symptoms of hot flashes, sleep disturbances, and hair shedding.   Reproductive Concerns Sexually Active: Occasionally Partners Type: Female Number of partners in last year: One STD Testing:Declines  Vaginal/GU Concerns: Concerns as above. No pain or discomfort during sexual activity. No bleeding or pelvic pain.  She endorses constipation, but has BM daily.  She denies issues with urination.  Breast Concerns/Exams: No concerns.  Verbalizes SBA. Completed mammogram today.  She has a living maternal aunt who had breast cancer. She denies family history of uterine, cervical, or ovarian cancer.  Medical and Nutrition PCP: Charlotte Meyer. Last Appt July 2023 Significant PMx: Obesity with Gastric Bypass in March 2023 Exercise: 2-3x/week. Cardio and Core  Tobacco/Drugs/Alcohol: None Nutrition:Charlotte Meyer feels like she does "not eat enough vegetables." She also states she sometimes substitutes protein shakes for meals.   Social Safety at home: Endorses. Lives with fiance and college-aged daughter.  DV/A: Denies Social Support: Endorses Employment: Receptionist   Past Medical History:  Diagnosis Date  . Allergic rhinitis   . Anemia   . Depression    treated with zoloft in past  . GERD (gastroesophageal reflux disease)   . History of UTI   . Hyperlipemia    no treatment required  .  Lactose intolerance   . OA (osteoarthritis) of knee    bilaterally   . Obesity     Past Surgical History:  Procedure Laterality Date  . BARIATRIC SURGERY  08/21/2021  . BIOPSY  04/03/2021   Procedure: BIOPSY;  Surgeon: Otis Brace, MD;  Location: [facility];  Service: Gastroenterology;;  . BREAST BIOPSY Left   . CHOLECYSTECTOMY N/A 09/12/2015   Procedure: LAPAROSCOPIC CHOLECYSTECTOMY WITH INTRAOPERATIVE CHOLANGIOGRAM;  Surgeon: Jackolyn Confer, MD;  Location: [facility];  Service: General;  Laterality: N/A;  . COLONOSCOPY WITH PROPOFOL N/A 03/08/2016   Procedure: COLONOSCOPY WITH PROPOFOL;  Surgeon: Otis Brace, MD;  Location: [facility];  Service: Gastroenterology;  Laterality: N/A;  . COLONOSCOPY WITH PROPOFOL N/A 04/03/2021   Procedure: COLONOSCOPY WITH PROPOFOL;  Surgeon: Otis Brace, MD;  Location: [facility];  Service: Gastroenterology;  Laterality: N/A;  . ESOPHAGOGASTRODUODENOSCOPY N/A 04/03/2021   Procedure: ESOPHAGOGASTRODUODENOSCOPY (EGD);  Surgeon: Otis Brace, MD;  Location: [facility];  Service: Gastroenterology;  Laterality: N/A;  . FOOT SURGERY    . POLYPECTOMY  04/03/2021   Procedure: POLYPECTOMY;  Surgeon: Otis Brace, MD;  Location: [facility];  Service: Gastroenterology;;  . UMBILICAL HERNIA REPAIR N/A 08/21/2021   Procedure: HERNIA REPAIR UMBILICAL ADULT;  Surgeon: Felicie Morn, MD;  Location: [facility];  Service: General;  Laterality: N/A;  . WISDOM TOOTH EXTRACTION      The following portions of the patient's history were reviewed and updated as appropriate: allergies, current medications, past family history, past medical history, past social history, past surgical history and problem list.   Health Maintenance:  Normal pap and negative  HRHPV last year per patient.  Normal mammogram today.   Review of Systems:  Pertinent items noted in HPI and remainder of comprehensive ROS otherwise negative.    Objective:     Physical Exam BP 119/83   Pulse 72   Wt 243 lb (110.2 kg)   BMI 39.22 kg/m  Physical Exam Vitals reviewed. Exam conducted with a chaperone present.  Constitutional:      Appearance: Normal appearance.  HENT:     Head: Normocephalic.  Eyes:     Conjunctiva/sclera: Conjunctivae normal.  Cardiovascular:     Rate and Rhythm: Normal rate and regular rhythm.     Heart sounds: Normal heart sounds.  Pulmonary:     Effort: Pulmonary effort is normal. No respiratory distress.     Breath sounds: Normal breath sounds.  Abdominal:     Palpations: Abdomen is soft.     Tenderness: There is no abdominal tenderness.  Genitourinary:    Comments: NEFG CV collected by blind swab.  Malodor present BME with no tenderness in cul de sac. No CMT. Uterine size and position not appreciated d/t body habitus.  No apparent enlargement.  Musculoskeletal:        General: Normal range of motion.     Cervical back: Normal range of motion.  Skin:    General: Skin is warm and dry.  Neurological:     Mental Status: She is oriented to person, place, and time.  Psychiatric:        Mood and Affect: Mood normal.        Behavior: Behavior normal.     Labs and Imaging No results found for this or any previous visit (from the past 168 hour(s)). MM 3D SCREEN BREAST BILATERAL  Result Date: 02/28/2022 CLINICAL DATA:  Screening. EXAM: DIGITAL SCREENING BILATERAL MAMMOGRAM WITH TOMOSYNTHESIS AND CAD TECHNIQUE: Bilateral screening digital craniocaudal and mediolateral oblique mammograms were obtained. Bilateral screening digital breast tomosynthesis was performed. The images were evaluated with computer-aided detection. COMPARISON:  Previous exam(s). ACR Breast Density Category b: There are scattered areas of fibroglandular density. FINDINGS: There are no findings suspicious for malignancy. IMPRESSION: No mammographic evidence of malignancy. A result letter of this screening mammogram will be mailed directly to the  patient. RECOMMENDATION: Screening mammogram in one year. (Code:SM-B-01Y) BI-RADS CATEGORY  1: Negative. Electronically Signed   By: Abelardo Diesel M.D.   On: 02/28/2022 14:17     Assessment & Plan:       56 year old Female Well Woman Exam Declines Pap Postmenopausal Mammogram Completed Today Colorectal Screening UTD Vaginal Discharge/Itching/Odor  1. Well woman health examination-Pap Smear Declined -Exam performed and findings discussed. -Patient declines pap smear and instead will complete ROI for results from most previous pap last year.  -Encouraged to activate and utilize Mychart for reviewing of results, communication with office, and scheduling of appts. -Congratulations given on recent weight loss of 100lbs! -Encouragement given to continue moderate to vigorous exercise activity.  2. Encounter for screening breast examination -Educated and encouraged to initiate monthly SBE with increased breast awareness including examination of breast for skin changes, moles, tenderness, etc.   -Mammogram completed today.  3. Vaginal discharge -CV collected -Informed that odor present is c/w bacterial vaginosis. -Discussed ways to avoid BV in future including +Wear cotton underwear +Use low scent or scent free soaps and detergents +No douching -Rx for metronidazole to be sent to pharmacy on file.  - Cervicovaginal ancillary only( [place])   Routine preventative health maintenance measures  emphasized. Please refer to After Visit Summary for other counseling recommendations.   No follow-ups on file.      Maryann Conners, CNM 02/28/2022

## 2022-03-03 LAB — CERVICOVAGINAL ANCILLARY ONLY
Bacterial Vaginitis (gardnerella): NEGATIVE
Candida Glabrata: NEGATIVE
Candida Vaginitis: NEGATIVE
Comment: NEGATIVE
Comment: NEGATIVE
Comment: NEGATIVE

## 2022-03-18 ENCOUNTER — Ambulatory Visit: Payer: Medicaid Other | Admitting: Neurology

## 2022-04-11 ENCOUNTER — Other Ambulatory Visit: Payer: Self-pay

## 2022-04-12 MED ORDER — METRONIDAZOLE 500 MG PO TABS: 500.0000 mg | ORAL_TABLET | Freq: Two times a day (BID) | ORAL | 0 refills | Status: DC

## 2022-04-16 ENCOUNTER — Encounter: Payer: Federal, State, Local not specified - PPO | Attending: Surgery | Admitting: Skilled Nursing Facility1

## 2022-04-16 ENCOUNTER — Encounter: Payer: Self-pay | Admitting: Skilled Nursing Facility1

## 2022-04-16 VITALS — Ht 66.0 in | Wt 236.6 lb

## 2022-04-16 DIAGNOSIS — Z6841 Body Mass Index (BMI) 40.0 and over, adult: Secondary | ICD-10-CM | POA: Insufficient documentation

## 2022-04-16 NOTE — Progress Notes (Signed)
Bariatric Nutrition Follow-Up Visit Medical Nutrition Therapy    Surgery date: 08/21/2021 Surgery type: Gastric Bypass Roux-En-Y Start weight at [facility]: 334.3 Weight today: 236.6 pounds   Body Composition Scale Date 09/04/2021 01/22/2022 04/16/2022  Current Body Weight 303.0 247.3 236.6  Total Body Fat % 49.8 45 43.9  Visceral Fat '23 16 15  '$ Fat-Free Mass % 50.1 54.9 56   Total Body Water % 39.5 41.9 42.5  Muscle-Mass lbs 31.7 31.7 31.6  BMI 50.1 39.9 38.1  Body Fat Displacement            Torso  lbs 93.8 69.1 64.4         Left Leg  lbs 18.7 13.8 12.8         Right Leg  lbs 18.7 13.8 12.8         Left Arm  lbs 9.3 6.9 6.4         Right Arm   lbs 9.3 6.9 6.4    Clinical  Medical hx: reflux Medications: see list; currently on an antibiotic  Labs: Potassium 3.3, LDL cholesterol 126, Vitamin D 29 Notable signs/symptoms: knee pain  Any previous deficiencies? Yes: vitamin D, iron    Lifestyle & Dietary Hx   Pt states typically she has good energy. Pt states she has a sleep apnea appt upcoming.   Pt states she is really excited she can cross her legs and do her typical daily duties without worry. Pt states when she eats out she cannot tolerate the meat as well. Pt states she realizes she needs her meats moist.  Pt states she does not like water.  Pt states every time she first starts eating she has abdominal discomfort.  Pt state she tried some wine with no issues.    Estimated daily fluid intake: 40 oz Estimated daily protein intake: 80 g Supplements: multi and calcium  Current average weekly physical activity: 3 days a week 60 minutes: treadmill, elliptical, bike, resistance  24-Hr Dietary Recall First Meal 8am: egg and sausage Snack:  sugar free jello or fruit and yogurt smoothie or protein shake Second Meal 12: chicken alfredo or greens + chicken Snack:  sugar free outshine popcicle Third Meal: protein shake Snack:  Beverages:  water, coffee, 3 bottle green  tea  Post-Op Goals/ Signs/ Symptoms Using straws: no Drinking while eating: no Chewing/swallowing difficulties: no Changes in vision: no Changes to mood/headaches: no Hair loss/changes to skin/nails: no Difficulty focusing/concentrating: no Sweating: no Limb weakness: no Dizziness/lightheadedness: no Palpitations: no  Carbonated/caffeinated beverages: no N/V/D/C/Gas: 2 bowel movements a day  Abdominal pain: no Dumping syndrome: no    NUTRITION DIAGNOSIS  Overweight/obesity ([place]-3.3) related to past poor dietary habits and physical inactivity as evidenced by completed bariatric surgery and following dietary guidelines for continued weight loss and healthy nutrition status.     NUTRITION INTERVENTION: continued  Nutrition counseling (C-1) and education (E-2) to facilitate bariatric surgery goals, including: The importance of consuming adequate calories as well as certain nutrients daily due to the body's need for essential vitamins, minerals, and fats The importance of daily physical activity and to reach a goal of at least 150 minutes of moderate to vigorous physical activity weekly (or as directed by their physician) due to benefits such as increased musculature and improved lab values The importance of intuitive eating specifically learning hunger-satiety cues and understanding the importance of learning a new body: The importance of mindful eating to avoid grazing behaviors  RE-Educated pt on other fluid options to  help her get in her fluid goal Encouraged patient to honor their body's internal hunger and fullness cues.  Throughout the day, check in mentally and rate hunger. Stop eating when satisfied not full regardless of how much food is left on the plate.  Get more if still hungry 20-30 minutes later.  The key is to honor satisfaction so throughout the meal, rate fullness factor and stop when comfortably satisfied not physically full. The key is to honor hunger and fullness without  any feelings of guilt or shame.  Pay attention to what the internal cues are, rather than any external factors. This will enhance the confidence you have in listening to your own body and following those internal cues enabling you to increase how often you eat when you are hungry not out of appetite and stop when you are satisfied not full.  Encouraged pt to continue to eat balanced meals inclusive of non starchy vegetables 2 times a day 7 days a week Encouraged pt to choose lean protein sources: limiting beef, pork, sausage, hotdogs, and lunch meat Encourage pt to choose healthy fats such as plant based limiting animal fats Encouraged pt to continue to drink a minium 64 fluid ounces with half being plain water to satisfy proper hydration    Handouts Provided Include  Phase 7  Learning Style & Readiness for Change Teaching method utilized: Visual & Auditory  Demonstrated degree of understanding via: Teach Back  Readiness Level: action Barriers to learning/adherence to lifestyle change: worry with not being able to tolerate certain foods  RD's Notes for Next Visit Assess adherence to pt chosen goals    MONITORING & EVALUATION Dietary intake, weekly physical activity, body weight  Next Steps Patient is to follow-up in 3-4 months

## 2022-05-20 ENCOUNTER — Encounter: Payer: Self-pay | Admitting: *Deleted

## 2022-05-21 ENCOUNTER — Encounter: Payer: Self-pay | Admitting: Neurology

## 2022-05-21 ENCOUNTER — Ambulatory Visit (INDEPENDENT_AMBULATORY_CARE_PROVIDER_SITE_OTHER): Payer: Federal, State, Local not specified - PPO | Admitting: Neurology

## 2022-05-21 VITALS — BP 117/77 | HR 73 | Ht 65.5 in | Wt 235.4 lb

## 2022-05-21 DIAGNOSIS — R634 Abnormal weight loss: Secondary | ICD-10-CM

## 2022-05-21 DIAGNOSIS — Z9884 Bariatric surgery status: Secondary | ICD-10-CM

## 2022-05-21 DIAGNOSIS — G4733 Obstructive sleep apnea (adult) (pediatric): Secondary | ICD-10-CM | POA: Diagnosis not present

## 2022-05-21 NOTE — Patient Instructions (Signed)
It was nice to meet you today.   As discussed, we will proceed with a home sleep test (HST) to re-assess your sleep apnea and to see if you still should use your autoPAP machine. Our sleep lab staff will reach out to you to arrange for pickup and for tutorial of your test equipment - you will do the test at home that night and bring the test sensors back for data analysis the next day or whenever you are scheduled for drop off of your test equipment. I will write for a new machine after your HST confirms your obstructive sleep apnea diagnosis.   Please remember, you will not use your autoPAP machine the night of your testing.  This is so we get diagnostic data, we do not need treatment data.  We will call you with the results.   After you have done your home sleep test, you can resume using your AutoPap machine.    Please continue to work on healthy weight loss.  You have lost a significant amount of weight thus far.  Keep up the good work!

## 2022-05-21 NOTE — Progress Notes (Signed)
Subjective:    Patient ID: Charlotte Meyer is a 56 y.o. female.  HPI    Interim history:   Ms. Charlotte Meyer is a 56 year old right-handed woman with an underlying medical history of reflux disease, umbilical hernia, endometriosis, anemia, depression, allergic rhinitis, osteoarthritis, hyperlipidemia, and morbid obesity with a BMI of over 70, who presents for follow-up consultation of her obstructive sleep apnea.  The patient is unaccompanied today.  She missed an appointment on 05/08/2021.  I first met her at the request of her general surgeon, Dr. Thermon Leyland, at which time she reported snoring and difficulty falling asleep.  She was advised to proceed with a sleep study.  She had a home sleep test on 01/30/2021 which showed mild obstructive sleep apnea with a total AHI of 10.5/hour and O2 nadir of 89%.  Intermittent mild to moderate snoring was noted.  She was advised to start AutoPap therapy.  Her set up date was 04/04/2021.  She has a Personnel officer.   Today, 05/21/22: She reports not using her autoPAP consistently, she feels, she does not need it. She has used her machine 2 days in the last 30 days.  She has not really benefited from treatment.  She has lost about 100 pounds since her weight loss surgery on 08/21/21.  She feels well, she had gastric bypass, she is taking her supplements, she may need to do better with water intake but otherwise feels great.  She does not sleep very long at night, her significant other works nights.  She goes to bed between 11:30 PM and midnight and rise time is often around 5.  She works in [employer].  Her daughter is in college at [employer].  Patient would be willing to get retested with a home sleep test.  She does not drink caffeine every day.  She does like to drink Lipton green tea.  She is in the process of getting dentures.    Previously:   01/16/21: (She) reports snoring and difficulty going to sleep.  She has woken up in the recent past with a headache.  She  has nocturia about once per average night.  Her brother has sleep apnea and has a CPAP machine.  Her weight has been fluctuating.  She has a long commute.  She uses a carpool van to commute to and from work.  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  She works from 7:30 AM to 4 PM daily.  She lives with her significant other and her 73 year old daughter who is getting ready to go to college at [employer].  They have no pets at the house.  She does have a TV in her bedroom and it is on at night but without volume.  She goes to bed generally around 11 PM and rise time is around 5 AM.  She drinks no data to caffeine, drinks decaf coffee.  She currently does not drink any alcohol.  She quit smoking some 20 years ago.  She denies any gasping sensation at night, no chest pain or shortness of breath but has occasional reflux symptoms at night depending on what she ate.   I reviewed your office note from 07/04/2020.  She is being evaluated for bariatric surgery and hernia repair.  Her Epworth sleepiness score is 6 out of 24, fatigue severity score is 13 out of 63. The patient's allergies, current medications, family history, past medical history, past social history, past surgical history and problem list were reviewed and  updated as appropriate.    Her Past Medical History Is Significant For: Past Medical History:  Diagnosis Date   Allergic rhinitis    Anemia    Depression    treated with zoloft in past   GERD (gastroesophageal reflux disease)    History of UTI    Hyperlipemia    no treatment required   Lactose intolerance    OA (osteoarthritis) of knee    bilaterally    Obesity     Her Past Surgical History Is Significant For: Past Surgical History:  Procedure Laterality Date   BARIATRIC SURGERY  08/21/2021   BIOPSY  04/03/2021   Procedure: BIOPSY;  Surgeon: Otis Brace, MD;  Location: [facility];  Service: Gastroenterology;;   BREAST BIOPSY Left    CHOLECYSTECTOMY N/A 09/12/2015    Procedure: LAPAROSCOPIC CHOLECYSTECTOMY WITH INTRAOPERATIVE CHOLANGIOGRAM;  Surgeon: Jackolyn Confer, MD;  Location: [facility];  Service: General;  Laterality: N/A;   COLONOSCOPY WITH PROPOFOL N/A 03/08/2016   Procedure: COLONOSCOPY WITH PROPOFOL;  Surgeon: Otis Brace, MD;  Location: [facility];  Service: Gastroenterology;  Laterality: N/A;   COLONOSCOPY WITH PROPOFOL N/A 04/03/2021   Procedure: COLONOSCOPY WITH PROPOFOL;  Surgeon: Otis Brace, MD;  Location: [facility];  Service: Gastroenterology;  Laterality: N/A;   ESOPHAGOGASTRODUODENOSCOPY N/A 04/03/2021   Procedure: ESOPHAGOGASTRODUODENOSCOPY (EGD);  Surgeon: Otis Brace, MD;  Location: [facility];  Service: Gastroenterology;  Laterality: N/A;   FOOT SURGERY     POLYPECTOMY  04/03/2021   Procedure: POLYPECTOMY;  Surgeon: Otis Brace, MD;  Location: [facility];  Service: Gastroenterology;;   UMBILICAL HERNIA REPAIR N/A 08/21/2021   Procedure: HERNIA REPAIR UMBILICAL ADULT;  Surgeon: Felicie Morn, MD;  Location: [facility];  Service: General;  Laterality: N/A;   WISDOM TOOTH EXTRACTION      Her Family History Is Significant For: Family History  Problem Relation Age of Onset   Cancer Father        throat   Sleep apnea Brother    Breast cancer Maternal Aunt        ? age of onset   Diabetes Other    Cholelithiasis Other     Her Social History Is Significant For: Social History   Socioeconomic History   Marital status: Single    Spouse name: Not on file   Number of children: 1   Years of education: Not on file   Highest education level: Not on file  Occupational History    Employer: [employer]  Tobacco Use   Smoking status: Former   Smokeless tobacco: Never   Tobacco comments:    quit them "long time ago"  Vaping Use   Vaping Use: Never used  Substance and Sexual Activity   Alcohol use: No   Drug use: No   Sexual activity: Yes  Other Topics Concern   Not on file   Social History Narrative   Daily caffeine use   Social Determinants of Health   Financial Resource Strain: Not on file  Food Insecurity: Not on file  Transportation Needs: Not on file  Physical Activity: Not on file  Stress: Not on file  Social Connections: Not on file    Her Allergies Are:  Allergies  Allergen Reactions   Methocarbamol Itching   Penicillins     Yeast infections Has patient had a PCN reaction causing immediate rash, facial/tongue/throat swelling, SOB or lightheadedness with hypotension: No Has patient had a PCN reaction causing severe rash involving mucus membranes or skin necrosis: No  Has patient had a PCN reaction that required hospitalization No Has patient had a PCN reaction occurring within the last 10 years: No If all of the above answers are "NO", then may proceed with Cephalosporin use.    Tramadol Other (See Comments)    Loopy and confused   Latex Rash  :   Her Current Medications Are:  Outpatient Encounter Medications as of 05/21/2022  Medication Sig   acetaminophen (TYLENOL) 650 MG CR tablet Take 500 mg by mouth every 8 (eight) hours as needed for pain. 2 tablets   albuterol (VENTOLIN HFA) 108 (90 Base) MCG/ACT inhaler Inhale 2 puffs into the lungs every 6 (six) hours as needed for wheezing.   ALPRAZolam (NIRAVAM) 0.25 MG dissolvable tablet Take 0.25 mg by mouth daily as needed for anxiety.   NON FORMULARY Pt uses a cpap nightly   ondansetron (ZOFRAN-ODT) 4 MG disintegrating tablet Take 1 tablet (4 mg total) by mouth every 6 (six) hours as needed for nausea or vomiting.   orphenadrine (NORFLEX) 100 MG tablet Take 100 mg by mouth 2 (two) times daily as needed for muscle spasms.   oxyCODONE (OXY IR/ROXICODONE) 5 MG immediate release tablet Take 1 tablet (5 mg total) by mouth every 6 (six) hours as needed for severe pain.   pantoprazole (PROTONIX) 40 MG tablet Take 1 tablet (40 mg total) by mouth daily.   metroNIDAZOLE (FLAGYL) 500 MG tablet Take 1  tablet (500 mg total) by mouth 2 (two) times daily. (Patient not taking: Reported on 05/21/2022)   No facility-administered encounter medications on file as of 05/21/2022.  :  Review of Systems:  Out of a complete 14 point review of systems, all are reviewed and negative with the exception of these symptoms as listed below:  Review of Systems  Neurological:        Follow up for cpap.  ESS 6.  Has lost about 100lbs.      Objective:  Neurological Exam  Physical Exam Physical Examination:   Vitals:   05/21/22 1314  BP: 117/77  Pulse: 73    General Examination: The patient is a very pleasant 56 y.o. female in no acute distress. She appears well-developed and well-nourished and well groomed.   HEENT: Normocephalic, atraumatic, pupils are equal, round and reactive to light, extraocular tracking is good without limitation to gaze excursion or nystagmus noted. Hearing is grossly intact. Face is symmetric with normal facial animation. Speech is clear with no dysarthria noted, but edentulous speech. There is no hypophonia. There is no lip, neck/head, jaw or voice tremor. Neck is supple with full range of passive and active motion. There are no carotid bruits on auscultation. Oropharynx exam reveals: mild mouth dryness, edentulous on top and almost edentulous on the bottom.  Mild airway crowding noted secondary to small airway entry.  Tongue protrudes centrally and palate elevates symmetrically.    Chest: Clear to auscultation without wheezing, rhonchi or crackles noted.   Heart: S1+S2+0, regular and normal without murmurs, rubs or gallops noted.    Abdomen: Soft, non-tender and non-distended.   Extremities: There is no pitting edema in the distal lower extremities bilaterally.    Skin: Warm and dry without trophic changes noted.    Musculoskeletal: exam reveals no obvious joint deformity.     Neurologically:  Mental status: The patient is awake, alert and oriented in all 4 spheres.  Her immediate and remote memory, attention, language skills and fund of knowledge are appropriate. There is no evidence of aphasia,  agnosia, apraxia or anomia. Speech is clear with normal prosody and enunciation. Thought process is linear. Mood is normal and affect is normal.  Cranial nerves II - XII are as described above under HEENT exam.  Motor exam: Normal bulk, strength and tone is noted. There is no obvious tremor.  Fine motor skills and coordination: grossly intact.  Cerebellar testing: No dysmetria or intention tremor. There is no truncal or gait ataxia.  Sensory exam: intact to light touch in the upper and lower extremities.  Gait, station and balance: She stands with mild difficulty, she walks without a cane.     Assessment and Plan:    In summary, KANI JOBSON is a very pleasant 56 year old female with an underlying medical history of reflux disease, umbilical hernia, endometriosis, anemia, depression, allergic rhinitis, osteoarthritis, hyperlipidemia, and obesity with status post gastric bypass in March 2023 with significant weight loss achieved, who presents for follow-up consultation of her obstructive sleep apnea.  She has achieved over 100 pounds of weight loss since her weight loss surgery.  Her home sleep test in August 2022 showed mild sleep apnea.  She has not been using her AutoPap consistently and would be willing to get retested with a home sleep test.  We will call her with her test results and take it from there.  We talked about the importance of maintaining a healthy lifestyle, good sleep hygiene and making enough time for sleep.  She is encouraged to try to make more time for sleep.  She is encouraged to stay well-hydrated as well.  Depending on her home sleep test results, we will follow-up in this clinic if needed. I answered all her questions today and she was in agreement with our plan. I spent 30 minutes in total face-to-face time and in reviewing records during  pre-charting, more than 50% of which was spent in counseling and coordination of care, reviewing test results, reviewing medications and treatment regimen and/or in discussing or reviewing the diagnosis of OSA, the prognosis and treatment options. Pertinent laboratory and imaging test results that were available during this visit with the patient were reviewed by me and considered in my medical decision making (see chart for details).

## 2022-07-03 ENCOUNTER — Telehealth: Payer: Self-pay | Admitting: Neurology

## 2022-07-03 NOTE — Telephone Encounter (Signed)
HST- MCD Healthy blue No auth required ref# [id number]   Patient is scheduled at [facility] for 07/30/22 at 8 AM.  Mailed packet to the patient.

## 2022-07-15 ENCOUNTER — Ambulatory Visit: Payer: Medicaid Other | Admitting: Podiatry

## 2022-07-29 ENCOUNTER — Ambulatory Visit: Payer: Medicaid Other | Admitting: Podiatry

## 2022-07-30 ENCOUNTER — Ambulatory Visit: Payer: Medicaid Other | Admitting: Neurology

## 2022-07-30 DIAGNOSIS — Z9884 Bariatric surgery status: Secondary | ICD-10-CM

## 2022-07-30 DIAGNOSIS — R634 Abnormal weight loss: Secondary | ICD-10-CM

## 2022-07-30 DIAGNOSIS — G4733 Obstructive sleep apnea (adult) (pediatric): Secondary | ICD-10-CM | POA: Diagnosis not present

## 2022-07-30 DIAGNOSIS — R0683 Snoring: Secondary | ICD-10-CM

## 2022-08-01 NOTE — Progress Notes (Signed)
See procedure note.

## 2022-08-07 NOTE — Procedures (Signed)
   [facility]  HOME SLEEP TEST (Watch PAT) REPORT  STUDY DATE: 07/30/2022  DOB: [birth year]  MRN: [id number]  ORDERING CLINICIAN: Star Age, MD, PhD   REFERRING CLINICIAN: Dr. Louanna Raw   CLINICAL INFORMATION/HISTORY: 57 year old right-handed woman with an underlying medical history of reflux disease, umbilical hernia, endometriosis, anemia, depression, allergic rhinitis, osteoarthritis, hyperlipidemia, and morbid obesity with a BMI of over 50, who presents for reevaluation of her sleep apnea.  She had a home sleep test in August 2022 which showed mild obstructive sleep apnea.  She has achieved significant interim weight loss of over 100 pounds after her bariatric surgery.  Epworth sleepiness score: 6/24.  BMI: 38.5 kg/m  FINDINGS:   Sleep Summary:   Total Recording Time (hours, min): 5 hours, 47 min  Total Sleep Time (hours, min):  5 hours, 7 min  Percent REM (%):    36.4%   Respiratory Indices:   Calculated pAHI (per hour):  4.6/hour         REM pAHI:    12/hour       NREM pAHI: 2.2/hour  Central pAHI: 0/hour  Oxygen Saturation Statistics:    Oxygen Saturation (%) Mean: 96%   Minimum oxygen saturation (%):                 82%   O2 Saturation Range (%): 82-100%    O2 Saturation (minutes) <=88%: 0.1 min  Pulse Rate Statistics:   Pulse Mean (bpm):    82/min    Pulse Range (64-123/min)   IMPRESSION: Primary snoring   RECOMMENDATION:  This home sleep test does not demonstrate any significant residual obstructive or central sleep disordered breathing with a total AHI of less than 5/h, her AHI was 4.6/h, O2 nadir was 82% (briefly), without any significant time below or at 88% saturation for the night.  Snoring was intermittent in the mild to moderate range.  Treatment with a positive airway pressure device is no longer necessary or recommended.  The patient has not been using her AutoPap machine and may forego treatment at this time.    For disturbing snoring, an oral appliance through dentistry or orthodontics can be considered.  Other causes of the patient's symptoms, including circadian rhythm disturbances, an underlying mood disorder, medication effect and/or an underlying medical problem cannot be ruled out based on this test. Clinical correlation is recommended.  The patient should be cautioned not to drive, work at heights, or operate dangerous or heavy equipment when tired or sleepy. Review and reiteration of good sleep hygiene measures should be pursued with any patient. The patient will be notified of the test results.  She can follow-up with her current providers.   I certify that I have reviewed the raw data recording prior to the issuance of this report in accordance with the standards of the American Academy of Sleep Medicine (AASM).  INTERPRETING PHYSICIAN:   Star Age, MD, PhD Medical Director, [facility]) [place], ABPN (Neurology and Sleep)   [facility] [address] [place], [place] [address] [phone]

## 2022-08-08 ENCOUNTER — Telehealth: Payer: Self-pay | Admitting: *Deleted

## 2022-08-08 NOTE — Telephone Encounter (Signed)
Attempted to reach pt twice. Received message stating call could not completed. I sent a mychart message to the patient's active account (last login 08/07/22).

## 2022-08-08 NOTE — Telephone Encounter (Signed)
-----   Message from Star Age, MD sent at 08/07/2022  4:32 PM EST ----- Patient had a home sleep test on 07/30/2022 for reevaluation of her sleep apnea.  She is achieved a significant amount of weight loss after weight loss surgery.  Please advise patient that her recent home sleep test did not show any significant residual sleep apnea.  She did not have an AHI above 5/h, it was 4.6/h, brief oxygen desaturation to 82% was seen but no significant amount of time below 88% saturation.  She did have intermittent snoring.  She can forego AutoPap therapy if she would like.  She can follow-up with [place] on an as-needed basis.  If she would like a referral to a dentist for dental device for snoring, we can certainly facilitate with a referral.  If she needs to return her AutoPap machine to the DME provider, she will have to be in touch with them.  We can send a DC order to the DME provider if she would like to get an official order for discontinuation of AutoPap therapy.

## 2022-08-21 ENCOUNTER — Encounter: Payer: Federal, State, Local not specified - PPO | Attending: Surgery | Admitting: Skilled Nursing Facility1

## 2022-08-21 ENCOUNTER — Encounter: Payer: Self-pay | Admitting: Skilled Nursing Facility1

## 2022-08-21 VITALS — Wt 231.9 lb

## 2022-08-21 DIAGNOSIS — E669 Obesity, unspecified: Secondary | ICD-10-CM | POA: Insufficient documentation

## 2022-08-21 DIAGNOSIS — Z713 Dietary counseling and surveillance: Secondary | ICD-10-CM | POA: Insufficient documentation

## 2022-08-21 NOTE — Progress Notes (Signed)
Bariatric Nutrition Follow-Up Visit Medical Nutrition Therapy    Surgery date: 08/21/2021 Surgery type: Gastric Bypass Roux-En-Y Start weight at [facility]: 334.3 Weight today: 231.9 pounds   Body Composition Scale Date 09/04/2021 01/22/2022 04/16/2022 08/21/2022  Current Body Weight 303.0 247.3 236.6 231.9  Total Body Fat % 49.8 45 43.9 43.4  Visceral Fat 23 16 15    Fat-Free Mass % 50.1 54.9 56    Total Body Water % 39.5 41.9 42.5 42.7  Muscle-Mass lbs 31.7 31.7 31.6   BMI 50.1 39.9 38.1   Body Fat Displacement             Torso  lbs 93.8 69.1 64.4          Left Leg  lbs 18.7 13.8 12.8          Right Leg  lbs 18.7 13.8 12.8          Left Arm  lbs 9.3 6.9 6.4          Right Arm   lbs 9.3 6.9 6.4     Clinical  Medical hx: reflux Medications: see list; currently on an antibiotic  Labs: Potassium 3.3, LDL cholesterol 126, Vitamin D 29 Notable signs/symptoms: knee pain  Any previous deficiencies? Yes: vitamin D, iron    Lifestyle & Dietary Hx  Pt states she is bored with her foods.  Pt states she is hopeful to get lose skin surgery.  Pt states she does not like room temp water it has to be very cold to like it.  Pt states she is having cravings coming back and a bit of struggles with stress.    Estimated daily fluid intake: 40 oz Estimated daily protein intake: 80 g Supplements: multi and calcium  Current average weekly physical activity: 3 days a week 60 minutes: treadmill, elliptical, bike, resistance  24-Hr Dietary Recall: eating out about 2 times a week First Meal 8am: scrambled egg and sausage or bacon or grits Snack: orange or cheese or grapes Second Meal 12: chicken + greens  Snack: greek yogurt Third Meal: chicken + green beans + mashed potato or fried or baked fish Snack:  Beverages:  water + protein shot, coffee, diet green tea  Post-Op Goals/ Signs/ Symptoms Using straws: no Drinking while eating: no Chewing/swallowing difficulties: no Changes in vision:  no Changes to mood/headaches: no Hair loss/changes to skin/nails: no Difficulty focusing/concentrating: no Sweating: no Limb weakness: no Dizziness/lightheadedness: no Palpitations: no  Carbonated/caffeinated beverages: no N/V/D/C/Gas: 2 bowel movements a day  Abdominal pain: no Dumping syndrome: no    NUTRITION DIAGNOSIS  Overweight/obesity ([place]-3.3) related to past poor dietary habits and physical inactivity as evidenced by completed bariatric surgery and following dietary guidelines for continued weight loss and healthy nutrition status.     NUTRITION INTERVENTION: continued  Nutrition counseling (C-1) and education (E-2) to facilitate bariatric surgery goals, including: The importance of consuming adequate calories as well as certain nutrients daily due to the body's need for essential vitamins, minerals, and fats The importance of daily physical activity and to reach a goal of at least 150 minutes of moderate to vigorous physical activity weekly (or as directed by their physician) due to benefits such as increased musculature and improved lab values The importance of intuitive eating specifically learning hunger-satiety cues and understanding the importance of learning a new body: The importance of mindful eating to avoid grazing behaviors  RE-Educated pt on other fluid options to help her get in her fluid goal Encouraged patient to honor  their body's internal hunger and fullness cues.  Throughout the day, check in mentally and rate hunger. Stop eating when satisfied not full regardless of how much food is left on the plate.  Get more if still hungry 20-30 minutes later.  The key is to honor satisfaction so throughout the meal, rate fullness factor and stop when comfortably satisfied not physically full. The key is to honor hunger and fullness without any feelings of guilt or shame.  Pay attention to what the internal cues are, rather than any external factors. This will enhance the  confidence you have in listening to your own body and following those internal cues enabling you to increase how often you eat when you are hungry not out of appetite and stop when you are satisfied not full.  Encouraged pt to continue to eat balanced meals inclusive of non starchy vegetables 2 times a day 7 days a week Encouraged pt to choose lean protein sources: limiting beef, pork, sausage, hotdogs, and lunch meat Encourage pt to choose healthy fats such as plant based limiting animal fats Encouraged pt to continue to drink a minium 64 fluid ounces with half being plain water to satisfy proper hydration  Creation of balanced and diverse meals to increase the intake of nutrient-rich foods that provide essential vitamins, minerals, fiber, and phytonutrients Variety of Fruits and Vegetables: Aim for a colorful array of fruits and vegetables to ensure a wide range of nutrients. Include a mix of leafy greens, berries, citrus fruits, cruciferous vegetables, and more. Whole Grains: Choose whole grains over refined grains. Examples include brown rice, quinoa, oats, whole wheat, and barley. Lean Proteins: Include lean sources of protein, such as poultry, fish, tofu, legumes, beans, lentils, and low-fat dairy products. Limit red and processed meats. Healthy Fats: Incorporate sources of healthy fats, including avocados, nuts, seeds, and olive oil. Limit saturated and trans fats found in fried and processed foods. Dairy or Dairy Alternatives: Choose low-fat or fat-free dairy products, or plant-based alternatives like almond or soy milk. Portion Control: Be mindful of portion sizes to avoid overeating. Pay attention to hunger and satisfaction cues. Limit Added Sugars: Minimize the consumption of sugary beverages, snacks, and desserts. Check food labels for added sugars and opt for natural sources of sweetness such as whole fruits. Hydration: Drink plenty of water throughout the day. Limit sugary  drinks and excessive caffeine intake. Moderate Sodium Intake: Reduce the consumption of high-sodium foods. Use herbs and spices for flavor instead of excessive salt. Meal Planning and Preparation: Plan and prepare meals ahead of time to make healthier choices more convenient. Include a mix of food groups in each meal. Limit Processed Foods: Minimize the intake of highly processed and packaged foods that are often high in added sugars, salt, and unhealthy fats. Regular Physical Activity: Combine a healthy diet with regular physical activity for overall well-being. Aim for at least 150 minutes of moderate-intensity aerobic exercise per week, along with strength training. Moderation and Balance: Enjoy treats and indulgent foods in moderation, emphasizing balance rather than strict restriction   Handouts Previously Provided Include  Phase 7  Learning Style & Readiness for Change Teaching method utilized: Visual & Auditory  Demonstrated degree of understanding via: Teach Back  Readiness Level: action Barriers to learning/adherence to lifestyle change: cravings  RD's Notes for Next Visit Assess adherence to pt chosen goals    MONITORING & EVALUATION Dietary intake, weekly physical activity, body weight  Next Steps Patient is to follow-up in 3-4 months

## 2022-12-24 ENCOUNTER — Ambulatory Visit: Payer: Federal, State, Local not specified - PPO | Admitting: Skilled Nursing Facility1

## 2023-03-17 ENCOUNTER — Other Ambulatory Visit: Payer: Self-pay | Admitting: Family Medicine

## 2023-03-17 DIAGNOSIS — Z1231 Encounter for screening mammogram for malignant neoplasm of breast: Secondary | ICD-10-CM

## 2023-04-03 ENCOUNTER — Ambulatory Visit
Admission: RE | Admit: 2023-04-03 | Discharge: 2023-04-03 | Disposition: A | Discharge status: Home / Self Care | Payer: Federal, State, Local not specified - PPO | Source: Ambulatory Visit | Attending: Family Medicine | Admitting: Family Medicine

## 2023-04-03 DIAGNOSIS — Z1231 Encounter for screening mammogram for malignant neoplasm of breast: Secondary | ICD-10-CM

## 2023-05-15 ENCOUNTER — Ambulatory Visit: Payer: Federal, State, Local not specified - PPO

## 2023-05-15 ENCOUNTER — Other Ambulatory Visit (HOSPITAL_COMMUNITY)
Admission: RE | Admit: 2023-05-15 | Discharge: 2023-05-15 | Disposition: A | Discharge status: Home / Self Care | Payer: Federal, State, Local not specified - PPO | Source: Ambulatory Visit

## 2023-05-15 VITALS — BP 118/77 | HR 87 | Ht 65.5 in | Wt 249.0 lb

## 2023-05-15 DIAGNOSIS — N898 Other specified noninflammatory disorders of vagina: Secondary | ICD-10-CM

## 2023-05-15 DIAGNOSIS — Z0000 Encounter for general adult medical examination without abnormal findings: Secondary | ICD-10-CM

## 2023-05-15 DIAGNOSIS — Z01419 Encounter for gynecological examination (general) (routine) without abnormal findings: Secondary | ICD-10-CM | POA: Diagnosis present

## 2023-05-15 NOTE — Progress Notes (Signed)
Pt presents for AEX. Pt reports amenorrhea for over 1 year. Mammogram 04/07/23. Colonoscopy up to date. Pt reports vaginal irritation and recurring vaginal odor

## 2023-05-15 NOTE — Progress Notes (Signed)
GYNECOLOGY OFFICE VISIT NOTE-WELL WOMAN EXAM  History:   Charlotte Meyer is a 57 year old female here today for well woman exam.  Sports reports continued vaginal itching and discharge, which has been an ongoing issue.  She does note that her symptoms started after using certain soaps and wearing polyester underwear.  She also reports odor with intake of certain foods.  She denies abnormal vaginal bleeding or pelvic pain.  Birth Control: Postmenopausal.  Reproductive Concerns Sexually Active: Occasionally Partners Type: Female Number of partners in last year: 1 STD Testing: Desires GC/CT  Obstetrical History: G1P1001  Gynecological History: Not assessed Vaginal/GU Concerns: Concerns as above. No pain or discomfort during sexual activity.  Breast Concerns/Exams:No concerns. Mammogram UTD. Maternal aunt, with breast CA, remains alive, but now on dialysis.  She denies family history of other breast, uterine, cervical, or ovarian cancer  Medical and Nutrition PCP: Charlotte Meyer.  An appointment on Monday. Significant PMx: Obesity with gastric bypass in March 2023 Exercise: 2-3x/week. Cardio and Core.  Now working with a Social research officer, government. Tobacco/Drugs/Alcohol: None Nutrition: Charlotte Meyer continues to feel that her nutritional intake is unbalanced and desires to see nutritionist.  She reports a decreased interest in meat.  Social Safety at home: Endorses. Lives with fiance and daughter. DV/A: Denies Social Support: Endorses Employment: Receptionist. Reports some work stress, but is coping well.   Past Medical History:  Diagnosis Date   Allergic rhinitis    Anemia    Depression    treated with zoloft in past   GERD (gastroesophageal reflux disease)    History of UTI    Hyperlipemia    no treatment required   Lactose intolerance    OA (osteoarthritis) of knee    bilaterally    Obesity     Past Surgical History:  Procedure Laterality Date   BARIATRIC SURGERY   08/21/2021   BIOPSY  04/03/2021   Procedure: BIOPSY;  Surgeon: Kathi Der, MD;  Location: [facility];  Service: Gastroenterology;;   BREAST BIOPSY Left    CHOLECYSTECTOMY N/A 09/12/2015   Procedure: LAPAROSCOPIC CHOLECYSTECTOMY WITH INTRAOPERATIVE CHOLANGIOGRAM;  Surgeon: Avel Peace, MD;  Location: [facility];  Service: General;  Laterality: N/A;   COLONOSCOPY WITH PROPOFOL N/A 03/08/2016   Procedure: COLONOSCOPY WITH PROPOFOL;  Surgeon: Kathi Der, MD;  Location: [facility];  Service: Gastroenterology;  Laterality: N/A;   COLONOSCOPY WITH PROPOFOL N/A 04/03/2021   Procedure: COLONOSCOPY WITH PROPOFOL;  Surgeon: Kathi Der, MD;  Location: [facility];  Service: Gastroenterology;  Laterality: N/A;   ESOPHAGOGASTRODUODENOSCOPY N/A 04/03/2021   Procedure: ESOPHAGOGASTRODUODENOSCOPY (EGD);  Surgeon: Kathi Der, MD;  Location: [facility];  Service: Gastroenterology;  Laterality: N/A;   FOOT SURGERY     POLYPECTOMY  04/03/2021   Procedure: POLYPECTOMY;  Surgeon: Kathi Der, MD;  Location: [facility];  Service: Gastroenterology;;   UMBILICAL HERNIA REPAIR N/A 08/21/2021   Procedure: HERNIA REPAIR UMBILICAL ADULT;  Surgeon: Quentin Ore, MD;  Location: [facility];  Service: General;  Laterality: N/A;   WISDOM TOOTH EXTRACTION      The following portions of the patient's history were reviewed and updated as appropriate: allergies, current medications, past family history, past medical history, past social history, past surgical history and problem list.   Health Maintenance: Pap: 10/2020 Date, NILM, HPV Neg Results.  Mammogram: 04/07/2023-Negative BiRad 1.  Colonoscopy: 04/03/2021 Review of Systems:  Pertinent items noted in HPI and remainder of comprehensive ROS otherwise negative.    Objective:  Physical Exam BP 118/77   Pulse 87   Ht 5' 5.5" (1.664 m)   Wt 249 lb (112.9 kg)   LMP 07/15/2021 (Exact Date) Comment: urine preg.negative 08/21/21   BMI 40.81 kg/m  Physical Exam Vitals and nursing note reviewed.  Constitutional:      Appearance: Normal appearance.  HENT:     Head: Normocephalic and atraumatic.  Eyes:     Conjunctiva/sclera: Conjunctivae normal.  Cardiovascular:     Rate and Rhythm: Normal rate and regular rhythm.     Heart sounds: Normal heart sounds.  Pulmonary:     Effort: Pulmonary effort is normal. No respiratory distress.     Breath sounds: Normal breath sounds.  Musculoskeletal:        General: Normal range of motion.     Cervical back: Normal range of motion.  Skin:    General: Skin is warm and dry.  Neurological:     Mental Status: She is alert and oriented to person, place, and time.  Psychiatric:        Mood and Affect: Mood normal.        Behavior: Behavior normal.      Labs and Imaging No results found for this or any previous visit (from the past 168 hour(s)). No results found.   Assessment & Plan:  57 year old Female Well Woman Exam Vaginal Itching/Discharge H/o Recurrent Yeast and BV infections  1. Well woman exam without gynecological exam -Exam performed. -Encouraged to utilize Mychart for reviewing of results, communication with office, and scheduling of appts. -Encouraged to continue moderate to exercise vigorous activity at least 5x/week. -Educated and encouraged to continue SBE with increased breast awareness including examination of breast for skin changes, moles, tenderness, etc.   - Cervicovaginal ancillary only( [place])  2. Vaginal itching -Discussed treatment for recurrent yeast including diflucan and boric acid. -Reviewed boric acid usage and information placed in AVS. -Script for Diflucan sent to pharmacy on file for recurrent yeast.    - Cervicovaginal ancillary only( [place])  3. Vaginal discharge -Discussed treatment for recurrent BV. -Will send script for Tinidazole and will have patient follow with boric acid usage. - Cervicovaginal ancillary  only( )   Routine preventative health maintenance measures emphasized. Please refer to After Visit Summary for other counseling recommendations.   No follow-ups on file.      Charlotte Meyer, CNM 05/15/2023

## 2023-05-17 MED ORDER — FLUCONAZOLE 150 MG PO TABS
150.0000 mg | ORAL_TABLET | ORAL | 2 refills | Status: DC
Start: 2023-05-17 — End: 2024-03-12

## 2023-05-17 MED ORDER — TINIDAZOLE 500 MG PO TABS
1.0000 g | ORAL_TABLET | Freq: Every day | ORAL | 4 refills | Status: AC
Start: 2023-05-17 — End: ?

## 2023-05-17 NOTE — Patient Instructions (Signed)
Boric Acid Vaginal Suppositories  What is this medication? BORIC ACID (BOHR ik AS id) may support vaginal health. It may relieve the symptoms of a yeast and bacterial infection, such as itching, burning, and odor. COMMON BRAND NAME(S): AZO Boric Acid with Aloe Vera, Hylafem  How should I use this medication? This medication is for use in the vagina. Do not take by mouth. Wash hands before and after use. Use this medication at bedtime, unless otherwise directed. For recurrent Bacterial Vaginosis, after initial treatment with an anti-infective you should start your Boric Acid suppositories.  Insert one 600mg  suppository in the vagina every night for 30 days and then once weekly for 9-12 weeks. After treatment if you have symptoms, treat with Boric Acid suppository for 3-5 days. If symptoms persist or return shortly after, contact the office for an appointment.  Boric acid can cause death if consumed orally. Therefore you should use barrier methods during oral sex when undergoing treatment.   What if I miss a dose? If you miss a dose, use it as soon as you can. If it is almost time for your next dose, use only that dose. Do not use double or extra doses.  What may interact with this medication? Interactions are not expected. Do not use any other vaginal products without telling your care team. This list may not describe all possible interactions. Give your health care provider a list of all the medicines, herbs, non-prescription drugs, or dietary supplements you use. Also tell them if you smoke, drink alcohol, or use illegal drugs. Some items may interact with your medicine.  What should I watch for while using this medication? Tell your care team if your symptoms do not start to get better within a few days. It is better not to have sex until you have finished your treatment. This medication may cause condoms, diaphragms, and spermicides to not work as well. Do not rely on any of these methods to  prevent sexually transmitted infections (STIs) or pregnancy while you are using this medication. Vaginal medications may come out of the vagina during treatment. To keep the medication from getting on your clothing, wear a panty liner, but change frequently to avoid concurrent infections.  The use of tampons is not recommended. To help clear up the infection, wear freshly washed cotton, not synthetic, underwear.  What side effects may I notice from receiving this medication? Side effects that you should report to your care team as soon as possible: Allergic reactions--skin rash, itching, hives, swelling of the face, lips, tongue, or throat Unusual vaginal discharge, itching, or odor Side effects that usually do not require medical attention (report to your care team if they continue or are bothersome): Vaginal irritation at the application site This list may not describe all possible side effects. Call your doctor for medical advice about side effects. You may report side effects to FDA at 1-800-FDA-1088.  Where should I keep my medication? Keep out of the reach of children and pets. Store in a cool, dry place between 15 and 30 degrees C (59 and 86 degrees F). Keep away from sunlight. Throw away any unused medication after the expiration date. NOTE: This sheet is a summary. It may not cover all possible information. If you have questions about this medicine, talk to your doctor, pharmacist, or health care provider.  2023 Elsevier/Gold Standard (2021-03-20 00:00:00)

## 2023-05-19 LAB — CERVICOVAGINAL ANCILLARY ONLY
Bacterial Vaginitis (gardnerella): NEGATIVE
Candida Glabrata: NEGATIVE
Candida Vaginitis: NEGATIVE
Chlamydia: NEGATIVE
Comment: NEGATIVE
Comment: NEGATIVE
Comment: NEGATIVE
Comment: NEGATIVE
Comment: NEGATIVE
Comment: NORMAL
Neisseria Gonorrhea: NEGATIVE
Trichomonas: NEGATIVE

## 2024-02-24 ENCOUNTER — Other Ambulatory Visit: Payer: Self-pay | Admitting: Family Medicine

## 2024-02-24 DIAGNOSIS — Z1231 Encounter for screening mammogram for malignant neoplasm of breast: Secondary | ICD-10-CM

## 2024-03-07 ENCOUNTER — Other Ambulatory Visit: Payer: Self-pay

## 2024-04-05 ENCOUNTER — Ambulatory Visit
Admission: RE | Admit: 2024-04-05 | Discharge: 2024-04-05 | Disposition: A | Source: Ambulatory Visit | Attending: Family Medicine

## 2024-04-05 DIAGNOSIS — Z1231 Encounter for screening mammogram for malignant neoplasm of breast: Secondary | ICD-10-CM
